# Patient Record
Sex: Female | Born: 1935
Health system: Southern US, Community
[De-identification: ages and names within clinical notes are randomized; demographics above are authoritative.]

## PROBLEM LIST (undated history)

## (undated) ENCOUNTER — Emergency Department (HOSPITAL_BASED_OUTPATIENT_CLINIC_OR_DEPARTMENT_OTHER): Admission: EM | Payer: Self-pay | Source: Home / Self Care

## (undated) DIAGNOSIS — E559 Vitamin D deficiency, unspecified: Secondary | ICD-10-CM

## (undated) DIAGNOSIS — E119 Type 2 diabetes mellitus without complications: Secondary | ICD-10-CM

## (undated) DIAGNOSIS — D497 Neoplasm of unspecified behavior of endocrine glands and other parts of nervous system: Secondary | ICD-10-CM

## (undated) DIAGNOSIS — E0500 Thyrotoxicosis with diffuse goiter without thyrotoxic crisis or storm: Secondary | ICD-10-CM

## (undated) DIAGNOSIS — M1990 Unspecified osteoarthritis, unspecified site: Secondary | ICD-10-CM

## (undated) DIAGNOSIS — G4700 Insomnia, unspecified: Secondary | ICD-10-CM

## (undated) DIAGNOSIS — I10 Essential (primary) hypertension: Secondary | ICD-10-CM

## (undated) DIAGNOSIS — I639 Cerebral infarction, unspecified: Secondary | ICD-10-CM

## (undated) HISTORY — PX: NASAL SEPTUM SURGERY: SHX37

## (undated) HISTORY — PX: APPENDECTOMY: SHX54

## (undated) HISTORY — DX: Vitamin D deficiency, unspecified: E55.9

## (undated) HISTORY — DX: Type 2 diabetes mellitus without complications: E11.9

## (undated) HISTORY — DX: Insomnia, unspecified: G47.00

## (undated) HISTORY — DX: Cerebral infarction, unspecified: I63.9

## (undated) HISTORY — DX: Thyrotoxicosis with diffuse goiter without thyrotoxic crisis or storm: E05.00

---

## 1997-08-27 ENCOUNTER — Other Ambulatory Visit: Admission: RE | Admit: 1997-08-27 | Discharge: 1997-08-27 | Payer: Self-pay | Admitting: Obstetrics and Gynecology

## 1998-02-27 DIAGNOSIS — E0500 Thyrotoxicosis with diffuse goiter without thyrotoxic crisis or storm: Secondary | ICD-10-CM

## 1998-02-27 HISTORY — DX: Thyrotoxicosis with diffuse goiter without thyrotoxic crisis or storm: E05.00

## 2012-02-06 ENCOUNTER — Emergency Department (HOSPITAL_BASED_OUTPATIENT_CLINIC_OR_DEPARTMENT_OTHER)
Admission: EM | Admit: 2012-02-06 | Discharge: 2012-02-06 | Disposition: A | Discharge status: Home / Self Care | Payer: Medicare Other | Attending: Emergency Medicine | Admitting: Emergency Medicine

## 2012-02-06 ENCOUNTER — Encounter (HOSPITAL_BASED_OUTPATIENT_CLINIC_OR_DEPARTMENT_OTHER): Payer: Self-pay | Admitting: *Deleted

## 2012-02-06 ENCOUNTER — Emergency Department (HOSPITAL_BASED_OUTPATIENT_CLINIC_OR_DEPARTMENT_OTHER): Payer: Medicare Other

## 2012-02-06 DIAGNOSIS — R6889 Other general symptoms and signs: Secondary | ICD-10-CM | POA: Insufficient documentation

## 2012-02-06 DIAGNOSIS — M129 Arthropathy, unspecified: Secondary | ICD-10-CM | POA: Insufficient documentation

## 2012-02-06 DIAGNOSIS — R059 Cough, unspecified: Secondary | ICD-10-CM | POA: Insufficient documentation

## 2012-02-06 DIAGNOSIS — J029 Acute pharyngitis, unspecified: Secondary | ICD-10-CM | POA: Insufficient documentation

## 2012-02-06 DIAGNOSIS — J069 Acute upper respiratory infection, unspecified: Secondary | ICD-10-CM

## 2012-02-06 DIAGNOSIS — R498 Other voice and resonance disorders: Secondary | ICD-10-CM | POA: Insufficient documentation

## 2012-02-06 DIAGNOSIS — Z862 Personal history of diseases of the blood and blood-forming organs and certain disorders involving the immune mechanism: Secondary | ICD-10-CM | POA: Insufficient documentation

## 2012-02-06 DIAGNOSIS — Z8639 Personal history of other endocrine, nutritional and metabolic disease: Secondary | ICD-10-CM | POA: Insufficient documentation

## 2012-02-06 DIAGNOSIS — R05 Cough: Secondary | ICD-10-CM | POA: Insufficient documentation

## 2012-02-06 DIAGNOSIS — I10 Essential (primary) hypertension: Secondary | ICD-10-CM | POA: Insufficient documentation

## 2012-02-06 DIAGNOSIS — R0982 Postnasal drip: Secondary | ICD-10-CM | POA: Insufficient documentation

## 2012-02-06 HISTORY — DX: Unspecified osteoarthritis, unspecified site: M19.90

## 2012-02-06 HISTORY — DX: Essential (primary) hypertension: I10

## 2012-02-06 MED ORDER — AZITHROMYCIN 250 MG PO TABS: 250.0000 mg | ORAL_TABLET | Freq: Every day | ORAL | Status: DC

## 2012-02-06 MED ORDER — OXYMETAZOLINE HCL 0.05 % NA SOLN
1.0000 | Freq: Once | NASAL | Status: AC
  Administered 2012-02-06: 1 via NASAL
  Filled 2012-02-06: qty 15

## 2012-02-06 MED ORDER — GUAIFENESIN ER 600 MG PO TB12: 1200.0000 mg | ORAL_TABLET | Freq: Two times a day (BID) | ORAL | Status: DC

## 2012-02-06 MED ORDER — HYDROCODONE-HOMATROPINE 5-1.5 MG/5ML PO SYRP: 5.0000 mL | ORAL_SOLUTION | Freq: Four times a day (QID) | ORAL | Status: DC | PRN

## 2012-02-06 MED ORDER — IPRATROPIUM BROMIDE 0.03 % NA SOLN: 2.0000 | Freq: Two times a day (BID) | NASAL | Status: DC

## 2012-02-06 NOTE — ED Provider Notes (Signed)
Medical screening examination/treatment/procedure(s) were performed by non-physician practitioner and as supervising physician I was immediately available for consultation/collaboration.    Charles B. Sheldon, MD 02/06/12 2326 

## 2012-02-06 NOTE — ED Provider Notes (Signed)
History     CSN: [id number]  Arrival date & time 02/06/12  1802   First MD Initiated Contact with Patient 02/06/12 1859      Chief Complaint  Patient presents with  . Nasal Congestion    (Consider location/radiation/quality/duration/timing/severity/associated sxs/prior treatment) The history is provided by the patient and medical records.    Amanda Curtis is a 76 y.o. female  with a hx of HTN presents to the Emergency Department complaining of gradual, persistent, progressively worsening cough onset 1 week ago.  Associated symptoms include cough, nasal congestion, sore throat, sneezing, runny nose, sinus pressure.  Nothing makes it better and nothing  makes it worse.  Pt denies fever, chills, headache, chest pain, shortness of breath, abdominal pain, nausea, vomiting, diarrhea, weakness, dizziness, syncope, dysuria, hematuria, urgency, frequency.  Pt has been taking mucinex without relief.         Past Medical History  Diagnosis Date  . Hypertension   . Thyroid disease   . Arthritis     Past Surgical History  Procedure Date  . Appendectomy     No family history on file.  History  Substance Use Topics  . Smoking status: Never Smoker   . Smokeless tobacco: Not on file  . Alcohol Use: No    OB History    Grav Para Term Preterm Abortions TAB SAB Ect Mult Living                  Review of Systems  Constitutional: Negative for fever, diaphoresis, appetite change, fatigue and unexpected weight change.  HENT: Positive for congestion, sore throat, rhinorrhea, sneezing, voice change, postnasal drip and sinus pressure. Negative for ear pain, mouth sores, neck pain and neck stiffness.   Eyes: Negative for visual disturbance.  Respiratory: Positive for cough. Negative for chest tightness, shortness of breath and wheezing.   Cardiovascular: Negative for chest pain.  Gastrointestinal: Negative for nausea, vomiting, abdominal pain, diarrhea and constipation.  Genitourinary:  Negative for dysuria, urgency, frequency and hematuria.  Musculoskeletal: Negative for back pain.  Skin: Negative for rash.  Neurological: Negative for syncope, weakness, light-headedness, numbness and headaches.  Hematological: Does not bruise/bleed easily.  Psychiatric/Behavioral: Negative for sleep disturbance. The patient is not nervous/anxious.   All other systems reviewed and are negative.    Allergies  Erythromycin  Home Medications   Current Outpatient Rx  Name  Route  Sig  Dispense  Refill  . AZITHROMYCIN 250 MG PO TABS   Oral   Take 1 tablet (250 mg total) by mouth daily. Take first 2 tablets together, then 1 every day until finished.   6 tablet   0   . GUAIFENESIN ER 600 MG PO TB12   Oral   Take 2 tablets (1,200 mg total) by mouth 2 (two) times daily.   20 tablet   0   . HYDROCODONE-HOMATROPINE 5-1.5 MG/5ML PO SYRP   Oral   Take 5 mLs by mouth every 6 (six) hours as needed for cough.   120 mL   0   . IPRATROPIUM BROMIDE 0.03 % NA SOLN   Nasal   Place 2 sprays into the nose 2 (two) times daily. PRN congestion   30 mL   0     BP 160/80  Pulse 84  Temp 98.6 F (37 C) (Oral)  Resp 16  SpO2 100%  Physical Exam  Nursing note and vitals reviewed. Constitutional: She is oriented to person, place, and time. She appears well-developed and well-nourished. No  distress.  HENT:  Head: Normocephalic and atraumatic.  Left Ear: Tympanic membrane, external ear and ear canal normal.  Nose: Mucosal edema and rhinorrhea present. No sinus tenderness. No epistaxis.  Mouth/Throat: Uvula is midline, oropharynx is clear and moist and mucous membranes are normal. No oropharyngeal exudate, posterior oropharyngeal edema, posterior oropharyngeal erythema or tonsillar abscesses.       Cerumen impaction in the right ear  Eyes: Conjunctivae normal are normal. No scleral icterus.  Neck: Normal range of motion. Neck supple.  Cardiovascular: Normal rate, regular rhythm, normal  heart sounds and intact distal pulses.  Exam reveals no gallop and no friction rub.   No murmur heard. Pulmonary/Chest: Effort normal and breath sounds normal. No respiratory distress. She has no wheezes. She has no rales. She exhibits no tenderness.  Abdominal: Soft. Bowel sounds are normal. She exhibits no distension and no mass. There is no tenderness. There is no rebound and no guarding.  Musculoskeletal: Normal range of motion. She exhibits no edema and no tenderness.  Lymphadenopathy:    She has no cervical adenopathy.  Neurological: She is alert and oriented to person, place, and time. She exhibits normal muscle tone. Coordination normal.       Speech is clear and goal oriented Moves extremities without ataxia  Skin: Skin is warm and dry. No rash noted. She is not diaphoretic. No erythema.  Psychiatric: She has a normal mood and affect.    ED Course  Procedures (including critical care time)  Labs Reviewed - No data to display Dg Chest 2 View  02/06/2012  *RADIOLOGY REPORT*  Clinical Data: Short of breath.  Cough and congestion.  CHEST - 2 VIEW  Comparison: None.  Findings: Cardiopericardial silhouette appears within normal limits.  There is no airspace disease.  No effusion.  Mild thoracic spondylosis is present.  Mediastinal contours are within normal limits.  IMPRESSION: No acute cardiopulmonary disease.   Original Report Authenticated By: Andreas Newport, M.D.      1. URI (upper respiratory infection)       MDM  [facility] presents with URI symptoms.  Pt CXR negative for acute infiltrate. Patients symptoms are consistent with URI, likely viral etiology. Discussed that antibiotics are not indicated for viral infections. Pt will be discharged with symptomatic treatment.  Verbalizes understanding and is agreeable with plan. Pt is hemodynamically stable & in NAD prior to dc.  1. Medications: atrovent, mucinex, hycodan usual home medications 2. Treatment: rest, drink plenty of  fluids, take medications as prescribed 3. Follow Up: Please followup with your primary doctor for discussion of your diagnoses and further evaluation after today's visit; if you do not have a primary care doctor use the resource guide provided to find one;        Dierdre Forth, PA-C 02/06/12 2227

## 2012-02-06 NOTE — ED Notes (Signed)
Nasal congestion, cough, laryngitis x 1 week. Has been taking Muccinex without relief.

## 2013-02-07 ENCOUNTER — Telehealth: Payer: Self-pay | Admitting: Family

## 2013-02-07 ENCOUNTER — Encounter: Payer: Self-pay | Admitting: Family

## 2013-02-07 ENCOUNTER — Ambulatory Visit (INDEPENDENT_AMBULATORY_CARE_PROVIDER_SITE_OTHER): Payer: Medicare Other | Admitting: Family

## 2013-02-07 VITALS — BP 180/90 | HR 56 | Temp 97.5°F | Resp 16 | Ht 65.5 in | Wt 159.1 lb

## 2013-02-07 DIAGNOSIS — E039 Hypothyroidism, unspecified: Secondary | ICD-10-CM

## 2013-02-07 DIAGNOSIS — I10 Essential (primary) hypertension: Secondary | ICD-10-CM

## 2013-02-07 DIAGNOSIS — Z8639 Personal history of other endocrine, nutritional and metabolic disease: Secondary | ICD-10-CM

## 2013-02-07 DIAGNOSIS — W57XXXA Bitten or stung by nonvenomous insect and other nonvenomous arthropods, initial encounter: Secondary | ICD-10-CM

## 2013-02-07 DIAGNOSIS — T148 Other injury of unspecified body region: Secondary | ICD-10-CM

## 2013-02-07 DIAGNOSIS — G4700 Insomnia, unspecified: Secondary | ICD-10-CM

## 2013-02-07 DIAGNOSIS — E119 Type 2 diabetes mellitus without complications: Secondary | ICD-10-CM

## 2013-02-07 DIAGNOSIS — Z862 Personal history of diseases of the blood and blood-forming organs and certain disorders involving the immune mechanism: Secondary | ICD-10-CM

## 2013-02-07 LAB — LIPID PANEL
HDL: 46 mg/dL (ref >39)
LDL Cholesterol: 156 mg/dL — ABNORMAL HIGH (ref 0–99)
Total CHOL/HDL Ratio: 5.3 Ratio
Triglycerides: 205 mg/dL — ABNORMAL HIGH (ref <150)
VLDL: 41 mg/dL — ABNORMAL HIGH (ref 0–40)

## 2013-02-07 LAB — BASIC METABOLIC PANEL WITH GFR
CO2: 26 mEq/L (ref 19–32)
Chloride: 105 mEq/L (ref 96–112)
Glucose, Bld: 99 mg/dL (ref 70–99)
Potassium: 5.1 mEq/L (ref 3.5–5.3)
Sodium: 139 mEq/L (ref 135–145)

## 2013-02-07 LAB — HEPATIC FUNCTION PANEL
ALT: 12 U/L (ref 0–35)
Alkaline Phosphatase: 83 U/L (ref 39–117)
Bilirubin, Direct: 0.1 mg/dL (ref 0.0–0.3)
Indirect Bilirubin: 0.3 mg/dL (ref 0.0–0.9)

## 2013-02-07 MED ORDER — AMLODIPINE BESYLATE 5 MG PO TABS: 5.0000 mg | ORAL_TABLET | Freq: Every day | ORAL | Status: DC

## 2013-02-07 MED ORDER — AMITRIPTYLINE HCL 25 MG PO TABS: 25.0000 mg | ORAL_TABLET | Freq: Every day | ORAL | Status: DC

## 2013-02-07 NOTE — Patient Instructions (Signed)
Please complete lab work prior to leaving. Follow up in 1 month for Medicare wellness visit. Welcome to [facility]!

## 2013-02-07 NOTE — Assessment & Plan Note (Signed)
Uncontrolled.  Add amlodipine, continue ARB. Obtain bmet.

## 2013-02-07 NOTE — Assessment & Plan Note (Signed)
Due for eye exam.  Will refer.  Continue tradjenta. Check A1C, urine microalbumin.

## 2013-02-07 NOTE — Telephone Encounter (Signed)
Prior auth form received onn amitriptyline hcl 25mg  Take 1 tab at bedtime Qty 30 PA form sent to nurse

## 2013-02-07 NOTE — Assessment & Plan Note (Signed)
Trial of elavil for insomnia and depression

## 2013-02-07 NOTE — Progress Notes (Signed)
Subjective:    Patient ID: Amanda Curtis, female    DOB: 1935/03/14, 77 y.o.   MRN: [id number]  HPI  Amanda Curtis is a 77 yr old female who presents today to establish care. She lived in [place]) for the last 15 years and is now returning to the area.  Insomnia  HTN- maintained on losartan 100 mg.  Reports BP is high "all the time."  She does not check her blood pressure at home.  Occasionally checks at drug stores.  Reports that when she checks there SBP runs 140-150's.  Reports that she has   DM2- reports that she was diagnosed 2 years ago.  Insomnia/depression- has tried [place] which did help her to sleep. Then this was discontinued due to her age and concerns re: increased fall risk.Tried zoloft which she did not tolerate.    Hypothyroid- Hx of grave's disease. S/p RIA- around 2000. Requests "severe hair loss." has started to grow back some.   Tick bite- reports tick bite between her breast.  Larey Seat off he chest, thinks it was a tick.   Review of Systems  Constitutional:       Has gained 10 pounds in last 6 months  HENT: Positive for rhinorrhea. Negative for postnasal drip.   Respiratory: Negative for cough and shortness of breath.   Cardiovascular: Negative for leg swelling.  Gastrointestinal: Negative for constipation.  Genitourinary: Negative for dysuria.  Musculoskeletal:       Notes bilateral leg soreness attributes to recent physical activity.    Skin:       Insect bite on chest  Neurological:       Reports mild headaches since starting trajenta  Psychiatric/Behavioral:       Reports stress, recent move, feels somewhat depressed. Feels lonely.     Past Medical History  Diagnosis Date  . Hypertension   . Arthritis   . Graves disease 2000    s/p RIA  . Diabetes mellitus without complication   . Insomnia     History   Social History  . Marital Status: Widowed    Spouse Name: N/A    Number of Children: N/A  . Years of Education: N/A    Occupational History  . Not on file.   Social History Main Topics  . Smoking status: Never Smoker   . Smokeless tobacco: Never Used  . Alcohol Use: Yes     Comment: 1 glass wine every 6 months  . Drug Use: No  . Sexual Activity: Not on file   Other Topics Concern  . Not on file   Social History Narrative   Widow   Retired worked for [employer] and did a lot of travelling for [employer].   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   She has 1 son- lives in [place]   3 grandsons- college age (one is in [place])             Past Surgical History  Procedure Laterality Date  . Appendectomy    . Nasal septum surgery      childhood    Family History  Problem Relation Age of Onset  . Dementia Mother   . Cancer Mother     breast  . Diabetes Father   . Diabetes Paternal Grandmother   . Hypertension Paternal Grandmother     Allergies  Allergen Reactions  . Erythromycin Rash  No current outpatient prescriptions on file prior to visit.   No current facility-administered medications on file prior to visit.    BP 180/90  Pulse 56  Temp(Src) 97.5 F (36.4 C) (Oral)  Resp 16  Ht 5' 5.5" (1.664 m)  Wt 159 lb 1.9 oz (72.176 kg)  BMI 26.07 kg/m2  SpO2 99%       Objective:   Physical Exam  Constitutional: She is oriented to person, place, and time. She appears well-developed and well-nourished. No distress.  HENT:  Head: Normocephalic and atraumatic.  Eyes: No scleral icterus.  Neck: No thyromegaly present.  Cardiovascular: Normal rate and regular rhythm.   No murmur heard. Pulmonary/Chest: Effort normal and breath sounds normal. No respiratory distress. She has no wheezes. She has no rales. She exhibits no tenderness.  Musculoskeletal: She exhibits no edema.  Lymphadenopathy:    She has no cervical adenopathy.  Neurological: She is alert and oriented to person, place, and  time.  Skin: Skin is warm and dry.  Thin hair noted on head Small round <1cm wide circular erythema lesion noted between breasts.  Small "bite" mark in center.  Psychiatric: She has a normal mood and affect. Her behavior is normal. Judgment and thought content normal.          Assessment & Plan:

## 2013-02-07 NOTE — Progress Notes (Signed)
Pre visit review using our clinic review tool, if applicable. No additional management support is needed unless otherwise documented below in the visit note. 

## 2013-02-07 NOTE — Assessment & Plan Note (Signed)
No sign of infection.  Pt advised to contact us if she develops joint pain, rash, fever.

## 2013-02-07 NOTE — Assessment & Plan Note (Signed)
Continue synthroid, obtain tsh.  

## 2013-02-08 LAB — MICROALBUMIN / CREATININE URINE RATIO: Creatinine, Urine: 121.1 mg/dL

## 2013-02-08 NOTE — Telephone Encounter (Signed)
Sugar is at goal.   

## 2013-02-10 MED ORDER — SYNTHROID 100 MCG PO TABS: 100.0000 ug | ORAL_TABLET | Freq: Every day | ORAL | Status: DC

## 2013-02-10 MED ORDER — LEVOTHYROXINE SODIUM 100 MCG PO TABS: 100.0000 ug | ORAL_TABLET | Freq: Every day | ORAL | Status: DC

## 2013-02-10 MED ORDER — ATORVASTATIN CALCIUM 20 MG PO TABS: 20.0000 mg | ORAL_TABLET | Freq: Every day | ORAL | Status: DC

## 2013-02-10 NOTE — Telephone Encounter (Signed)
Could you please send her copy of the Dash Diet. Thanks.

## 2013-02-10 NOTE — Telephone Encounter (Signed)
Notified pt and she states that she has not tolerated statins in the past as they have made her very weak and tired. Pt does not want to start it at this time and would like recommendations for a diet.  Also states that she has not tolerated the generic synthroid as well as the name brand due to increase hair loss while on the generic.  Called pharmacy and asked them to change rx to brand name. Lab order entered.  PA request has been faxed to [phone].  Pt aware.  Please advise.

## 2013-02-10 NOTE — Telephone Encounter (Signed)
Lab work shows that synthroid dose a bit too high. I am decreasing from 112 to 100 mcg once daily.  Kidney function is normal.  Cholesterol is high. Recommend that she start statin.  rx sent for atorvastatin. Follow up in 6 weeks for TSH (dx hypothyroid), FLP/LFT (dx hyperlipidemia)

## 2013-02-10 NOTE — Telephone Encounter (Signed)
Notified pt of P.A. Process. Form completed and forwarded to Provider for signature.

## 2013-02-12 MED ORDER — AMITRIPTYLINE HCL 25 MG PO TABS: 25.0000 mg | ORAL_TABLET | Freq: Every day | ORAL | Status: DC

## 2013-02-12 NOTE — Telephone Encounter (Signed)
Insurance denied PA, forward to nurse

## 2013-02-12 NOTE — Telephone Encounter (Signed)
Notified pt and she voices understanding. Rx sent to [facility] on [address]. DASH diet mailed to pt.

## 2013-02-12 NOTE — Telephone Encounter (Signed)
Received notice from [facility] that amitriptyline is denied since pt has not tried trazodone. Please adivse.

## 2013-02-12 NOTE — Telephone Encounter (Signed)
I would recommend that she pay cash for elavil at [facility] as it is on the $4 plan. OK to send rx to [facility] of her choice.

## 2013-03-12 ENCOUNTER — Ambulatory Visit: Payer: Medicare Other | Admitting: Family

## 2013-03-19 ENCOUNTER — Ambulatory Visit: Payer: Medicare Other | Admitting: Family

## 2013-03-25 ENCOUNTER — Ambulatory Visit (INDEPENDENT_AMBULATORY_CARE_PROVIDER_SITE_OTHER): Payer: Medicare Other | Admitting: Family

## 2013-03-25 ENCOUNTER — Encounter: Payer: Self-pay | Admitting: Family

## 2013-03-25 VITALS — BP 180/104 | HR 58 | Temp 98.4°F | Resp 16 | Ht 65.5 in | Wt 160.0 lb

## 2013-03-25 DIAGNOSIS — I10 Essential (primary) hypertension: Secondary | ICD-10-CM

## 2013-03-25 DIAGNOSIS — M8580 Other specified disorders of bone density and structure, unspecified site: Secondary | ICD-10-CM

## 2013-03-25 DIAGNOSIS — Z1239 Encounter for other screening for malignant neoplasm of breast: Secondary | ICD-10-CM

## 2013-03-25 DIAGNOSIS — Z1231 Encounter for screening mammogram for malignant neoplasm of breast: Secondary | ICD-10-CM

## 2013-03-25 DIAGNOSIS — M949 Disorder of cartilage, unspecified: Secondary | ICD-10-CM

## 2013-03-25 DIAGNOSIS — Z0000 Encounter for general adult medical examination without abnormal findings: Secondary | ICD-10-CM | POA: Insufficient documentation

## 2013-03-25 DIAGNOSIS — Z1211 Encounter for screening for malignant neoplasm of colon: Secondary | ICD-10-CM

## 2013-03-25 DIAGNOSIS — M899 Disorder of bone, unspecified: Secondary | ICD-10-CM

## 2013-03-25 DIAGNOSIS — R9431 Abnormal electrocardiogram [ECG] [EKG]: Secondary | ICD-10-CM

## 2013-03-25 DIAGNOSIS — E039 Hypothyroidism, unspecified: Secondary | ICD-10-CM

## 2013-03-25 DIAGNOSIS — G4700 Insomnia, unspecified: Secondary | ICD-10-CM

## 2013-03-25 DIAGNOSIS — E119 Type 2 diabetes mellitus without complications: Secondary | ICD-10-CM

## 2013-03-25 NOTE — Assessment & Plan Note (Signed)
Stable on tradjenta.

## 2013-03-25 NOTE — Assessment & Plan Note (Signed)
Discussed healthy diet, exercise. Refer for mammogram, colo, mammo- declines pap smear.

## 2013-03-25 NOTE — Assessment & Plan Note (Signed)
Intolerant to elavil- had tongue swelling.

## 2013-03-25 NOTE — Assessment & Plan Note (Signed)
Uncontrolled, add amlodipine, reinforced importance of med compliance.

## 2013-03-25 NOTE — Assessment & Plan Note (Signed)
Check follow up TSH

## 2013-03-25 NOTE — Assessment & Plan Note (Signed)
EKG notes ? Old infarct. Notes neck discomfort/pressure with exertion. Will refer for stress test.

## 2013-03-25 NOTE — Progress Notes (Signed)
Subjective:    Patient ID: Amanda Curtis, female    DOB: 09-02-1935, 78 y.o.   MRN: [id number]  HPI  Amanda Curtis is a 78 yr old female who presents today for medicare wellness visit.   Subjective:   Patient here for Medicare annual wellness visit and management of other chronic and acute problems.  HTN-  Last visit, amlodipine was added to her regimen.  She did not start this medication. Reports that she continues to have some dizziness on the losartan.   DM2-  A1C was at goal at 6.6.  She is maintained on tradjenta  Hypothyroid- She reports that she is taking the recommended dose but now feels "cold all the time" notes some hair loss.  Lab Results  Component Value Date   TSH 0.249* 02/07/2013  Hyperlipidemia-   LDL 156 last visit. Declined statin due to statin intolerance. Reports that she is trying hard on her diet but admits that she needs to get back to exercising.   Immunizations: declines flu and tetanus vaccines Diet:working on low fat diet Exercise: not exercising Colonoscopy: due- agreeable to proceed Dexa: due  Pap Smear: years ago- done by GYN.  She declines pap smear. Mammogram: due  Insomnia- did not tolerated elavil due to side effects.    Risk factors: HTN  Roster of Physicians Providing Medical Care to Patient:  Activities of Daily Living  In your present state of health, do you have any difficulty performing the following activities? Preparing food and eating?: No  Bathing yourself: No  Getting dressed: No  Using the toilet:No  Moving around from place to place: No  In the past year have you fallen or had a near fall?:No    Home Safety: No smoke detector in home- advised pt to purchase and install ASAP.  Does wears seat belts. No firearms. No excess sun exposure.  Diet and Exercise  Current exercise habits: none Dietary issues discussed: healthy diet   Depression Screen  (Note: if answer to either of the following is "Yes", then a more complete  depression screening is indicated)  Q1: Over the past two weeks, have you felt down, depressed or hopeless?no- reports she gets down sometimes. Took zoloft in the past, did not help her.  Reports sadness sometimes.  Gardening and exercise help her.    Q2: Over the past two weeks, have you felt little interest or pleasure in doing things? no   The following portions of the patient's history were reviewed and updated as appropriate: allergies, current medications, past family history, past medical history, past social history, past surgical history and problem list.   Objective:   Vision: see nursing Hearing: able to hear forced whisper at 6 feet.  Body mass index:  Body mass index is 26.22 kg/(m^2). Cognitive Impairment Assessment: cognition, memory and judgment appear normal.   Assessment:   Medicare wellness utd on preventive parameters   Plan:   During the course of the visit the patient was educated and counseled about appropriate screening and preventive services including:       Fall prevention   Screening mammography  Bone densitometry screening   Vaccines / LABS Zostavax / Pnemonccoal Vaccine  today  PSA  Patient Instructions (the written plan) was given to the patient.      Review of Systems See HPI  Past Medical History  Diagnosis Date  . Hypertension   . Arthritis   . Graves disease 2000    s/p RIA  .  Diabetes mellitus without complication   . Insomnia     History   Social History  . Marital Status: Widowed    Spouse Name: Amanda Curtis    Number of Children: Amanda Curtis  . Years of Education: Amanda Curtis   Occupational History  . Not on file.   Social History Main Topics  . Smoking status: Never Smoker   . Smokeless tobacco: Never Used  . Alcohol Use: Yes     Comment: 1 glass wine every 6 months  . Drug Use: No  . Sexual Activity: Not on file   Other Topics Concern  . Not on file   Social History Narrative   Widow   Retired worked for [employer] and did a lot of  travelling for [employer].   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   She has 1 son- lives in [place]   3 grandsons- college age (one is in [place])             Past Surgical History  Procedure Laterality Date  . Appendectomy    . Nasal septum surgery      childhood    Family History  Problem Relation Age of Onset  . Dementia Mother   . Cancer Mother     breast  . Diabetes Father   . Diabetes Paternal Grandmother   . Hypertension Paternal Grandmother     Allergies  Allergen Reactions  . Atorvastatin     Weakness, fatigue  . Elavil [Amitriptyline] Other (See Comments)    Tongue swelling  . Erythromycin Rash    Current Outpatient Prescriptions on File Prior to Visit  Medication Sig Dispense Refill  . losartan (COZAAR) 100 MG tablet Take 100 mg by mouth daily.      [name] SYNTHROID 100 MCG tablet Take 1 tablet (100 mcg total) by mouth daily before breakfast.  30 tablet  1  . TRADJENTA 5 MG TABS tablet Take 5 mg by mouth daily.      [name] amLODipine (NORVASC) 5 MG tablet Take 1 tablet (5 mg total) by mouth daily.  30 tablet  1   No current facility-administered medications on file prior to visit.    BP 180/104  Pulse 58  Temp(Src) 98.4 F (36.9 C) (Oral)  Resp 16  Ht 5' 5.5" (1.664 m)  Wt 160 lb 0.6 oz (72.594 kg)  BMI 26.22 kg/m2  SpO2 99%       Objective:   Physical Exam  Constitutional: She is oriented to person, place, and time. She appears well-developed and well-nourished. No distress.  HENT:  Head: Normocephalic and atraumatic.  Right Ear: Tympanic membrane and ear canal normal.  Left Ear: Tympanic membrane and ear canal normal.  Mouth/Throat: No oropharyngeal exudate, posterior oropharyngeal edema or posterior oropharyngeal erythema.  Cardiovascular: Normal rate and regular rhythm.   No murmur heard. Pulmonary/Chest: Effort normal and breath sounds normal. No  respiratory distress. She has no wheezes. She has no rales. She exhibits no tenderness.  Abdominal: Soft. Bowel sounds are normal.  Neurological: She is alert and oriented to person, place, and time.  Skin: Skin is warm and dry.  Psychiatric: She has a normal mood and affect. Her behavior is normal. Judgment and thought content normal.          Assessment & Plan:

## 2013-03-25 NOTE — Patient Instructions (Signed)
Please complete lab work. Lab opens back up at 1:30PM. Schedule mammogram on the first floor and bone density at the front desk. Start amlodipine in addition to your losartan. You will be contacted about your referral for colonoscopy and for stress test.   Follow up in 1 month.

## 2013-03-26 ENCOUNTER — Encounter: Payer: Self-pay | Admitting: Family

## 2013-03-26 LAB — TSH: TSH: 1.646 u[IU]/mL (ref 0.350–4.500)

## 2013-03-27 ENCOUNTER — Telehealth: Payer: Self-pay | Admitting: Family

## 2013-03-27 ENCOUNTER — Telehealth: Payer: Self-pay

## 2013-03-27 NOTE — Telephone Encounter (Signed)
Relevant patient education mailed to patient.  

## 2013-03-27 NOTE — Telephone Encounter (Signed)
Having some left sided dull/burning chest pain at night, pt believes that amLODipine. Should she continue?

## 2013-03-28 ENCOUNTER — Emergency Department (HOSPITAL_BASED_OUTPATIENT_CLINIC_OR_DEPARTMENT_OTHER)
Admission: EM | Admit: 2013-03-28 | Discharge: 2013-03-28 | Disposition: A | Discharge status: Home / Self Care | Payer: Medicare Other | Attending: Emergency Medicine | Admitting: Emergency Medicine

## 2013-03-28 ENCOUNTER — Encounter (HOSPITAL_BASED_OUTPATIENT_CLINIC_OR_DEPARTMENT_OTHER): Payer: Self-pay | Admitting: Emergency Medicine

## 2013-03-28 DIAGNOSIS — R42 Dizziness and giddiness: Secondary | ICD-10-CM | POA: Insufficient documentation

## 2013-03-28 DIAGNOSIS — Z79899 Other long term (current) drug therapy: Secondary | ICD-10-CM | POA: Insufficient documentation

## 2013-03-28 DIAGNOSIS — Z8739 Personal history of other diseases of the musculoskeletal system and connective tissue: Secondary | ICD-10-CM | POA: Insufficient documentation

## 2013-03-28 DIAGNOSIS — R5383 Other fatigue: Secondary | ICD-10-CM

## 2013-03-28 DIAGNOSIS — R079 Chest pain, unspecified: Secondary | ICD-10-CM | POA: Insufficient documentation

## 2013-03-28 DIAGNOSIS — R5381 Other malaise: Secondary | ICD-10-CM | POA: Insufficient documentation

## 2013-03-28 DIAGNOSIS — I10 Essential (primary) hypertension: Secondary | ICD-10-CM | POA: Insufficient documentation

## 2013-03-28 DIAGNOSIS — E119 Type 2 diabetes mellitus without complications: Secondary | ICD-10-CM | POA: Insufficient documentation

## 2013-03-28 LAB — CBC WITH DIFFERENTIAL/PLATELET
BASOS ABS: 0.1 10*3/uL (ref 0.0–0.1)
BASOS PCT: 1 % (ref 0–1)
EOS ABS: 0.1 10*3/uL (ref 0.0–0.7)
EOS PCT: 1 % (ref 0–5)
HCT: 45.7 % (ref 36.0–46.0)
Hemoglobin: 14.6 g/dL (ref 12.0–15.0)
LYMPHS ABS: 2.7 10*3/uL (ref 0.7–4.0)
Lymphocytes Relative: 24 % (ref 12–46)
MCH: 30.4 pg (ref 26.0–34.0)
MCHC: 31.9 g/dL (ref 30.0–36.0)
MCV: 95 fL (ref 78.0–100.0)
Monocytes Absolute: 0.8 10*3/uL (ref 0.1–1.0)
Monocytes Relative: 8 % (ref 3–12)
NEUTROS PCT: 67 % (ref 43–77)
Neutro Abs: 7.6 10*3/uL (ref 1.7–7.7)
PLATELETS: 418 10*3/uL — AB (ref 150–400)
RBC: 4.81 MIL/uL (ref 3.87–5.11)
RDW: 14.1 % (ref 11.5–15.5)
WBC: 11.3 10*3/uL — ABNORMAL HIGH (ref 4.0–10.5)

## 2013-03-28 LAB — BASIC METABOLIC PANEL
BUN: 27 mg/dL — ABNORMAL HIGH (ref 6–23)
CALCIUM: 9.4 mg/dL (ref 8.4–10.5)
CO2: 22 meq/L (ref 19–32)
CREATININE: 1.1 mg/dL (ref 0.50–1.10)
Chloride: 98 mEq/L (ref 96–112)
GFR calc Af Amer: 55 mL/min — ABNORMAL LOW (ref >90)
GFR, EST NON AFRICAN AMERICAN: 48 mL/min — AB (ref >90)
GLUCOSE: 96 mg/dL (ref 70–99)
Potassium: 4.6 mEq/L (ref 3.7–5.3)
SODIUM: 136 meq/L — AB (ref 137–147)

## 2013-03-28 LAB — TROPONIN I

## 2013-03-28 NOTE — Telephone Encounter (Signed)
I would advise her to proceed to the ER now. I do not believe that her chest pain is related to amlodipine.

## 2013-03-28 NOTE — Telephone Encounter (Signed)
Spoke with pt. She states she feels dizzy, weak and has noted some pain on the left side of her chest. Took amlodipine in the morning and noted some left side chest pain in the evening. Has not had chest pain every evening but noticed it started after starting medication this month. Pt did not start medication in December when it was prescribed.  States she read the packet insert and it mentioned that amlodipine has been associated with heart problems. Pt denied any associated n/v, shortness of breath or radiating pain.  Please advise?

## 2013-03-28 NOTE — ED Notes (Signed)
Pt. Reports starting new Med   Norvasc and now having chest pain symptoms.

## 2013-03-28 NOTE — ED Provider Notes (Signed)
CSN: [id number]     Arrival date & time 03/28/13  1559 History   First MD Initiated Contact with Patient 03/28/13 1608     Chief Complaint  Patient presents with  . Chest Pain    HPI  Amanda Curtis presents with complaint of dizziness and chest pain. Sutures are followed by a physician in [place] for many years for high blood pressure. She was taking verapamil. She felt well. Her blood pressures were controlled. Since moving to [facility] she is seeing a physician. She started on losartan about a year ago. She's felt well. She had a few readings of her blood pressures. She was started about one week ago on amlodipine. She does not follow her blood pressures at home. She's had some episodes this week for she is felt "just weak, and a little dizzy". She has not been syncopal or presyncopal. She has not been diaphoretic. She has not been nauseated. He does describe a discomfort in her left chest. She states this is been there intermittently for years. He is up-to-date with exertion. Occurs primarily at night. She states she has noticed a little bit more in the last week. She states that it starts in the late afternoon is present all all day until she goes to bed. She had the pain all day yesterday who said this morning with her all day today. She called her physician and was referred here for evaluation. She states it is not sharp or stabbing pain. It is not a pressure. She states occasionally it is a burning. She has not had rash or vesicles or any signs of zoster. She's not since into the skin. No difficulties with breathing. She does not regularly exercise but does not describe any exertional pain pain with ADLs  Past Medical History  Diagnosis Date  . Hypertension   . Arthritis   . Graves disease 2000    s/p RIA  . Diabetes mellitus without complication   . Insomnia    Past Surgical History  Procedure Laterality Date  . Appendectomy    . Nasal septum surgery      childhood   Family History   Problem Relation Age of Onset  . Dementia Mother   . Cancer Mother     breast  . Diabetes Father   . Diabetes Paternal Grandmother   . Hypertension Paternal Grandmother    History  Substance Use Topics  . Smoking status: Never Smoker   . Smokeless tobacco: Never Used  . Alcohol Use: Yes     Comment: 1 glass wine every 6 months   OB History   Grav Para Term Preterm Abortions TAB SAB Ect Mult Living                 Review of Systems  Constitutional: Negative for fever, chills, diaphoresis, appetite change and fatigue.  HENT: Negative for mouth sores, sore throat and trouble swallowing.   Eyes: Negative for visual disturbance.  Respiratory: Negative for cough, chest tightness, shortness of breath and wheezing.        No dyspnea  Cardiovascular: Positive for chest pain.       No palpitations. No pleuritic discomfort. No peripheral edema.  Gastrointestinal: Negative for nausea, vomiting, abdominal pain, diarrhea and abdominal distention.  Endocrine: Negative for polydipsia, polyphagia and polyuria.  Genitourinary: Negative for dysuria, frequency and hematuria.  Musculoskeletal: Negative for gait problem.  Skin: Negative for color change, pallor and rash.  Neurological: Positive for dizziness and weakness. Negative for syncope,  light-headedness and headaches.  Hematological: Does not bruise/bleed easily.  Psychiatric/Behavioral: Negative for behavioral problems and confusion.    Allergies  Atorvastatin; Elavil; and Erythromycin  Home Medications   Current Outpatient Rx  Name  Route  Sig  Dispense  Refill  . amLODipine (NORVASC) 5 MG tablet   Oral   Take 1 tablet (5 mg total) by mouth daily.   30 tablet   1   . losartan (COZAAR) 100 MG tablet   Oral   Take 100 mg by mouth daily.         [name] SYNTHROID 100 MCG tablet   Oral   Take 1 tablet (100 mcg total) by mouth daily before breakfast.   30 tablet   1     Dispense as written.   . TRADJENTA 5 MG TABS tablet    Oral   Take 5 mg by mouth daily.          BP 178/89  Pulse 61  Temp(Src) 98.3 F (36.8 C) (Oral)  Resp 17  Ht 5' 5.5" (1.664 m)  Wt 160 lb (72.576 kg)  BMI 26.21 kg/m2  SpO2 99% Physical Exam  Constitutional: She is oriented to person, place, and time. She appears well-developed and well-nourished. No distress.  HENT:  Head: Normocephalic.  Eyes: Conjunctivae are normal. Pupils are equal, round, and reactive to light. No scleral icterus.  Neck: Normal range of motion. Neck supple. No thyromegaly present.  Cardiovascular: Normal rate and regular rhythm.  Exam reveals no gallop and no friction rub.   No murmur heard. Regular rate and rhythm. As I speak with her I can see occasional unifocal PVCs on the monitor. She is asymptomatic to these.  Pulmonary/Chest: Effort normal and breath sounds normal. No respiratory distress. She has no wheezes. She has no rales.  Her lungs are clear and equal. I see no vesicles or signs of zoster skin sensitivity.  Abdominal: Soft. Bowel sounds are normal. She exhibits no distension. There is no tenderness. There is no rebound.  Musculoskeletal: Normal range of motion.  Neurological: She is alert and oriented to person, place, and time.  Skin: Skin is warm and dry. No rash noted.  Psychiatric: She has a normal mood and affect. Her behavior is normal.    ED Course  Procedures (including critical care time) Labs Review Labs Reviewed  BASIC METABOLIC PANEL - Abnormal; Notable for the following:    Sodium 136 (*)    BUN 27 (*)    GFR calc non Af Amer 48 (*)    GFR calc Af Amer 55 (*)    All other components within normal limits  CBC WITH DIFFERENTIAL - Abnormal; Notable for the following:    WBC 11.3 (*)    Platelets 418 (*)    All other components within normal limits  TROPONIN I   Imaging Review No results found.  EKG Interpretation    Date/Time:  Friday March 28 2013 16:08:47 EST Ventricular Rate:  67 PR Interval:  164 QRS  Duration: 78 QT Interval:  424 QTC Calculation: 448 R Axis:   -20 Text Interpretation:  Normal sinus rhythm No Injury or infarct. No ectopy Confirmed by Jeneen Rinks  MD, Sumner ([id number]) on 03/28/2013 4:46:50 PM            MDM   1. Chest pain     She has a normal EKG. Her pain has been present for 48 hours. I think if her troponin is normal he can safely rule  out acute myocardial injury. Have asked her to take her amlodipine at night. She's been taking amlodipine and will start in the morning. Astra call her physician on Monday to update her on her symptoms. I agree with outpatient stress testing.  17:15:  Troponin is normal. This is after 48 hours of the vague nonspecific discomfort. I think we can safely rule out acute coronary syndrome. Her symptoms did not sound like exertional angina. Patient is appropriate for outpatient treatment. Have asked her to keep her appointments with her physician, and with her stress test. Recheck in emergency room with any worsening symptoms    Tanna Furry, MD 03/28/13 1718

## 2013-03-28 NOTE — Telephone Encounter (Signed)
Notified pt and she voices understanding. Pt also stated when she was contacted about her stress test appt that the caller told her they did not do mammograms at that office. Pt states she was told that we do mammograms in our office. I explained to pt that when she was contacted about the stress test that it was the cardiology office and they do not do mammograms there. We do them in our office at the [facility]. I provided pt with imaging phone number to contact and schedule her mammogram.

## 2013-03-28 NOTE — ED Notes (Signed)
Pt. Reports calling her Dr. Today and felt she needed to be seen due to L chest pain and L arm pain.   Pt. Was told to go to the ER.  Pt. Reports she is to have a stress test on the 11th of Feb.  Her Dr. Michela Pitcher she needed to be seen in the ED due to need for stress test on the 11th and needs to make sure she is not having a cardiac event.

## 2013-03-28 NOTE — Discharge Instructions (Signed)
Take regular aspirin, or 4 baby aspirin every day. Continue your medications. Take losartan in the morning, Vasotec in evening, this may help with your dizziness. Recheck with your physician this week. Keep your appointment for your scheduled outpatient stress test.  Aspirin and Your Heart Aspirin affects the way your blood clots and helps "thin" the blood. Aspirin has many uses in heart disease. It may be used as a primary prevention to help reduce the risk of heart related events. It also can be used as a secondary measure to prevent more heart attacks or to prevent additional damage from blood clots.  ASPIRIN MAY HELP IF YOU:  Have had a heart attack or chest pain.  Have undergone open heart surgery such as CABG (Coronary Artery Bypass Surgery).  Have had coronary angioplasty with or without stents.  Have experienced a stroke or TIA (transient ischemic attack).  Have peripheral vascular disease (PAD).  Have chronic heart rhythm problems such as atrial fibrillation.  Are at risk for heart disease. BEFORE STARTING ASPIRIN Before you start taking aspirin, your caregiver will need to review your medical history. Many things will need to be taken into consideration, such as:  Smoking status.  Blood pressure.  Diabetes.  Gender.  Weight.  Cholesterol level. ASPIRIN DOSES  Aspirin should only be taken on the advice of your caregiver. Talk to your caregiver about how much aspirin you should take. Aspirin comes in different doses such as:  81 mg.  162 mg.  325 mg.  The aspirin dose you take may be affected by many factors, some of which include:  Your current medications, especially if your are taking blood-thinners or anti-platelet medicine.  Liver function.  Heart disease risk.  Age.  Aspirin comes in two forms:  Non-enteric-coated. This type of aspirin does not have a coating and is absorbed faster. Non-enteric coated aspirin is recommended for patients  experiencing chest pain symptoms. This type of aspirin also comes in a chewable form.  Enteric-coated. This means the aspirin has a special coating that releases the medicine very slowly. Enteric-coated aspirin causes less stomach upset. This type of aspirin should not be chewed or crushed. ASPIRIN SIDE EFFECTS Daily use of aspirin can increase your risk of serious side effects. Some of these include:  Increased bleeding. This can range from a cut that does not stop bleeding to more serious problems such as stomach bleeding or bleeding into the brain (Intracerebral bleeding).  Increased bruising.  Stomach upset.  An allergic reaction such as red, itchy skin.  Increased risk of bleeding when combined with non-steroidal anti-inflammatory medicine (NSAIDS).  Alcohol should be drank in moderation when taking aspirin. Alcohol can increase the risk of stomach bleeding when taken with aspirin.  Aspirin should not be given to children less than 70 years of age due to the association of Reye syndrome. Reye syndrome is a serious illness that can affect the brain and liver. Studies have linked Reye syndrome with aspirin use in children.  People that have nasal polyps have an increased risk of developing an aspirin allergy. SEEK MEDICAL CARE IF:   You develop an allergic reaction such as:  Hives.  Itchy skin.  Swelling of the lips, tongue or face.  You develop stomach pain.  You have unusual bleeding or bruising.  You have ringing in your ears. SEEK IMMEDIATE MEDICAL CARE IF:   You have severe chest pain, especially if the pain is crushing or pressure-like and spreads to the arms, back, neck, or  jaw. THIS IS AN EMERGENCY. Do not wait to see if the pain will go away. Get medical help at once. Call your local emergency services (911 in the [place] DO NOT drive yourself to the hospital.  You have stroke-like symptoms such as:  Loss of vision.  Difficulty talking.  Numbness or weakness  on one side of your body.  Numbness or weakness in your arm or leg.  Not thinking clearly or feeling confused.  Your bowel movements are bloody, dark red or black in color.  You vomit or cough up blood.  You have blood in your urine.  You have shortness of breath, coughing or wheezing. MAKE SURE YOU:   Understand these instructions.  Will monitor your condition.  Seek immediate medical care if necessary. Document Released: 01/27/2008 Document Revised: 06/10/2012 Document Reviewed: 01/27/2008 [facility] Patient Information 2014 [place].  Chest Pain (Nonspecific) It is often hard to give a specific diagnosis for the cause of chest pain. There is always a chance that your pain could be related to something serious, such as a heart attack or a blood clot in the lungs. You need to follow up with your caregiver for further evaluation. CAUSES   Heartburn.  Pneumonia or bronchitis.  Anxiety or stress.  Inflammation around your heart (pericarditis) or lung (pleuritis or pleurisy).  A blood clot in the lung.  A collapsed lung (pneumothorax). It can develop suddenly on its own (spontaneous pneumothorax) or from injury (trauma) to the chest.  Shingles infection (herpes zoster virus). The chest wall is composed of bones, muscles, and cartilage. Any of these can be the source of the pain.  The bones can be bruised by injury.  The muscles or cartilage can be strained by coughing or overwork.  The cartilage can be affected by inflammation and become sore (costochondritis). DIAGNOSIS  Lab tests or other studies, such as X-rays, electrocardiography, stress testing, or cardiac imaging, may be needed to find the cause of your pain.  TREATMENT   Treatment depends on what may be causing your chest pain. Treatment may include:  Acid blockers for heartburn.  Anti-inflammatory medicine.  Pain medicine for inflammatory conditions.  Antibiotics if an infection is present.  You  may be advised to change lifestyle habits. This includes stopping smoking and avoiding alcohol, caffeine, and chocolate.  You may be advised to keep your head raised (elevated) when sleeping. This reduces the chance of acid going backward from your stomach into your esophagus.  Most of the time, nonspecific chest pain will improve within 2 to 3 days with rest and mild pain medicine. HOME CARE INSTRUCTIONS   If antibiotics were prescribed, take your antibiotics as directed. Finish them even if you start to feel better.  For the next few days, avoid physical activities that bring on chest pain. Continue physical activities as directed.  Do not smoke.  Avoid drinking alcohol.  Only take over-the-counter or prescription medicine for pain, discomfort, or fever as directed by your caregiver.  Follow your caregiver's suggestions for further testing if your chest pain does not go away.  Keep any follow-up appointments you made. If you do not go to an appointment, you could develop lasting (chronic) problems with pain. If there is any problem keeping an appointment, you must call to reschedule. SEEK MEDICAL CARE IF:   You think you are having problems from the medicine you are taking. Read your medicine instructions carefully.  Your chest pain does not go away, even after treatment.  You  develop a rash with blisters on your chest. SEEK IMMEDIATE MEDICAL CARE IF:   You have increased chest pain or pain that spreads to your arm, neck, jaw, back, or abdomen.  You develop shortness of breath, an increasing cough, or you are coughing up blood.  You have severe back or abdominal pain, feel nauseous, or vomit.  You develop severe weakness, fainting, or chills.  You have a fever. THIS IS AN EMERGENCY. Do not wait to see if the pain will go away. Get medical help at once. Call your local emergency services (911 in [place] Do not drive yourself to the hospital. MAKE SURE YOU:   Understand these  instructions.  Will watch your condition.  Will get help right away if you are not doing well or get worse. Document Released: 11/23/2004 Document Revised: 05/08/2011 Document Reviewed: 09/19/2007 [facility] Patient Information 2014 [place].

## 2013-04-02 ENCOUNTER — Telehealth: Payer: Self-pay | Admitting: Family

## 2013-04-02 NOTE — Telephone Encounter (Signed)
Relevant patient education mailed to patient.  

## 2013-04-04 ENCOUNTER — Encounter: Payer: Self-pay | Admitting: Gastroenterology

## 2013-04-08 ENCOUNTER — Telehealth: Payer: Self-pay | Admitting: *Deleted

## 2013-04-08 DIAGNOSIS — R9431 Abnormal electrocardiogram [ECG] [EKG]: Secondary | ICD-10-CM

## 2013-04-08 NOTE — Telephone Encounter (Signed)
Received message from pt requesting return call re: stress test that has been scheduled. Pt states she does not want to do it.  Please call.

## 2013-04-09 ENCOUNTER — Encounter: Payer: Self-pay | Admitting: *Deleted

## 2013-04-09 ENCOUNTER — Encounter: Payer: Self-pay | Admitting: Cardiovascular Disease

## 2013-04-09 ENCOUNTER — Ambulatory Visit (HOSPITAL_COMMUNITY): Payer: Medicare Other | Attending: Cardiology | Admitting: Radiology

## 2013-04-09 ENCOUNTER — Encounter (HOSPITAL_COMMUNITY): Payer: Self-pay | Admitting: Pharmacy Technician

## 2013-04-09 ENCOUNTER — Ambulatory Visit (INDEPENDENT_AMBULATORY_CARE_PROVIDER_SITE_OTHER): Payer: Medicare Other | Admitting: Cardiovascular Disease

## 2013-04-09 VITALS — BP 153/91 | HR 55 | Ht 65.0 in | Wt 153.0 lb

## 2013-04-09 DIAGNOSIS — R42 Dizziness and giddiness: Secondary | ICD-10-CM | POA: Insufficient documentation

## 2013-04-09 DIAGNOSIS — R079 Chest pain, unspecified: Secondary | ICD-10-CM

## 2013-04-09 DIAGNOSIS — E119 Type 2 diabetes mellitus without complications: Secondary | ICD-10-CM | POA: Insufficient documentation

## 2013-04-09 DIAGNOSIS — I200 Unstable angina: Secondary | ICD-10-CM

## 2013-04-09 DIAGNOSIS — E785 Hyperlipidemia, unspecified: Secondary | ICD-10-CM | POA: Insufficient documentation

## 2013-04-09 DIAGNOSIS — R0789 Other chest pain: Secondary | ICD-10-CM | POA: Insufficient documentation

## 2013-04-09 DIAGNOSIS — R9439 Abnormal result of other cardiovascular function study: Secondary | ICD-10-CM | POA: Insufficient documentation

## 2013-04-09 DIAGNOSIS — R9431 Abnormal electrocardiogram [ECG] [EKG]: Secondary | ICD-10-CM | POA: Insufficient documentation

## 2013-04-09 DIAGNOSIS — I10 Essential (primary) hypertension: Secondary | ICD-10-CM | POA: Insufficient documentation

## 2013-04-09 LAB — CBC WITH DIFFERENTIAL/PLATELET
BASOS PCT: 0.3 % (ref 0.0–3.0)
Basophils Absolute: 0 10*3/uL (ref 0.0–0.1)
EOS PCT: 0.7 % (ref 0.0–5.0)
Eosinophils Absolute: 0.1 10*3/uL (ref 0.0–0.7)
HCT: 46.3 % — ABNORMAL HIGH (ref 36.0–46.0)
Hemoglobin: 14.9 g/dL (ref 12.0–15.0)
LYMPHS PCT: 20 % (ref 12.0–46.0)
Lymphs Abs: 2.4 10*3/uL (ref 0.7–4.0)
MCHC: 32 g/dL (ref 30.0–36.0)
MCV: 96.5 fl (ref 78.0–100.0)
MONO ABS: 1 10*3/uL (ref 0.1–1.0)
MONOS PCT: 8.1 % (ref 3.0–12.0)
NEUTROS PCT: 70.9 % (ref 43.0–77.0)
Neutro Abs: 8.4 10*3/uL — ABNORMAL HIGH (ref 1.4–7.7)
PLATELETS: 405 10*3/uL — AB (ref 150.0–400.0)
RBC: 4.8 Mil/uL (ref 3.87–5.11)
RDW: 14.6 % (ref 11.5–14.6)
WBC: 11.8 10*3/uL — AB (ref 4.5–10.5)

## 2013-04-09 LAB — BASIC METABOLIC PANEL
BUN: 25 mg/dL — ABNORMAL HIGH (ref 6–23)
CHLORIDE: 104 meq/L (ref 96–112)
CO2: 27 meq/L (ref 19–32)
Calcium: 9.5 mg/dL (ref 8.4–10.5)
Creatinine, Ser: 1.1 mg/dL (ref 0.4–1.2)
GFR: 51.86 mL/min — ABNORMAL LOW (ref >60.00)
Glucose, Bld: 83 mg/dL (ref 70–99)
Potassium: 4.5 mEq/L (ref 3.5–5.1)
SODIUM: 139 meq/L (ref 135–145)

## 2013-04-09 LAB — PROTIME-INR
INR: 1 ratio (ref 0.8–1.0)
Prothrombin Time: 10.8 s (ref 10.2–12.4)

## 2013-04-09 MED ORDER — TECHNETIUM TC 99M SESTAMIBI GENERIC - CARDIOLITE
30.0000 | Freq: Once | INTRAVENOUS | Status: AC | PRN
  Administered 2013-04-09: 30 via INTRAVENOUS

## 2013-04-09 MED ORDER — ATORVASTATIN CALCIUM 80 MG PO TABS: 80.0000 mg | ORAL_TABLET | Freq: Every day | ORAL | Status: DC

## 2013-04-09 MED ORDER — TECHNETIUM TC 99M SESTAMIBI GENERIC - CARDIOLITE
10.0000 | Freq: Once | INTRAVENOUS | Status: AC | PRN
  Administered 2013-04-09: 10 via INTRAVENOUS

## 2013-04-09 MED ORDER — NITROGLYCERIN 0.4 MG SL SUBL: 0.4000 mg | SUBLINGUAL_TABLET | SUBLINGUAL | Status: DC | PRN

## 2013-04-09 MED ORDER — REGADENOSON 0.4 MG/5ML IV SOLN
0.4000 mg | Freq: Once | INTRAVENOUS | Status: AC
  Administered 2013-04-09: 0.4 mg via INTRAVENOUS

## 2013-04-09 MED ORDER — ASPIRIN EC 81 MG PO TBEC: 81.0000 mg | DELAYED_RELEASE_TABLET | Freq: Every day | ORAL | Status: DC

## 2013-04-09 NOTE — Progress Notes (Signed)
Amanda Curtis Date of Birth  October 01, 1935       [facility]    [employer] [address], [place], [place]  [address]   [place], [place]  [address] [address]   Fax  [phone]    Fax [phone]  Problem List: 1. Hypertension 2. Chest pain-abnormal stress Myoview study 3. Hypothyroidism 4. Hyperlipidemia  History of Present Illness:  Feb. 11, 2015: Cc:  Work in visit for + Amanda Curtis  [name] is a 78 year old female who recently moved from [place] ( saw Dr. Lacie Curtis, [place] ).   She has a history of hypertension. She's been having some episodes of chest discomfort.     She saw Amanda Pear., NP   for general medical care. At that time she complained of some chest pain and fatigue and dizziness.    She was seen by a cardiologist in [place] and has had a normal stress test.   She was seen in the ER last month for chest / neck pressure and fatigue and dizzines. She was referred for a Myoview study. Today she walked on a standard Bruce protocol treadmill test. She was unable to achieve target heart rate but did have significant ST segment depression. She was given a Amanda Curtis.  Her myoview images reveal mid-basil lateral ischemia.    She was worked into my DOD schedule for further evaluation.  She used to exercise regularly - until 2 years ago when she moved to [place] form [place].  She has developed fatigue with exertion.  She attributes her symptoms to her medications ( BP meds, generic synthroid)  Non smoker Rare ETOH Brother had CABG- late 40's   Current Outpatient Prescriptions on File Prior to Visit  Medication Sig Dispense Refill  . amLODipine (NORVASC) 5 MG tablet Take 1 tablet (5 mg total) by mouth daily.  30 tablet  1  . losartan (COZAAR) 100 MG tablet Take 100 mg by mouth daily.      [name] SYNTHROID 100 MCG tablet Take 1 tablet (100 mcg total) by  mouth daily before breakfast.  30 tablet  1  . TRADJENTA 5 MG TABS tablet Take 5 mg by mouth daily.       No current facility-administered medications on file prior to visit.    Allergies  Allergen Reactions  . Atorvastatin     Weakness, fatigue  . Elavil [Amitriptyline] Other (See Comments)    Tongue swelling  . Erythromycin Rash    Past Medical History  Diagnosis Date  . Hypertension   . Arthritis   . Graves disease 2000    s/p RIA  . Diabetes mellitus without complication   . Insomnia     Past Surgical History  Procedure Laterality Date  . Appendectomy    . Nasal septum surgery      childhood    History  Smoking status  . Never Smoker   Smokeless tobacco  . Never Used    History  Alcohol Use  . Yes    Comment: 1 glass wine every 6 months    Family History  Problem Relation Age of Onset  . Dementia Mother   . Cancer Mother     breast  . Diabetes Father   . Diabetes Paternal Grandmother   . Hypertension Paternal Grandmother     Reviw of Systems:  Reviewed in the HPI.  All  other systems are negative.  Physical Exam: Blood pressure 153/91, pulse 55, height 5\' 5"  (1.651 m), weight 153 lb (69.4 kg). Wt Readings from Last 3 Encounters:  04/09/13 153 lb (69.4 kg)  04/09/13 153 lb (69.4 kg)  03/28/13 160 lb (72.576 kg)     General: Well developed, well nourished, in no acute distress.  Head: Normocephalic, atraumatic, sclera non-icteric, mucus membranes are moist,   Neck: Supple. Carotids are 2 + without bruits. No JVD   Lungs: Clear   Heart: RR, normal S1, S2  Abdomen: Soft, non-tender, non-distended with normal bowel sounds.  Msk:  Strength and tone are normal   Extremities: No clubbing or cyanosis. No edema.  Distal pedal pulses are 2+ and equal    Neuro: CN II - XII intact.  Alert and oriented X 3.   Psych:  Normal   ECG: NSR, no ST / T wave changes.   During the treadmill, she developed 1-2 mm of ST depression in the lateral  leads.   Assessment / Plan:   She was referred to our office for further evaluation and neck and chest pressure. He's symptoms typically occur with exertion. Today she had ST segment changes on her ECG and she had a lateral defect on the Myoview images.  She has had treadmill tests in the past which were reportedly normal but clearly she has some abnormalities today.  It is possible that this is a false positive. We discussed this possibility but given the ST segment depression in the lateral defect and her symptoms I think that we should proceed with cardiac catheterization.  We discussed cardiac catheterization including risks, benefits, and options. She understands and agrees to proceed. We'll schedule Friday.   she seems quite anxious about everything.    We will start atorvastatin 80, ASA 81, NTG prn.

## 2013-04-09 NOTE — Progress Notes (Signed)
[facility] [place], [place] [address] [phone]    Cardiology Nuclear Med Study  Amanda Curtis is a 78 y.o. female     MRN : [id number]     DOB: Jul 18, 1935  Procedure Date: 04/09/2013  Nuclear Med Background Indication for Stress Test:  Evaluation for Ischemia, and Patient seen in hospital on 03-28-2013 for Chest Pain, Abnormal EKG,  Enzymes Negative History:  No known CAD Cardiac Risk Factors: Family History - CAD, Hypertension, Lipids and NIDDM  Symptoms:  Chest Pain (last date of chest discomfort was a few weeks ago) and Dizziness   Nuclear Pre-Procedure Caffeine/Decaff Intake:  None > 12 hrs NPO After: 9:00pm   Lungs:  clear O2 Sat: 98% on room air. IV 0.9% NS with Angio Cath:  22g  IV Site: R Antecubital x 1, tolerated well IV Started by:  Irven Baltimore, RN  Chest Size (in):  38 Cup Size: D  Height: 5\' 5"  (1.651 m)  Weight:  153 lb (69.4 kg)  BMI:  Body mass index is 25.46 kg/(m^2). Tech Comments:  Held Tradjenta this am    Nuclear Med Study 1 or 2 day study: 1 day  Stress Test Type:  Lexiscan  Reading MD: N/A  Order Authorizing Provider:  Penni Homans, MD, and Debbrah Alar, NP  Resting Radionuclide: Technetium 16m Sestamibi  Resting Radionuclide Dose: 11.0 mCi   Stress Radionuclide:  Technetium 105m Sestamibi  Stress Radionuclide Dose: 33.0 mCi           Stress Protocol Rest HR: 55 Stress HR: 90  Rest BP: 153/91 Stress BP: 143/72  Exercise Time (min): n/a METS: n/a           Dose of Adenosine (mg):  n/a Dose of Lexiscan: 0.4 mg  Dose of Atropine (mg): n/a Dose of Dobutamine: n/a mcg/kg/min (at max HR)  Stress Test Technologist: Glade Lloyd, BS-ES  Nuclear Technologist:  Charlton Amor, CNMT     Rest Procedure:  Myocardial perfusion imaging was performed at rest 45 minutes following the intravenous administration of Technetium 41m Sestamibi. Rest ECG: Normal sinus rhythm. Very mild ST  flattening  Stress Procedure:  The patient received IV Lexiscan 0.4 mg over 15-seconds.  Technetium 50m Sestamibi injected at 30-seconds.  Quantitative spect images were obtained after a 45 minute delay.  The patient attempted Bruce Protocol.  She became very fatigued and heart rate would not go up. Changed to [place].  She continued feeling fatigued with the [place].  Stress ECG: There is 1 mm of ST depression that is flat has a heart rate increases  QPS Raw Data Images:  Normal; no motion artifact; normal heart/lung ratio. Stress Images:  There is a medium-sized area of moderate decreased uptake affecting the base/mid inferolateral segments and the base/mid/anterolateral segments and the apical lateral segment. This area is partially reversible Rest Images:  Small/medium area of mild decreased uptake at the base/mid inferolateral segments and the base anterolateral segment. Subtraction (SDS):  There is ischemia present. Transient Ischemic Dilatation (Normal <1.22):  0.73 Lung/Heart Ratio (Normal <0.45):  0.29  Quantitative Gated Spect Images QGS EDV:  46 ml QGS ESV:  12 ml  Impression Exercise Capacity:  The patient started out walking on the treadmill. He developed marked fatigue. There was no definite chest pain. However as the heart rate increased into the range of 90 there was ST flattening with ST depression of 1 mm. Because of the marked fatigue and inadequate heart  rate response, the study was changed to [employer].  BP Response:  Normal blood pressure response. Clinical Symptoms:  The patient had a sensation of marked fatigue both when walking and when at rest with infusion ECG Impression:  When walking on the treadmill the patient had 1 mm of flat ST depression consistent with ischemia. Comparison with Prior Nuclear Study: No images to compare  Overall Impression:  The study is abnormal. This is a moderate risk scan. The patient had marked fatigue that is probably ischemia for  him. There were EKG abnormalities. The nuclear images reveal a moderate area of ischemia in the inferolateral wall and anterolateral walls.  LV Ejection Fraction: 73%.  LV Wall Motion:  Normal Wall Motion.  Dola Argyle, MD

## 2013-04-09 NOTE — Telephone Encounter (Signed)
Nuclear stress test is a better test, however if she is unwilling to complete nuclear then I will order traditional stress test.

## 2013-04-09 NOTE — Telephone Encounter (Signed)
Left message on home # to return my call. 

## 2013-04-09 NOTE — Telephone Encounter (Signed)
Spoke with pt. She states she already completed stress test this morning and is scheduled to have a catheterization on Friday.  Pt is very anxious and shocked.  Tried to reassure pt and advised her I would make Provider aware.  Cancelled traditional stress test order.

## 2013-04-09 NOTE — Patient Instructions (Signed)
Your physician has requested that you have a cardiac catheterization. Cardiac catheterization is used to diagnose and/or treat various heart conditions. Doctors may recommend this procedure for a number of different reasons. The most common reason is to evaluate chest pain. Chest pain can be a symptom of coronary artery disease (CAD), and cardiac catheterization can show whether plaque is narrowing or blocking your heart's arteries. This procedure is also used to evaluate the valves, as well as measure the blood flow and oxygen levels in different parts of your heart. For further information please visit [email or web address]. Please follow instruction sheet, as given.   Your physician recommends that you HAVE LAB WORK TODAY  START LIPITOR 80 MG ONCE DAILY  ASPIRIN 81 MG ONCE DAILY  NTG AS NEEDED FOR CHEST PAIN

## 2013-04-09 NOTE — Telephone Encounter (Signed)
Pt left message stating she doesn't want the nuclear stress test but would be willing to proceed with traditional stress test. States she does not want to be injected with any medication.  Please advise.

## 2013-04-09 NOTE — Assessment & Plan Note (Addendum)
He was referred to our office for further evaluation and neck and chest pressure. He's symptoms typically occur with exertion. Today she had ST segment changes on her ECG and she had a lateral defect on the Myoview images.  We discussed cardiac catheterization including risks, benefits, and options. She understands and agrees to proceed. We'll schedule Friday.   she seems quite anxious about everything.    We will start atorvastatin 80, ASA 81, NTG prn.

## 2013-04-10 ENCOUNTER — Telehealth: Payer: Self-pay | Admitting: Cardiovascular Disease

## 2013-04-10 NOTE — Telephone Encounter (Signed)
Pt is aware of lab results Pt spoke with me at length about her concern regarding heart catheterization tomorrow & possible methods of treatment should there need to be intervention. Stated "I don't want a stent" and asked me what other options there could be if she had a blockage. After discussion with pt inquiry about PTCA & stent being minimally invasive in comparison to CABG, pt calmer & more reassured. She is still not sure about have the heart catheterization tomorrow. I have told her many times that this is her decision & maybe talking with her family would be helpful Reassurance given Horton Chin RN

## 2013-04-10 NOTE — Telephone Encounter (Signed)
Patient states that she had a walking nuclear stress test yesterday and when she got home she had severe cramps in both legs,ankles, and feet. She used a moist heating pad and "tiger ointment" ( menthol ointment) and  right leg/foot symptoms were relieved but today still has the same symptoms in the left leg/ankle/foot. She does not do any regular exercise at home and is not used to being on a treadmill. States she is scheduled for a heart cath tomprrow and is worried about going thru with this because of the cramping/pain. Advised her to take Tylenol but she states she will not take this because it is an NSAID. I told her that Tylenol is NOT an NSAID and it was safe for her to take but she does not keep any in the house. Advised patient will send message to Dr.Nahser and call her back with his recommendations.

## 2013-04-10 NOTE — Telephone Encounter (Signed)
New problem   Pt has question concerning her test she had yesterday and left leg pain, she feel that it came from her having the test on yesterday.

## 2013-04-10 NOTE — Telephone Encounter (Signed)
Noted. Thanks.

## 2013-04-10 NOTE — Telephone Encounter (Signed)
Message copied by Verna Czech on Thu Apr 10, 2013 10:27 AM ------      Message from: Thayer Headings      Created: Thu Apr 10, 2013  5:42 AM       Labs are ok for upcoming cath       ------

## 2013-04-11 ENCOUNTER — Encounter (HOSPITAL_COMMUNITY): Admission: RE | Payer: Self-pay | Source: Ambulatory Visit

## 2013-04-11 ENCOUNTER — Ambulatory Visit (HOSPITAL_COMMUNITY)
Admission: RE | Admit: 2013-04-11 | Payer: Medicare Other | Source: Ambulatory Visit | Admitting: Interventional Cardiology

## 2013-04-11 SURGERY — LEFT HEART CATHETERIZATION WITH CORONARY ANGIOGRAM
Anesthesia: LOCAL

## 2013-04-14 ENCOUNTER — Institutional Professional Consult (permissible substitution): Payer: Medicare Other | Admitting: Cardiovascular Disease

## 2013-04-21 ENCOUNTER — Other Ambulatory Visit: Payer: Self-pay | Admitting: Family

## 2013-04-21 ENCOUNTER — Telehealth: Payer: Self-pay | Admitting: Family

## 2013-04-21 DIAGNOSIS — E039 Hypothyroidism, unspecified: Secondary | ICD-10-CM

## 2013-04-21 DIAGNOSIS — R9439 Abnormal result of other cardiovascular function study: Secondary | ICD-10-CM

## 2013-04-21 NOTE — Telephone Encounter (Signed)
Message copied by Debbrah Alar on Mon Apr 21, 2013  5:44 PM ------      Message from: Thayer Headings      Created: Mon Apr 21, 2013  5:28 PM       I have not heard why she cancelled.        I do not see any notes from my staff and the cath lab did not have any information.      She may benefit from a call from your office..            Thanks.            Phil Nahser            ----- Message -----         From: Debbrah Alar, NP         Sent: 04/21/2013   1:21 PM           To: Thayer Headings, MD            Dr. Acie Fredrickson,            Thank you for seeing Ms. Minehart.  Will she still be going for cath?  I see that her procedure was cancelled in [facility].            Thanks,            Caileigh Canche       ------

## 2013-04-21 NOTE — Telephone Encounter (Signed)
Could you please call pt and let her know that I noticed she cancelled her stress test.  I would recommend that she call cardiology to reschedule as soon as possible. They need to make sure that she does not have a heart blockage.

## 2013-04-22 ENCOUNTER — Other Ambulatory Visit: Payer: Self-pay | Admitting: Family

## 2013-04-22 ENCOUNTER — Ambulatory Visit: Payer: Medicare Other | Admitting: Family

## 2013-04-22 NOTE — Telephone Encounter (Signed)
Spoke with pt. She states she was told that her stress test photos were abnormal but the Provider could not tell her what was abnormal about them?.  States it did not appear that she has had previous heart attack. Pt wants to consider second opinion or wants to know if there is another test that can determine if she has a blockage other than a catheterization.  Pt states she felt very sick after the nuclear stress test then the cardiologist had her to start lipitor.  She states she took it for 2 days and then developed severe leg cramping to the point that she could not walk and that is why she cancelled the catheterization. Pt wants to know why she had this reaction and if it came from the Lipitor or the nuclear stress test?  Pt has rescheduled her appt from today to 04/30/13 with [place].

## 2013-04-22 NOTE — Telephone Encounter (Signed)
Mrs. Oquin clearly has lots of anxiety. She seems to hear only what she wants to hear and then panics over other stuff.  She states that one of the reasons that she canceled the test was that the doctors could not tell her exactly what the stress test showed.  This stress test, like all of the stress test, indicates areas of the heart that may be at risk for coronary ischemia. It  does not pinpoint the exact artery that is involved.    The Myoview study did not cause any of her symptoms. It is possible that atorvastatin caused her symptoms but it is unlikely just after several days. I agree with holding the atorvastatin to sort this out further.  She may have been anxious today that I met her at the stress test or perhaps she did not feel comfortable with me. I would be okay if you referred her to a different cardiologist. Perhaps she'll have better luck with another doctor.

## 2013-04-22 NOTE — Telephone Encounter (Signed)
Patient states that she is out of synthroid. Please send refill.

## 2013-04-22 NOTE — Telephone Encounter (Signed)
Please contact this patient and see if she transferred her care? It shows pt switched to [facility] this month? If she did tell pt that they will need to refill RX's

## 2013-04-22 NOTE — Telephone Encounter (Signed)
Spoke with pt and verified that levothyroxine should go to [facility] but she has questions about a substitute. She states [facility] has an otc supplement called nature's thyroid. States she has read information about the product and it is supposed to be as effective with less side effects. Pt wants to know if she can take that in place of levothyroxine.  Please advise.

## 2013-04-22 NOTE — Telephone Encounter (Signed)
Please advise pt to continue off of lipitor until she is seen back in our office on 3/4.  I am not certain if it was the contrast or the lipitor which was the culprit.  I will forward this note to Dr. Acie Fredrickson, cardiologist for review and further recommendations on their end.

## 2013-04-23 ENCOUNTER — Other Ambulatory Visit: Payer: Self-pay | Admitting: Family

## 2013-04-23 MED ORDER — LEVOTHYROXINE SODIUM 100 MCG PO TABS: 100.0000 ug | ORAL_TABLET | Freq: Every day | ORAL | Status: DC

## 2013-04-23 NOTE — Telephone Encounter (Signed)
Left message for patient to return my call.

## 2013-04-23 NOTE — Telephone Encounter (Signed)
I called pt and spoke with her for 20 minutes. Advised her that her symptoms in conjunction with abnormal stress test are concerning for underlying cardiac blockage and that she is at risk for MI/death if left untreated.  Advised her that cardiac cath is appropriate in this circumstance.  She desires second opinion from cardiologist.  Will refer to [facility] cardiology.  In regards to thyroid medication question (see refill request), she is requesting Armour thyroid rather than synthroid.  Advised her that I will refer her to Endo for further evaluation and defer med change to them.She has seen Dr. Dwyane Dee in the past and would like to return to see him.

## 2013-04-25 NOTE — Telephone Encounter (Signed)
Patient has appointment on 04/30/13 and her PCP is listed as Debbrah Alar.

## 2013-04-29 ENCOUNTER — Ambulatory Visit: Payer: Medicare Other | Admitting: Endocrinology

## 2013-04-30 ENCOUNTER — Telehealth: Payer: Self-pay | Admitting: Family

## 2013-04-30 ENCOUNTER — Ambulatory Visit (INDEPENDENT_AMBULATORY_CARE_PROVIDER_SITE_OTHER): Payer: Medicare Other | Admitting: Family

## 2013-04-30 ENCOUNTER — Encounter: Payer: Self-pay | Admitting: Family

## 2013-04-30 VITALS — BP 160/98 | HR 55 | Temp 97.9°F | Resp 16 | Ht 65.5 in | Wt 159.1 lb

## 2013-04-30 DIAGNOSIS — F411 Generalized anxiety disorder: Secondary | ICD-10-CM

## 2013-04-30 DIAGNOSIS — I10 Essential (primary) hypertension: Secondary | ICD-10-CM

## 2013-04-30 DIAGNOSIS — F419 Anxiety disorder, unspecified: Secondary | ICD-10-CM | POA: Insufficient documentation

## 2013-04-30 DIAGNOSIS — I200 Unstable angina: Secondary | ICD-10-CM

## 2013-04-30 DIAGNOSIS — E039 Hypothyroidism, unspecified: Secondary | ICD-10-CM

## 2013-04-30 MED ORDER — TERAZOSIN HCL 1 MG PO CAPS: 1.0000 mg | ORAL_CAPSULE | Freq: Every day | ORAL | Status: DC

## 2013-04-30 MED ORDER — ESCITALOPRAM OXALATE 5 MG PO TABS: 5.0000 mg | ORAL_TABLET | Freq: Every day | ORAL | Status: DC

## 2013-04-30 NOTE — Assessment & Plan Note (Signed)
Defer management to endo, has upcoming apt.

## 2013-04-30 NOTE — Assessment & Plan Note (Signed)
Likely cause for insomnia.  Will give trial of low dose lexapro to see if this helps.

## 2013-04-30 NOTE — Assessment & Plan Note (Signed)
First, I would like to remove amlodipine as she feels it is making her dizzy.  Will try hytrin for BP.  She thinks hctz made her dizzy and HR will not support addition of a beta blocker or clonidine at this time. Follow up in 2 weeks for BP check.

## 2013-04-30 NOTE — Progress Notes (Signed)
Subjective:    Patient ID: Amanda Curtis, female    DOB: August 02, 1935, 78 y.o.   MRN: [id number]  HPI  Amanda Curtis is a 78 yr old female who presents today for follow up of her hypertension. Current BP meds include:  Losartan 100, amlodipine 5 mg.  She reports that in the past she has been on diovan. Though she notes that at that time her weight was lower and she was more active. Feels dizzy since she started amlodipine.    BP Readings from Last 3 Encounters:  04/30/13 160/98  04/09/13 153/91  04/09/13 153/91   Abnormal Stress test- the pt declined recommended cardiac cath per cardiology. She opted for a second opinion and was referred to [facility].  Has apt scheduled for 3/11.  Denies current chest pain.  Anxiety- reports anxiety at night which makes it difficult for her to sleep.  Some anxiety during the day.    She is also scheduled to see Dr. Loanne Drilling to discuss her hypothyroidism.  She would like to transition to armour thyroid.   Review of Systems See HPI  Past Medical History  Diagnosis Date  . Hypertension   . Arthritis   . Graves disease 2000    s/p RIA  . Diabetes mellitus without complication   . Insomnia     History   Social History  . Marital Status: Widowed    Spouse Name: N/A    Number of Children: N/A  . Years of Education: N/A   Occupational History  . Not on file.   Social History Main Topics  . Smoking status: Never Smoker   . Smokeless tobacco: Never Used  . Alcohol Use: Yes     Comment: 1 glass wine every 6 months  . Drug Use: No  . Sexual Activity: Not on file   Other Topics Concern  . Not on file   Social History Narrative   Widow   Retired worked for [employer] and did a lot of travelling for [employer].   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   She has 1 son- lives in [place]   3 grandsons- college age (one is in [place])             Past  Surgical History  Procedure Laterality Date  . Appendectomy    . Nasal septum surgery      childhood    Family History  Problem Relation Age of Onset  . Dementia Mother   . Cancer Mother     breast  . Diabetes Father   . Diabetes Paternal Grandmother   . Hypertension Paternal Grandmother     Allergies  Allergen Reactions  . Elavil [Amitriptyline] Other (See Comments)    Tongue swelling  . Erythromycin Rash    Current Outpatient Prescriptions on File Prior to Visit  Medication Sig Dispense Refill  . aspirin EC 81 MG tablet Take 81 mg by mouth daily.      [name] atorvastatin (LIPITOR) 80 MG tablet Take 80 mg by mouth daily.      . fluticasone (FLONASE) 50 MCG/ACT nasal spray Place 1 spray into both nostrils daily as needed for allergies or rhinitis.      [name] linagliptin (TRADJENTA) 5 MG TABS tablet Take 5 mg by mouth daily.      [name] losartan (COZAAR) 100 MG tablet Take 100 mg by mouth daily.      [name]  nitroGLYCERIN (NITROSTAT) 0.4 MG SL tablet Place 0.4 mg under the tongue every 5 (five) minutes as needed for chest pain.      [name] SYNTHROID 100 MCG tablet take 1 tablet by mouth once daily  30 tablet  1   No current facility-administered medications on file prior to visit.    BP 160/98  Pulse 55  Temp(Src) 97.9 F (36.6 C) (Oral)  Resp 16  Ht 5' 5.5" (1.664 m)  Wt 159 lb 1.3 oz (72.158 kg)  BMI 26.06 kg/m2  SpO2 98%       Objective:   Physical Exam  Constitutional: She is oriented to person, place, and time. She appears well-developed and well-nourished. No distress.  HENT:  Head: Normocephalic and atraumatic.  Cardiovascular: Normal rate and regular rhythm.   No murmur heard. Pulmonary/Chest: Effort normal and breath sounds normal. No respiratory distress. She has no wheezes. She has no rales. She exhibits no tenderness.  Musculoskeletal: She exhibits no edema.  Neurological: She is alert and oriented to person, place, and time.  Psychiatric: Her behavior is normal.  Judgment and thought content normal.  Mildly anxious affect          Assessment & Plan:

## 2013-04-30 NOTE — Assessment & Plan Note (Signed)
Abnormal stress test. She declined cath, has upcoming appointment for second opinion with cardiologist.

## 2013-04-30 NOTE — Patient Instructions (Signed)
Please stop amlodipine. Start Hytrin for blood pressure. Wait 2 weeks, then start lexapro 5mg  once daily for sleep and anxiety. Follow up in 2 weeks for blood pressure check.

## 2013-04-30 NOTE — Telephone Encounter (Signed)
Relevant patient education mailed to patient.  

## 2013-04-30 NOTE — Progress Notes (Signed)
Pre visit review using our clinic review tool, if applicable. No additional management support is needed unless otherwise documented below in the visit note. 

## 2013-05-13 ENCOUNTER — Ambulatory Visit: Payer: Medicare Other | Admitting: Endocrinology

## 2013-05-14 ENCOUNTER — Encounter: Payer: Self-pay | Admitting: Family

## 2013-05-14 ENCOUNTER — Encounter: Payer: Medicare Other | Admitting: Gastroenterology

## 2013-05-14 ENCOUNTER — Ambulatory Visit (INDEPENDENT_AMBULATORY_CARE_PROVIDER_SITE_OTHER): Payer: Medicare Other | Admitting: Family

## 2013-05-14 VITALS — BP 150/94 | HR 55 | Temp 97.7°F | Resp 16 | Ht 65.5 in

## 2013-05-14 DIAGNOSIS — F411 Generalized anxiety disorder: Secondary | ICD-10-CM

## 2013-05-14 DIAGNOSIS — I10 Essential (primary) hypertension: Secondary | ICD-10-CM

## 2013-05-14 DIAGNOSIS — I200 Unstable angina: Secondary | ICD-10-CM

## 2013-05-14 DIAGNOSIS — F419 Anxiety disorder, unspecified: Secondary | ICD-10-CM

## 2013-05-14 MED ORDER — HYDROCHLOROTHIAZIDE 25 MG PO TABS: 25.0000 mg | ORAL_TABLET | Freq: Every day | ORAL | Status: DC

## 2013-05-14 NOTE — Assessment & Plan Note (Signed)
Uncontrolled. Will continue losartan and add hctz.

## 2013-05-14 NOTE — Assessment & Plan Note (Signed)
Completed cardiac CT- defer management to cardiology.

## 2013-05-14 NOTE — Patient Instructions (Addendum)
Please start hctz. Follow up in 3 weeks.

## 2013-05-14 NOTE — Progress Notes (Signed)
Pre visit review using our clinic review tool, if applicable. No additional management support is needed unless otherwise documented below in the visit note. 

## 2013-05-14 NOTE — Progress Notes (Signed)
Subjective:    Patient ID: Amanda Curtis, female    DOB: 13-Jan-1936, 78 y.o.   MRN: [id number]  HPI  Amanda Curtis is a 78 yr old female who presents today for follow up.  1) Abnormal stress test- since our last visit she was evaluated by [facility] and per consultation note, they agreed with catheterization.  She declined but was agreeable to proceed with a cardiac CT.  She completed cardiac CT yesterday but has not yet heard back about her results.  2) HTN-  Last visit BP was noted to be elevated.  She noted dizziness on amlodipine. We discontinued amlodipine and hytrin was added to her regimen. She reports that she had dizziness with hytrin.  Reports that that she had extreme weakness.  Denies LOC.  Felt "Like I was going to faint."  Denies current dizziness.  She has used hctz in the past as part of combo pill.   BP Readings from Last 3 Encounters:  05/14/13 150/94  04/30/13 160/98  04/09/13 153/91   3) Anxiety- has not yet started lexapro.     Review of Systems See HPI  Past Medical History  Diagnosis Date  . Hypertension   . Arthritis   . Graves disease 2000    s/p RIA  . Diabetes mellitus without complication   . Insomnia     History   Social History  . Marital Status: Widowed    Spouse Name: N/A    Number of Children: N/A  . Years of Education: N/A   Occupational History  . Not on file.   Social History Main Topics  . Smoking status: Never Smoker   . Smokeless tobacco: Never Used  . Alcohol Use: Yes     Comment: 1 glass wine every 6 months  . Drug Use: No  . Sexual Activity: Not on file   Other Topics Concern  . Not on file   Social History Narrative   Widow   Retired worked for [employer] and did a lot of travelling for [employer].   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   She has 1 son- lives in [place]   3 grandsons- college age (one is in [place])            Past Surgical History  Procedure Laterality Date  . Appendectomy    . Nasal septum surgery      childhood    Family History  Problem Relation Age of Onset  . Dementia Mother   . Cancer Mother     breast  . Diabetes Father   . Diabetes Paternal Grandmother   . Hypertension Paternal Grandmother     Allergies  Allergen Reactions  . Atorvastatin Other (See Comments)    Severe muscle cramping  . Elavil [Amitriptyline] Other (See Comments)    Tongue swelling  . Hytrin [Terazosin] Other (See Comments)    syncope  . Erythromycin Rash    Current Outpatient Prescriptions on File Prior to Visit  Medication Sig Dispense Refill  . aspirin EC 81 MG tablet Take 81 mg by mouth daily.      . fluticasone (FLONASE) 50 MCG/ACT nasal spray Place 1 spray into both nostrils daily as needed for allergies or rhinitis.      [name] linagliptin (TRADJENTA) 5 MG TABS tablet Take 5 mg by mouth daily.      [name] losartan (COZAAR) 100 MG tablet  Take 100 mg by mouth daily.      . nitroGLYCERIN (NITROSTAT) 0.4 MG SL tablet Place 0.4 mg under the tongue every 5 (five) minutes as needed for chest pain.      [name] SYNTHROID 100 MCG tablet take 1 tablet by mouth once daily  30 tablet  1  . escitalopram (LEXAPRO) 5 MG tablet Take 1 tablet (5 mg total) by mouth daily.  30 tablet  1   No current facility-administered medications on file prior to visit.    BP 150/94  Pulse 55  Temp(Src) 97.7 F (36.5 C) (Oral)  Resp 16  Ht 5' 5.5" (1.664 m)  SpO2 99%       Objective:   Physical Exam  Constitutional: She appears well-developed and well-nourished. No distress.  Cardiovascular: Normal rate and regular rhythm.   No murmur heard. Pulmonary/Chest: Effort normal and breath sounds normal. No respiratory distress. She has no wheezes. She has no rales. She exhibits no tenderness.  Musculoskeletal: She exhibits no edema.  Psychiatric: Her speech is normal and behavior is normal. Thought content normal. Her  mood appears anxious. She does not exhibit a depressed mood.          Assessment & Plan:

## 2013-05-14 NOTE — Assessment & Plan Note (Signed)
Advised pt to start hctz x 1 week before starting lexapro. Then start lexapro in 1 week if tolerating hctz.

## 2013-05-23 ENCOUNTER — Encounter: Payer: Self-pay | Admitting: Family

## 2013-05-23 DIAGNOSIS — I25810 Atherosclerosis of coronary artery bypass graft(s) without angina pectoris: Secondary | ICD-10-CM | POA: Insufficient documentation

## 2013-05-23 DIAGNOSIS — I2510 Atherosclerotic heart disease of native coronary artery without angina pectoris: Secondary | ICD-10-CM | POA: Insufficient documentation

## 2013-06-03 ENCOUNTER — Encounter: Payer: Self-pay | Admitting: Family Medicine

## 2013-06-03 ENCOUNTER — Telehealth: Payer: Self-pay | Admitting: *Deleted

## 2013-06-03 NOTE — Telephone Encounter (Signed)
Patient no show for previsit 06/03/13. Called to reschedule. Patient states she is undergoing cardiac workup right now and she needs to get through these tests before her colonoscopy. Desires to cancel procedure and will call to reschedule previsit and colonoscopy when timing better for her.

## 2013-06-04 ENCOUNTER — Encounter: Payer: Self-pay | Admitting: Family

## 2013-06-04 ENCOUNTER — Telehealth: Payer: Self-pay | Admitting: *Deleted

## 2013-06-04 ENCOUNTER — Telehealth: Payer: Self-pay | Admitting: Cardiovascular Disease

## 2013-06-04 ENCOUNTER — Ambulatory Visit (INDEPENDENT_AMBULATORY_CARE_PROVIDER_SITE_OTHER): Payer: Medicare Other | Admitting: Family

## 2013-06-04 ENCOUNTER — Telehealth: Payer: Self-pay | Admitting: Family

## 2013-06-04 ENCOUNTER — Encounter: Payer: Self-pay | Admitting: Family Medicine

## 2013-06-04 VITALS — BP 138/98 | HR 50 | Temp 98.0°F | Resp 18 | Ht 65.5 in | Wt 163.0 lb

## 2013-06-04 DIAGNOSIS — I10 Essential (primary) hypertension: Secondary | ICD-10-CM

## 2013-06-04 DIAGNOSIS — R9439 Abnormal result of other cardiovascular function study: Secondary | ICD-10-CM

## 2013-06-04 DIAGNOSIS — F419 Anxiety disorder, unspecified: Secondary | ICD-10-CM

## 2013-06-04 DIAGNOSIS — F411 Generalized anxiety disorder: Secondary | ICD-10-CM

## 2013-06-04 DIAGNOSIS — I2510 Atherosclerotic heart disease of native coronary artery without angina pectoris: Secondary | ICD-10-CM

## 2013-06-04 LAB — BASIC METABOLIC PANEL
BUN: 23 mg/dL (ref 6–23)
CHLORIDE: 101 meq/L (ref 96–112)
CO2: 26 mEq/L (ref 19–32)
CREATININE: 0.96 mg/dL (ref 0.50–1.10)
Calcium: 9.7 mg/dL (ref 8.4–10.5)
Glucose, Bld: 95 mg/dL (ref 70–99)
POTASSIUM: 5.4 meq/L — AB (ref 3.5–5.3)
Sodium: 136 mEq/L (ref 135–145)

## 2013-06-04 MED ORDER — HYDRALAZINE HCL 25 MG PO TABS: 25.0000 mg | ORAL_TABLET | Freq: Three times a day (TID) | ORAL | Status: DC

## 2013-06-04 NOTE — Telephone Encounter (Signed)
Chart reviewed. Do you want her to have an app for an updated H&P?

## 2013-06-04 NOTE — Progress Notes (Signed)
Subjective:    Patient ID: Amanda Curtis, female    DOB: Jul 04, 1935, 78 y.o.   MRN: [id number]  HPI  Amanda Curtis is a 78 yr old female who presents today for follow up.   1) Abnormal Stress Test- Last visit the patient requested a second opinion from cardiology.  She saw Dr. Wyline Copas who performed a cardiac CT which confirmed severe 3 vessel CAD.  She is declining cardiac cath which he recommended for her. She is taking losartan and hctz.  Dr.  Wyline Copas also recommended that she start bystolic and imdur which she did not start.  2) HTN- not taking bystolic. Is tolerating hctz and losartan.   BP Readings from Last 3 Encounters:  06/04/13 138/98  05/14/13 150/94  04/30/13 160/98   3) Anxiety- did not start lexapro.    Review of Systems See HPI  Past Medical History  Diagnosis Date  . Hypertension   . Arthritis   . Graves disease 2000    s/p RIA  . Diabetes mellitus without complication   . Insomnia     History   Social History  . Marital Status: Widowed    Spouse Name: N/A    Number of Children: N/A  . Years of Education: N/A   Occupational History  . Not on file.   Social History Main Topics  . Smoking status: Never Smoker   . Smokeless tobacco: Never Used  . Alcohol Use: Yes     Comment: 1 glass wine every 6 months  . Drug Use: No  . Sexual Activity: Not on file   Other Topics Concern  . Not on file   Social History Narrative   Widow   Retired worked for [employer] and did a lot of travelling for [employer].   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   She has 1 son- lives in [place]   3 grandsons- college age (one is in [place])             Past Surgical History  Procedure Laterality Date  . Appendectomy    . Nasal septum surgery      childhood    Family History  Problem Relation Age of Onset  . Dementia Mother   . Cancer Mother     breast  . Diabetes Father   .  Diabetes Paternal Grandmother   . Hypertension Paternal Grandmother     Allergies  Allergen Reactions  . Atorvastatin Other (See Comments)    Severe muscle cramping  . Elavil [Amitriptyline] Other (See Comments)    Tongue swelling  . Hytrin [Terazosin] Other (See Comments)    syncope  . Erythromycin Rash    Current Outpatient Prescriptions on File Prior to Visit  Medication Sig Dispense Refill  . aspirin EC 81 MG tablet Take 81 mg by mouth daily.      [name] escitalopram (LEXAPRO) 5 MG tablet Take 1 tablet (5 mg total) by mouth daily.  30 tablet  1  . hydrochlorothiazide (HYDRODIURIL) 25 MG tablet Take 1 tablet (25 mg total) by mouth daily.  30 tablet  0  . linagliptin (TRADJENTA) 5 MG TABS tablet Take 5 mg by mouth daily.      [name] losartan (COZAAR) 100 MG tablet Take 100 mg by mouth daily.      . nitroGLYCERIN (NITROSTAT) 0.4 MG SL tablet Place 0.4 mg under the tongue every 5 (five) minutes  as needed for chest pain.      [name] SYNTHROID 100 MCG tablet take 1 tablet by mouth once daily  30 tablet  1   No current facility-administered medications on file prior to visit.    BP 138/98  Pulse 50  Temp(Src) 98 F (36.7 C) (Oral)  Resp 18  Ht 5' 5.5" (1.664 m)  Wt 163 lb (73.936 kg)  BMI 26.70 kg/m2  SpO2 98%       Objective:   Physical Exam  Constitutional: She is oriented to person, place, and time. She appears well-developed and well-nourished. No distress.  HENT:  Head: Normocephalic and atraumatic.  Cardiovascular: Normal rate and regular rhythm.   No murmur heard. Pulmonary/Chest: Effort normal and breath sounds normal. No respiratory distress. She has no wheezes. She has no rales. She exhibits no tenderness.  Lymphadenopathy:    She has no cervical adenopathy.  Neurological: She is alert and oriented to person, place, and time.  Psychiatric: She has a normal mood and affect. Her behavior is normal. Judgment and thought content normal.          Assessment & Plan:

## 2013-06-04 NOTE — Progress Notes (Signed)
Pre visit review using our clinic review tool, if applicable. No additional management support is needed unless otherwise documented below in the visit note. 

## 2013-06-04 NOTE — Telephone Encounter (Signed)
New message     Talk to Dr Elmarie Shiley nurse today regarding scheduling patient a heart cath

## 2013-06-04 NOTE — Patient Instructions (Addendum)
Please complete lab work prior to leaving. Start Isosorbide. You will be contacted by cardiology re: your cardiac catheterization. Follow up in 2 weeks.

## 2013-06-04 NOTE — Telephone Encounter (Addendum)
Dr. Acie Fredrickson-  I met again with Amanda Curtis today. She saw Dr. Wyline Copas, cardiologist in [place] who performed a cardiac CT. Cardiac CT revealed severe 3 vessel disease. She has decided that she would like to proceed with cardiac cath at [facility] in the next 1-2 weeks.  Could your office please arrange?

## 2013-06-04 NOTE — Telephone Encounter (Signed)
Per verbal from PCP, cancelled hydralazine Rx at [facility].

## 2013-06-04 NOTE — Assessment & Plan Note (Signed)
Declines lexapro at this time.

## 2013-06-04 NOTE — Assessment & Plan Note (Signed)
Long discussion today with pt re: risk of untreated CAD. After further discussion, she would like to proceed with cardiac cath with [facility].  I will contact cardiology office to notify them that pt would like to reschedule her cath.

## 2013-06-04 NOTE — Assessment & Plan Note (Signed)
I rechecked BP manually and got reading 168/110.  She has rx for imdur 30 mg.  I have asked her to start imdur. She does not wish to take beta blocker due to her resting bradycardia.  Continue losartan and hctz.

## 2013-06-05 NOTE — Telephone Encounter (Signed)
Will need an office visit

## 2013-06-05 NOTE — Telephone Encounter (Signed)
Will have my nurse will be calling her today to arrange an office visit.  I think she needs to come by and discuss the cath before we schedule it again.

## 2013-06-05 NOTE — Telephone Encounter (Signed)
Pt was offered app 4/13 and 4/15 to be seen by Dr Acie Fredrickson / pt declined. App set for 4/21.

## 2013-06-06 ENCOUNTER — Telehealth: Payer: Self-pay | Admitting: Family

## 2013-06-06 DIAGNOSIS — E875 Hyperkalemia: Secondary | ICD-10-CM

## 2013-06-06 LAB — BASIC METABOLIC PANEL
BUN: 20 mg/dL (ref 6–23)
CHLORIDE: 103 meq/L (ref 96–112)
CO2: 25 mEq/L (ref 19–32)
Calcium: 9.3 mg/dL (ref 8.4–10.5)
Creat: 1.11 mg/dL — ABNORMAL HIGH (ref 0.50–1.10)
GLUCOSE: 128 mg/dL — AB (ref 70–99)
POTASSIUM: 4.5 meq/L (ref 3.5–5.3)
Sodium: 136 mEq/L (ref 135–145)

## 2013-06-06 NOTE — Telephone Encounter (Signed)
Please call pt and ask her to repeat bmet today.  Potassium was noted to be elevated.  I want to confirm this before changing her medications.  Sometimes losartan and medications in that class can cause potassium to rise.   If it is still elevated, then we will need to stop losartan and place her on a different medication.

## 2013-06-06 NOTE — Telephone Encounter (Signed)
Notified pt. She states she had 3 bananas last week and 1 on Monday. She will return to the lab today. Wanted [place] to be aware that she has follow up with Dr Jasper Riling to discuss heart cath.

## 2013-06-17 ENCOUNTER — Ambulatory Visit (INDEPENDENT_AMBULATORY_CARE_PROVIDER_SITE_OTHER): Payer: Medicare Other | Admitting: Cardiovascular Disease

## 2013-06-17 ENCOUNTER — Encounter: Payer: Medicare Other | Admitting: Gastroenterology

## 2013-06-17 ENCOUNTER — Encounter: Payer: Self-pay | Admitting: Nurse Practitioner

## 2013-06-17 ENCOUNTER — Encounter: Payer: Self-pay | Admitting: Cardiovascular Disease

## 2013-06-17 VITALS — BP 152/100 | HR 52 | Ht 66.0 in | Wt 162.4 lb

## 2013-06-17 DIAGNOSIS — I2510 Atherosclerotic heart disease of native coronary artery without angina pectoris: Secondary | ICD-10-CM

## 2013-06-17 MED ORDER — PRAVASTATIN SODIUM 40 MG PO TABS: 40.0000 mg | ORAL_TABLET | Freq: Every evening | ORAL | Status: DC

## 2013-06-17 NOTE — Progress Notes (Signed)
Amanda Curtis Date of Birth  1935/03/13       [facility]    [employer] [address], [place], [place]  [address]   [place],   [address] [place]   Fax  [phone]    Fax [phone]  Problem List: 1. Hypertension 2. Chest pain-abnormal stress Myoview study 3. Hypothyroidism 4. Hyperlipidemia  History of Present Illness:  Feb. 11, 2015: Cc:  Work in visit for + [name]  Amanda Curtis is a 78 year old female who recently moved from [place] ( saw Dr. Lacie Draft, [place] ).   She has a history of hypertension. She's been having some episodes of chest discomfort.     She saw Nance Pear., NP   for general medical care. At that time she complained of some chest pain and fatigue and dizziness.    She was seen by a cardiologist in [place] and has had a normal stress test.   She was seen in the ER last month for chest / neck pressure and fatigue and dizzines. She was referred for a Myoview study. Today she walked on a standard Bruce protocol treadmill test. She was unable to achieve target heart rate but did have significant ST segment depression. She was given a Tax inspector.  Her myoview images reveal mid-basil lateral ischemia.    She was worked into my DOD schedule for further evaluation.  She used to exercise regularly - until 2 years ago when she moved to [place] form [place].  She has developed fatigue with exertion.  She attributes her symptoms to her medications ( BP meds, generic synthroid)  Non smoker Rare ETOH Brother had CABG- late 40's  June 17, 2013:  [name] presents for follow up visit. With we initially set her up for cardiac catheterization but she did not show for cath. We also started her on atorvastatin during that visit.  She stopped it after   The patient requested a second opinion from cardiology. She saw Dr. Wyline Copas [facility])   who performed a cardiac CT which confirmed severe 3 vessel CAD  She still has some CP with ambulation.     Current Outpatient Prescriptions on File Prior to Visit  Medication Sig Dispense Refill  . aspirin EC 81 MG tablet Take 81 mg by mouth daily.      . hydrochlorothiazide (HYDRODIURIL) 25 MG tablet Take 1 tablet (25 mg total) by mouth daily.  30 tablet  0  . isosorbide mononitrate (IMDUR) 30 MG 24 hr tablet Take 30 mg by mouth daily.      [name] linagliptin (TRADJENTA) 5 MG TABS tablet Take 5 mg by mouth daily.      [name] losartan (COZAAR) 100 MG tablet Take 100 mg by mouth daily.      . nitroGLYCERIN (NITROSTAT) 0.4 MG SL tablet Place 0.4 mg under the tongue every 5 (five) minutes as needed for chest pain.      [name] SYNTHROID 100 MCG tablet take 1 tablet by mouth once daily  30 tablet  1   No current facility-administered medications on file prior to visit.    Allergies  Allergen Reactions  . Atorvastatin Other (See Comments)    Severe muscle cramping  . Elavil [Amitriptyline] Other (See Comments)    Tongue swelling  . Hytrin [Terazosin] Other (See Comments)    syncope  . Erythromycin Rash  Past Medical History  Diagnosis Date  . Hypertension   . Arthritis   . Graves disease 2000    s/p RIA  . Diabetes mellitus without complication   . Insomnia     Past Surgical History  Procedure Laterality Date  . Appendectomy    . Nasal septum surgery      childhood    History  Smoking status  . Never Smoker   Smokeless tobacco  . Never Used    History  Alcohol Use  . Yes    Comment: 1 glass wine every 6 months    Family History  Problem Relation Age of Onset  . Dementia Mother   . Cancer Mother     breast  . Diabetes Father   . Diabetes Paternal Grandmother   . Hypertension Paternal Grandmother     Reviw of Systems:  Reviewed in the HPI.  All other systems are negative.  Physical Exam: Blood pressure 152/100, pulse 52, height 5\' 6"  (1.676 m), weight 162 lb 6.4 oz  (73.664 kg). Wt Readings from Last 3 Encounters:  06/17/13 162 lb 6.4 oz (73.664 kg)  06/04/13 163 lb (73.936 kg)  04/30/13 159 lb 1.3 oz (72.158 kg)     General: Well developed, well nourished, in no acute distress.  Head: Normocephalic, atraumatic, sclera non-icteric, mucus membranes are moist,   Neck: Supple. Carotids are 2 + without bruits. No JVD   Lungs: Clear   Heart: RR, normal S1, S2  Abdomen: Soft, non-tender, non-distended with normal bowel sounds.  Msk:  Strength and tone are normal   Extremities: No clubbing or cyanosis. No edema.  Distal pedal pulses are 2+ and equal    Neuro: CN II - XII intact.  Alert and oriented X 3.   Psych:  Normal   ECG: NSR, no ST / T wave changes.   During the treadmill, she developed 1-2 mm of ST depression in the lateral leads.   Assessment / Plan:   She was referred to our office for further evaluation and neck and chest pressure. He's symptoms typically occur with exertion. Today she had ST segment changes on her ECG and she had a lateral defect on the Myoview images.  She has had treadmill tests in the past which were reportedly normal but clearly she has some abnormalities today.  It is possible that this is a false positive. We discussed this possibility but given the ST segment depression in the lateral defect and her symptoms I think that we should proceed with cardiac catheterization.  We discussed cardiac catheterization including risks, benefits, and options. She understands and agrees to proceed. We'll schedule Friday.   she seems quite anxious about everything.    We will start atorvastatin 80, ASA 81, NTG prn.

## 2013-06-17 NOTE — Patient Instructions (Addendum)
Your physician has requested that you have a cardiac catheterization. Cardiac catheterization is used to diagnose and/or treat various heart conditions. Doctors may recommend this procedure for a number of different reasons. The most common reason is to evaluate chest pain. Chest pain can be a symptom of coronary artery disease (CAD), and cardiac catheterization can show whether plaque is narrowing or blocking your heart's arteries. This procedure is also used to evaluate the valves, as well as measure the blood flow and oxygen levels in different parts of your heart. For further information please visit [email or web address]. Please follow instruction sheet, as given.  Your physician recommends that you return for lab work on 4/30 for pre-cath labs.  You do not have to fast for this appointment.  Your physician has recommended you make the following change in your medication:  START Pravachol 40 mg daily  Your physician wants you to follow-up in: 6 months with Dr. Acie Fredrickson.  You will receive a reminder letter in the mail two months in advance. If you don't receive a letter, please call our office to schedule the follow-up appointment.

## 2013-06-17 NOTE — Assessment & Plan Note (Signed)
Ms Braxton presents today for followup. She has talked with Jeri LagerConley Canal. She has decided that she will have a cardiac catheterization.  Saw her several months ago, she is going to have cardiology in and had a second opinion. She apparently had a CT angiogram which revealed three-vessel coronary artery disease.     She now agrees to have a cardiac cath position. We discussed the risks, benefits, and options of cardiac ablation. She understands and agrees to proceed. We'll get precath labs next week. She needs to have a cardiac catheterization on Monday, May 4 we will start her on pravastatin 40 mg a day. We tried her on atorvastatin but she developed an episode of leg cramping and does not want to take atorvastatin again

## 2013-06-18 ENCOUNTER — Ambulatory Visit: Payer: Medicare Other | Admitting: Family

## 2013-06-20 ENCOUNTER — Encounter (HOSPITAL_COMMUNITY): Payer: Self-pay | Admitting: Pharmacy Technician

## 2013-06-23 ENCOUNTER — Encounter (HOSPITAL_COMMUNITY): Payer: Self-pay | Admitting: Pharmacy Technician

## 2013-06-26 ENCOUNTER — Other Ambulatory Visit (INDEPENDENT_AMBULATORY_CARE_PROVIDER_SITE_OTHER): Payer: Medicare Other

## 2013-06-26 DIAGNOSIS — I2510 Atherosclerotic heart disease of native coronary artery without angina pectoris: Secondary | ICD-10-CM

## 2013-06-26 LAB — BASIC METABOLIC PANEL
BUN: 20 mg/dL (ref 6–23)
CALCIUM: 9.6 mg/dL (ref 8.4–10.5)
CHLORIDE: 100 meq/L (ref 96–112)
CO2: 27 meq/L (ref 19–32)
CREATININE: 1.1 mg/dL (ref 0.4–1.2)
GFR: 52.95 mL/min — ABNORMAL LOW (ref >60.00)
Glucose, Bld: 123 mg/dL — ABNORMAL HIGH (ref 70–99)
Potassium: 3.7 mEq/L (ref 3.5–5.1)
Sodium: 135 mEq/L (ref 135–145)

## 2013-06-26 LAB — PROTIME-INR
INR: 1 ratio (ref 0.8–1.0)
Prothrombin Time: 10.8 s (ref 9.6–13.1)

## 2013-06-26 LAB — CBC WITH DIFFERENTIAL/PLATELET
BASOS PCT: 0.7 % (ref 0.0–3.0)
Basophils Absolute: 0.1 10*3/uL (ref 0.0–0.1)
Eosinophils Absolute: 0.1 10*3/uL (ref 0.0–0.7)
Eosinophils Relative: 1.3 % (ref 0.0–5.0)
HEMATOCRIT: 44.1 % (ref 36.0–46.0)
Hemoglobin: 14.3 g/dL (ref 12.0–15.0)
LYMPHS ABS: 1.9 10*3/uL (ref 0.7–4.0)
Lymphocytes Relative: 21.7 % (ref 12.0–46.0)
MCHC: 32.5 g/dL (ref 30.0–36.0)
MCV: 95.2 fl (ref 78.0–100.0)
MONO ABS: 0.6 10*3/uL (ref 0.1–1.0)
Monocytes Relative: 6.6 % (ref 3.0–12.0)
NEUTROS PCT: 69.7 % (ref 43.0–77.0)
Neutro Abs: 6.1 10*3/uL (ref 1.4–7.7)
Platelets: 430 10*3/uL — ABNORMAL HIGH (ref 150.0–400.0)
RBC: 4.63 Mil/uL (ref 3.87–5.11)
RDW: 15.4 % — ABNORMAL HIGH (ref 11.5–14.6)
WBC: 8.7 10*3/uL (ref 4.5–10.5)

## 2013-06-30 ENCOUNTER — Encounter (HOSPITAL_COMMUNITY)
Admission: RE | Disposition: A | Discharge status: Skilled Nursing Facility | Payer: Medicare Other | Source: Ambulatory Visit | Attending: Surgery

## 2013-06-30 ENCOUNTER — Encounter (HOSPITAL_COMMUNITY): Payer: Self-pay | Admitting: *Deleted

## 2013-06-30 ENCOUNTER — Other Ambulatory Visit: Payer: Self-pay | Admitting: *Deleted

## 2013-06-30 ENCOUNTER — Other Ambulatory Visit: Payer: Self-pay | Admitting: Cardiovascular Disease

## 2013-06-30 ENCOUNTER — Inpatient Hospital Stay (HOSPITAL_COMMUNITY)
Admission: RE | Admit: 2013-06-30 | Discharge: 2013-07-09 | DRG: 234 | Disposition: A | Discharge status: Skilled Nursing Facility | Payer: Medicare Other | Source: Ambulatory Visit | Attending: Surgery | Admitting: Surgery

## 2013-06-30 DIAGNOSIS — Z888 Allergy status to other drugs, medicaments and biological substances status: Secondary | ICD-10-CM

## 2013-06-30 DIAGNOSIS — Z833 Family history of diabetes mellitus: Secondary | ICD-10-CM

## 2013-06-30 DIAGNOSIS — R110 Nausea: Secondary | ICD-10-CM | POA: Diagnosis not present

## 2013-06-30 DIAGNOSIS — D62 Acute posthemorrhagic anemia: Secondary | ICD-10-CM | POA: Diagnosis not present

## 2013-06-30 DIAGNOSIS — Z8249 Family history of ischemic heart disease and other diseases of the circulatory system: Secondary | ICD-10-CM

## 2013-06-30 DIAGNOSIS — I2510 Atherosclerotic heart disease of native coronary artery without angina pectoris: Principal | ICD-10-CM

## 2013-06-30 DIAGNOSIS — M129 Arthropathy, unspecified: Secondary | ICD-10-CM | POA: Diagnosis present

## 2013-06-30 DIAGNOSIS — Z881 Allergy status to other antibiotic agents status: Secondary | ICD-10-CM

## 2013-06-30 DIAGNOSIS — I10 Essential (primary) hypertension: Secondary | ICD-10-CM | POA: Diagnosis present

## 2013-06-30 DIAGNOSIS — I498 Other specified cardiac arrhythmias: Secondary | ICD-10-CM | POA: Diagnosis present

## 2013-06-30 DIAGNOSIS — I2584 Coronary atherosclerosis due to calcified coronary lesion: Secondary | ICD-10-CM | POA: Diagnosis present

## 2013-06-30 DIAGNOSIS — E119 Type 2 diabetes mellitus without complications: Secondary | ICD-10-CM | POA: Diagnosis present

## 2013-06-30 DIAGNOSIS — Z7982 Long term (current) use of aspirin: Secondary | ICD-10-CM

## 2013-06-30 DIAGNOSIS — E8779 Other fluid overload: Secondary | ICD-10-CM | POA: Diagnosis not present

## 2013-06-30 DIAGNOSIS — E890 Postprocedural hypothyroidism: Secondary | ICD-10-CM | POA: Diagnosis present

## 2013-06-30 DIAGNOSIS — G4700 Insomnia, unspecified: Secondary | ICD-10-CM | POA: Diagnosis present

## 2013-06-30 DIAGNOSIS — Z01811 Encounter for preprocedural respiratory examination: Secondary | ICD-10-CM

## 2013-06-30 DIAGNOSIS — E785 Hyperlipidemia, unspecified: Secondary | ICD-10-CM | POA: Diagnosis present

## 2013-06-30 DIAGNOSIS — E7800 Pure hypercholesterolemia, unspecified: Secondary | ICD-10-CM | POA: Diagnosis present

## 2013-06-30 DIAGNOSIS — Z951 Presence of aortocoronary bypass graft: Secondary | ICD-10-CM

## 2013-06-30 DIAGNOSIS — E039 Hypothyroidism, unspecified: Secondary | ICD-10-CM | POA: Diagnosis present

## 2013-06-30 DIAGNOSIS — T40605A Adverse effect of unspecified narcotics, initial encounter: Secondary | ICD-10-CM | POA: Diagnosis not present

## 2013-06-30 DIAGNOSIS — I200 Unstable angina: Secondary | ICD-10-CM | POA: Diagnosis present

## 2013-06-30 HISTORY — PX: LEFT HEART CATHETERIZATION WITH CORONARY ANGIOGRAM: SHX5451

## 2013-06-30 LAB — GLUCOSE, CAPILLARY
GLUCOSE-CAPILLARY: 94 mg/dL (ref 70–99)
Glucose-Capillary: 112 mg/dL — ABNORMAL HIGH (ref 70–99)
Glucose-Capillary: 79 mg/dL (ref 70–99)
Glucose-Capillary: 153 mg/dL — ABNORMAL HIGH (ref 70–99)

## 2013-06-30 LAB — MRSA PCR SCREENING: MRSA BY PCR: NEGATIVE

## 2013-06-30 LAB — HEMOGLOBIN A1C
Hgb A1c MFr Bld: 6.4 % — ABNORMAL HIGH (ref <5.7)
Mean Plasma Glucose: 137 mg/dL — ABNORMAL HIGH (ref <117)

## 2013-06-30 SURGERY — LEFT HEART CATHETERIZATION WITH CORONARY ANGIOGRAM
Anesthesia: LOCAL

## 2013-06-30 MED ORDER — HYDROCHLOROTHIAZIDE 25 MG PO TABS
25.0000 mg | ORAL_TABLET | Freq: Every day | ORAL | Status: DC
  Administered 2013-07-01 – 2013-07-03 (×3): 25 mg via ORAL
  Filled 2013-06-30 (×4): qty 1

## 2013-06-30 MED ORDER — HEPARIN SODIUM (PORCINE) 1000 UNIT/ML IJ SOLN
INTRAMUSCULAR | Status: AC
  Filled 2013-06-30: qty 1

## 2013-06-30 MED ORDER — MIDAZOLAM HCL 2 MG/2ML IJ SOLN
INTRAMUSCULAR | Status: AC
  Filled 2013-06-30: qty 2

## 2013-06-30 MED ORDER — ASPIRIN 81 MG PO CHEW
81.0000 mg | CHEWABLE_TABLET | ORAL | Status: DC
Start: 2013-07-01 — End: 2013-06-30

## 2013-06-30 MED ORDER — SODIUM CHLORIDE 0.9 % IJ SOLN: 3.0000 mL | INTRAMUSCULAR | Status: DC | PRN

## 2013-06-30 MED ORDER — SODIUM CHLORIDE 0.9 % IJ SOLN: 3.0000 mL | Freq: Two times a day (BID) | INTRAMUSCULAR | Status: DC

## 2013-06-30 MED ORDER — VERAPAMIL HCL 2.5 MG/ML IV SOLN
INTRAVENOUS | Status: AC
  Filled 2013-06-30: qty 2

## 2013-06-30 MED ORDER — INSULIN ASPART 100 UNIT/ML ~~LOC~~ SOLN
0.0000 [IU] | Freq: Three times a day (TID) | SUBCUTANEOUS | Status: DC
  Administered 2013-07-02: 2 [IU] via SUBCUTANEOUS

## 2013-06-30 MED ORDER — FLUTICASONE PROPIONATE 50 MCG/ACT NA SUSP: 1.0000 | Freq: Every day | NASAL | Status: DC

## 2013-06-30 MED ORDER — SODIUM CHLORIDE 0.9 % IJ SOLN
3.0000 mL | Freq: Two times a day (BID) | INTRAMUSCULAR | Status: DC
  Administered 2013-07-01 – 2013-07-03 (×4): 3 mL via INTRAVENOUS

## 2013-06-30 MED ORDER — BACITRACIN-NEOMYCIN-POLYMYXIN 400-5-5000 EX OINT
1.0000 "application " | TOPICAL_OINTMENT | Freq: Two times a day (BID) | CUTANEOUS | Status: DC
  Filled 2013-06-30: qty 1

## 2013-06-30 MED ORDER — LINAGLIPTIN 5 MG PO TABS
5.0000 mg | ORAL_TABLET | Freq: Every day | ORAL | Status: DC
  Administered 2013-07-02 – 2013-07-03 (×2): 5 mg via ORAL
  Filled 2013-06-30 (×4): qty 1

## 2013-06-30 MED ORDER — HEPARIN (PORCINE) IN NACL 2-0.9 UNIT/ML-% IJ SOLN
INTRAMUSCULAR | Status: AC
  Filled 2013-06-30: qty 1000

## 2013-06-30 MED ORDER — SIMVASTATIN 40 MG PO TABS
40.0000 mg | ORAL_TABLET | Freq: Every day | ORAL | Status: DC
Start: 2013-06-30 — End: 2013-07-09
  Administered 2013-06-30 – 2013-07-08 (×8): 40 mg via ORAL
  Filled 2013-06-30 (×10): qty 1

## 2013-06-30 MED ORDER — LEVOTHYROXINE SODIUM 100 MCG PO TABS
100.0000 ug | ORAL_TABLET | Freq: Every day | ORAL | Status: DC
  Administered 2013-07-01 – 2013-07-09 (×8): 100 ug via ORAL
  Filled 2013-06-30 (×14): qty 1

## 2013-06-30 MED ORDER — SODIUM CHLORIDE 0.9 % IV SOLN: 250.0000 mL | INTRAVENOUS | Status: DC | PRN

## 2013-06-30 MED ORDER — NITROGLYCERIN 0.2 MG/ML ON CALL CATH LAB
INTRAVENOUS | Status: AC
  Filled 2013-06-30: qty 1

## 2013-06-30 MED ORDER — LOSARTAN POTASSIUM 50 MG PO TABS
100.0000 mg | ORAL_TABLET | Freq: Every day | ORAL | Status: DC
  Administered 2013-07-01 – 2013-07-03 (×3): 100 mg via ORAL
  Filled 2013-06-30 (×4): qty 2

## 2013-06-30 MED ORDER — LIDOCAINE HCL (PF) 1 % IJ SOLN
INTRAMUSCULAR | Status: AC
  Filled 2013-06-30: qty 30

## 2013-06-30 MED ORDER — SODIUM CHLORIDE 0.9 % IV SOLN
1.0000 mL/kg/h | INTRAVENOUS | Status: AC
  Administered 2013-06-30: 1 mL/kg/h via INTRAVENOUS

## 2013-06-30 MED ORDER — NITROGLYCERIN 0.4 MG SL SUBL
0.4000 mg | SUBLINGUAL_TABLET | SUBLINGUAL | Status: DC | PRN
Start: 2013-06-30 — End: 2013-07-04

## 2013-06-30 MED ORDER — FENTANYL CITRATE 0.05 MG/ML IJ SOLN
INTRAMUSCULAR | Status: AC
  Filled 2013-06-30: qty 2

## 2013-06-30 MED ORDER — SODIUM CHLORIDE 0.9 % IV SOLN
INTRAVENOUS | Status: DC
  Administered 2013-06-30: 11:00:00 via INTRAVENOUS

## 2013-06-30 MED ORDER — ASPIRIN EC 81 MG PO TBEC
81.0000 mg | DELAYED_RELEASE_TABLET | Freq: Every day | ORAL | Status: DC
  Administered 2013-07-01 – 2013-07-03 (×3): 81 mg via ORAL
  Filled 2013-06-30 (×4): qty 1

## 2013-06-30 MED ORDER — SODIUM CHLORIDE 0.9 % IV SOLN: INTRAVENOUS | Status: DC

## 2013-06-30 MED ORDER — HEPARIN (PORCINE) IN NACL 100-0.45 UNIT/ML-% IJ SOLN
800.0000 [IU]/h | INTRAMUSCULAR | Status: DC
  Administered 2013-06-30: 850 [IU]/h via INTRAVENOUS
  Administered 2013-07-01: 1000 [IU]/h via INTRAVENOUS
  Administered 2013-07-02 – 2013-07-03 (×2): 800 [IU]/h via INTRAVENOUS
  Filled 2013-06-30 (×4): qty 250

## 2013-06-30 MED ORDER — BACITRACIN-NEOMYCIN-POLYMYXIN OINTMENT TUBE
TOPICAL_OINTMENT | Freq: Two times a day (BID) | CUTANEOUS | Status: DC
  Administered 2013-06-30 – 2013-07-02 (×3): via TOPICAL
  Administered 2013-07-03: 1 via TOPICAL
  Administered 2013-07-03: 10:00:00 via TOPICAL
  Filled 2013-06-30 (×2): qty 15

## 2013-06-30 NOTE — Interval H&P Note (Signed)
History and Physical Interval Note:  06/30/2013 10:22 AM  Amanda Curtis  has presented today for surgery, with the diagnosis of positive stress  The various methods of treatment have been discussed with the patient and family. After consideration of risks, benefits and other options for treatment, the patient has consented to  Procedure(s): LEFT HEART CATHETERIZATION WITH CORONARY ANGIOGRAM (N/A) as a surgical intervention .  The patient's history has been reviewed, patient examined, no change in status, stable for surgery.  I have reviewed the patient's chart and labs.  Questions were answered to the patient's satisfaction.    Cath Lab Visit (complete for each Cath Lab visit)  Clinical Evaluation Leading to the Procedure:   ACS: no  Non-ACS:    Anginal Classification: CCS III  Anti-ischemic medical therapy: Minimal Therapy (1 class of medications)  Non-Invasive Test Results: Intermediate-risk stress test findings: cardiac mortality 1-3%/year  Prior CABG: No previous CABG  Sherren Mocha

## 2013-06-30 NOTE — Progress Notes (Signed)
ANTICOAGULATION CONSULT NOTE - Initial Consult  Pharmacy Consult for heparin Indication: chest pain/ACS  Allergies  Allergen Reactions  . Atorvastatin Other (See Comments)    Severe muscle cramping  . Elavil [Amitriptyline] Other (See Comments)    Tongue swelling  . Hytrin [Terazosin] Other (See Comments)    syncope  . Erythromycin Rash    Patient Measurements: Height: 5\' 6"  (167.6 cm) Weight: 156 lb 1.4 oz (70.8 kg) IBW/kg (Calculated) : 59.3 Heparin Dosing Weight: 70kg  Vital Signs: Temp: 98.9 F (37.2 C) (05/04 1438) Temp src: Oral (05/04 1438) BP: 161/74 mmHg (05/04 1530) Pulse Rate: 46 (05/04 1530)  Labs: No results found for this basename: HGB, HCT, PLT, APTT, LABPROT, INR, HEPARINUNFRC, CREATININE, CKTOTAL, CKMB, TROPONINI,  in the last 72 hours  Estimated Creatinine Clearance: 40.7 ml/min (by C-G formula based on Cr of 1.1).   Medical History: Past Medical History  Diagnosis Date  . Hypertension   . Arthritis   . Graves disease 2000    s/p RIA  . Diabetes mellitus without complication   . Insomnia     Medications:  See medication history  Assessment: 78 year old female presents to [facility] for elective cath, found to have severe three vessel CAD. Orders received post cath to start heparin infusion. Cath performed radially, no issues noted. Patient is not on any anticoagulants prior to admission.  Goal of Therapy:  Heparin level 0.3-0.7 units/ml Monitor platelets by anticoagulation protocol: Yes   Plan:  Start heparin infusion at 850 units/hr Check anti-Xa level in 8 hours and daily while on heparin Continue to monitor H&H and platelets  Erin Hearing PharmD., BCPS Clinical Pharmacist Pager [phone] 06/30/2013 3:44 PM

## 2013-06-30 NOTE — Care Management Note (Addendum)
    Page 1 of 1   07/02/2013     2:23:05 PM CARE MANAGEMENT NOTE 07/02/2013  Patient:  Amanda Curtis, Amanda Curtis   Account Number:  [id number]  Date Initiated:  06/30/2013  Documentation initiated by:  Elissa Hefty  Subjective/Objective Assessment:   adm w pos card cath, ch pain     Action/Plan:   lives alone, pcp pa Earlie Counts   Anticipated DC Date:  07/11/2013   Anticipated DC Plan:  [place]  CM consult      Choice offered to / List presented to:             Status of service:  In process, will continue to follow Medicare Important Message given?  YES (If response is "NO", the following Medicare IM given date fields will be blank) Date Medicare IM given:  07/01/2013 Date Additional Medicare IM given:    Discharge Disposition:    Per UR Regulation:  Reviewed for med. necessity/level of care/duration of stay  If discussed at [place] of Stay Meetings, dates discussed:    Comments:  07/02/13 Ellan Lambert, RN, BSN [phone] Pt for CABG on 07/04/13.  Will discuss dc plans with pt, and follow for dc needs.

## 2013-06-30 NOTE — H&P (Signed)
[name] Date of Birth              August 17, 1935        [facility] [address], [place], [place]  [address]                                  [place], [place]  [address] [address]   Fax  [phone]                                          Fax [phone]   Problem List: 1. Hypertension 2. Chest pain-abnormal stress Myoview study 3. Hypothyroidism 4. Hyperlipidemia   History of Present Illness:   Feb. 11, 2015: Cc:  Work in visit for + [name]   [name] is a 78 year old female who recently moved from [place] ( saw Dr. Lacie Draft, [place] ).   She has a history of hypertension. She's been having some episodes of chest discomfort.      She saw Nance Pear., NP   for general medical care. At that time she complained of some chest pain and fatigue and dizziness.     She was seen by a cardiologist in [place] and has had a normal stress test.   She was seen in the ER last month for chest / neck pressure and fatigue and dizzines. She was referred for a Myoview study. Today she walked on a standard Bruce protocol treadmill test. She was unable to achieve target heart rate but did have significant ST segment depression. She was given a Tax inspector.  Her myoview images reveal mid-basil lateral ischemia.     She was worked into my DOD schedule for further evaluation.   She used to exercise regularly - until 2 years ago when she moved to [place] form [place].  She has developed fatigue with exertion.  She attributes her symptoms to her medications ( BP meds, generic synthroid)   Non smoker Rare ETOH Brother had CABG- late 40's   June 17, 2013:   Amanda Curtis presents for follow up visit. With we initially set her up for cardiac  catheterization but she did not show for cath. We also started her on atorvastatin during that visit.  She stopped it.  The patient requested a second opinion from cardiology. She saw Dr. Wyline Copas [facility])  who performed a cardiac CT which confirmed severe 3 vessel CAD   She still has some CP with ambulation.      Current Outpatient Prescriptions on File Prior to Visit   Medication  Sig  Dispense  Refill   .  aspirin EC 81 MG tablet  Take 81 mg by mouth daily.         .  hydrochlorothiazide (HYDRODIURIL) 25 MG tablet  Take 1 tablet (25 mg total) by mouth daily.   30 tablet   0   .  isosorbide mononitrate (IMDUR) 30 MG 24 hr tablet  Take 30 mg by mouth daily.         [name]  linagliptin (TRADJENTA) 5 MG TABS tablet  Take 5 mg by mouth daily.         [name]  losartan (COZAAR) 100 MG tablet  Take 100 mg by mouth daily.         .  nitroGLYCERIN (NITROSTAT) 0.4 MG SL tablet  Place 0.4 mg under the tongue every 5 (five) minutes as needed for chest pain.         [name]  SYNTHROID 100 MCG tablet  take 1 tablet by mouth once daily   30 tablet   1       No current facility-administered medications on file prior to visit.       Allergies   Allergen  Reactions   .  Atorvastatin  Other (See Comments)       Severe muscle cramping   .  Elavil [Amitriptyline]  Other (See Comments)       Tongue swelling   .  Hytrin [Terazosin]  Other (See Comments)       syncope   .  Erythromycin  Rash         Past Medical History   Diagnosis  Date   .  Hypertension     .  Arthritis     .  Graves disease  2000       s/p RIA   .  Diabetes mellitus without complication     .  Insomnia           Past Surgical History   Procedure  Laterality  Date   .  Appendectomy       .  Nasal septum surgery           childhood         History   Smoking status   .  Never Smoker    Smokeless tobacco   .  Never Used         History   Alcohol Use   .  Yes       Comment: 1 glass wine every 6 months         Family  History   Problem  Relation  Age of Onset   .  Dementia  Mother     .  Cancer  Mother         breast   .  Diabetes  Father     .  Diabetes  Paternal Grandmother     .  Hypertension  Paternal Grandmother          Reviw of Systems:   Reviewed in the HPI.  All other systems are negative.   Physical Exam: Blood pressure 152/100, pulse 52, height 5\' 6"  (1.676 m), weight 162 lb 6.4 oz (73.664 kg). Wt Readings from Last 3 Encounters:   06/17/13  162 lb 6.4 oz (73.664 kg)   06/04/13  163 lb (73.936 kg)   04/30/13  159 lb 1.3 oz (72.158 kg)          General: Well developed, well nourished, in no acute distress.   Head: Normocephalic, atraumatic, sclera  non-icteric, mucus membranes are moist,    Neck: Supple. Carotids are 2 + without bruits. No JVD    Lungs: Clear    Heart: RR, normal S1, S2   Abdomen: Soft, non-tender, non-distended with normal bowel sounds.   Msk:  Strength and tone are normal    Extremities: No clubbing or cyanosis. No edema.  Distal pedal pulses are 2+ and equal      Neuro: CN II - XII intact.  Alert and oriented X 3.    Psych:  Normal    ECG: NSR, no ST / T wave changes.    During the treadmill, she developed 1-2 mm of ST depression in the lateral leads.   Assessment / Plan:   She was referred to our office for further evaluation and neck and chest pressure. He's symptoms typically occur with exertion. Today she had ST segment changes on her ECG and she had a lateral defect on the Myoview images.  She has had treadmill tests in the past which were reportedly normal but clearly she has some abnormalities today.  It is possible that this is a false positive. We discussed this possibility but given the ST segment depression in the lateral defect and her symptoms I think that we should proceed with cardiac catheterization.   We discussed cardiac catheterization including risks, benefits, and options. She understands and agrees to  proceed. We'll schedule Friday.    she seems quite anxious about everything.         Coronary artery disease    Amanda Curtis presents today for followup. She has talked with Jeri [facility]. She has decided that she will have a cardiac catheterization.  Saw her several months ago, she is going to have cardiology in and had a second opinion. She apparently had a CT angiogram which revealed three-vessel coronary artery disease.      She now agrees to have a cardiac cath.. We discussed the risks, benefits, and options of cardiac cath. She understands and agrees to proceed. We'll get precath labs next week. She needs to have a cardiac catheterization on Monday, May 4 we will start her on pravastatin 40 mg a day. We tried her on atorvastatin but she developed an episode of leg cramping and does not want to take atorvastatin again

## 2013-06-30 NOTE — Consult Note (Signed)
Amanda Curtis       [place] [address]             [phone]      Cardiothoracic Surgery Consultation   Reason for Consult: Severe multivessel coronary artery disease with unstable angina  Referring Physician: Dr. Sherren Curtis Primary Cardiologist: Dr. Liam Rogers  Amanda Curtis is an 78 y.o. female.  HPI:   The patient has a history of diabetes, hypertension, hypercholesterolemia, and a family history of premature coronary artery disease. She reports having chest discomfort, fatigue, dizziness and generalized weakness over the past year that has been worsening. She moved to [place] from [place] about 2 years ago and says she had a previous stress test in [place] that was normal. She recently underwent a Myoview stress test here that was an intermediate risk study showing a moderate area of ischemia in the inferolateral and anterolateral walls. She was scheduled for a cath but did not show up because she was scared and requested a second opinion from Dr. Wyline Copas in [facility]. She had a cardiac CT which showed a high calcium score and evidence of severe 3-vessel CAD. Cath today shows severe 3-vessel CAD as noted below.  Past Medical History  Diagnosis Date  . Hypertension   . Arthritis   . Graves disease 2000    s/p RIA  . Diabetes mellitus without complication   . Insomnia     Past Surgical History  Procedure Laterality Date  . Appendectomy    . Nasal septum surgery      childhood    Family History  Problem Relation Age of Onset  . Dementia Mother   . Cancer Mother     breast  . Diabetes Father   . Diabetes Paternal Grandmother   . Hypertension Paternal Grandmother   * Her brother had CAD in his 47's and had CABG at age 40  Social History:  reports that she has never smoked. She has never used smokeless tobacco. She reports that she drinks alcohol. She reports that she does not use illicit drugs.  Allergies:  Allergies  Allergen  Reactions  . Atorvastatin Other (See Comments)    Severe muscle cramping  . Elavil [Amitriptyline] Other (See Comments)    Tongue swelling  . Hytrin [Terazosin] Other (See Comments)    syncope  . Erythromycin Rash    Medications:  I have reviewed the patient's current medications. Prior to Admission:  Prescriptions prior to admission  Medication Sig Dispense Refill  . aspirin EC 81 MG tablet Take 81 mg by mouth daily.      . Fluticasone Propionate (FLONASE NA) Place 1 spray into the nose daily as needed (for allergies).       . hydrochlorothiazide (HYDRODIURIL) 25 MG tablet Take 1 tablet (25 mg total) by mouth daily.  30 tablet  0  . levothyroxine (SYNTHROID, LEVOTHROID) 100 MCG tablet Take 100 mcg by mouth daily before breakfast.      . linagliptin (TRADJENTA) 5 MG TABS tablet Take 5 mg by mouth daily.      [name] losartan (COZAAR) 100 MG tablet Take 100 mg by mouth daily.      . pravastatin (PRAVACHOL) 40 MG tablet Take 1 tablet (40 mg total) by mouth every evening.  90 tablet  3  . neomycin-bacitracin-polymyxin (NEOSPORIN) ointment Apply 1 application topically every 12 (twelve) hours. apply to back of neck      . nitroGLYCERIN (NITROSTAT) 0.4 MG  SL tablet Place 0.4 mg under the tongue every 5 (five) minutes as needed for chest pain.       Scheduled: . [START ON 07/01/2013] aspirin EC  81 mg Oral Daily  . [START ON 07/01/2013] hydrochlorothiazide  25 mg Oral Daily  . insulin aspart  0-15 Units Subcutaneous TID WC  . [START ON 07/01/2013] levothyroxine  100 mcg Oral QAC breakfast  . [START ON 07/01/2013] linagliptin  5 mg Oral Daily  . [START ON 07/01/2013] losartan  100 mg Oral Daily  . neomycin-bacitracin-polymyxin   Topical BID  . simvastatin  40 mg Oral q1800  . sodium chloride  3 mL Intravenous Q12H   Continuous: . sodium chloride 1 mL/kg/hr (06/30/13 1500)  . heparin     XHB:ZJIRCV chloride, nitroGLYCERIN, sodium chloride Anti-infectives   None      Results for orders placed  during the hospital encounter of 06/30/13 (from the past 48 hour(s))  GLUCOSE, CAPILLARY     Status: Abnormal   Collection Time    06/30/13 10:54 AM      Result Value Ref Range   Glucose-Capillary 112 (*) 70 - 99 mg/dL  GLUCOSE, CAPILLARY     Status: None   Collection Time    06/30/13  1:58 PM      Result Value Ref Range   Glucose-Capillary 94  70 - 99 mg/dL  MRSA PCR SCREENING     Status: None   Collection Time    06/30/13  3:33 PM      Result Value Ref Range   MRSA by PCR NEGATIVE  NEGATIVE   Comment:            The GeneXpert MRSA Assay (FDA     approved for NASAL specimens     only), is one component of a     comprehensive MRSA colonization     surveillance program. It is not     intended to diagnose MRSA     infection nor to guide or     monitor treatment for     MRSA infections.  GLUCOSE, CAPILLARY     Status: None   Collection Time    06/30/13  4:01 PM      Result Value Ref Range   Glucose-Capillary 79  70 - 99 mg/dL    No results found.  Review of Systems  Constitutional: Positive for malaise/fatigue. Negative for fever, chills and weight loss.  HENT: Negative.   Eyes: Negative.   Respiratory: Negative for cough and shortness of breath.   Cardiovascular: Positive for chest pain.       Usually occuring at night when she lays down. Also occurs with exertion.  No orthopnea, PND, or peripheral edema  Gastrointestinal: Negative.   Genitourinary: Negative.   Musculoskeletal: Negative.   Skin: Negative.   Neurological: Positive for dizziness.  Endo/Heme/Allergies: Negative.   Psychiatric/Behavioral: Negative.    Blood pressure 118/64, pulse 62, temperature 97.8 F (36.6 C), temperature source Oral, resp. rate 12, height 5\' 6"  (1.676 m), weight 70.8 kg (156 lb 1.4 oz), SpO2 3.00%. Physical Exam  Constitutional: She is oriented to person, place, and time. She appears well-developed and well-nourished. No distress.  HENT:  Head: Normocephalic and atraumatic.    Mouth/Throat: Oropharynx is clear and moist.  Eyes: EOM are normal. Pupils are equal, round, and reactive to light.  Neck: Normal range of motion. Neck supple. No JVD present. No thyromegaly present.  Cardiovascular: Normal rate, regular rhythm, normal heart sounds and intact  distal pulses.   No murmur heard. Respiratory: Effort normal. No respiratory distress. She has rales.  GI: Soft. Bowel sounds are normal. She exhibits no distension and no mass. There is no tenderness.  Musculoskeletal: She exhibits no edema.  Lymphadenopathy:    She has no cervical adenopathy.  Neurological: She is alert and oriented to person, place, and time. She has normal strength. A cranial nerve deficit is present. No sensory deficit.  Skin: Skin is warm and dry.  Psychiatric: She has a normal mood and affect.     [address]  [place], [place] [address]  [phone]    Cardiology Nuclear Med Study  Amanda Curtis is a 78 y.o. female MRN : [id number] DOB: [birth year]  Procedure Date: 04/09/2013  Nuclear Med Background  Indication for Stress Test: Evaluation for Ischemia, and Patient seen in hospital on 03-28-2013 for Chest Pain, Abnormal EKG, Enzymes Negative  History: No known CAD  Cardiac Risk Factors: Family History - CAD, Hypertension, Lipids and NIDDM  Symptoms: Chest Pain (last date of chest discomfort was a few weeks ago) and Dizziness  Nuclear Pre-Procedure  Caffeine/Decaff Intake: None > 12 hrs  NPO After: 9:00pm   Lungs: clear  O2 Sat: 98% on room air.  IV 0.9% NS with Angio Cath: 22g   IV Site: R Antecubital x 1, tolerated well  IV Started by: Amanda Baltimore, Amanda Curtis   Chest Size (in): 38  Cup Size: D   Height: 5\' 5"  (1.651 m)  Weight: 153 lb (69.4 kg)   BMI: Body mass index is 25.46 kg/(m^2).  Tech Comments: Held Tradjenta this am   Nuclear Med Study  1 or 2 day study: 1 day  Stress Test Type: Lexiscan   Reading Amanda Curtis: N/A  Order Authorizing  Provider: Penni Homans, Amanda Curtis, and Amanda Alar, Amanda Curtis   Resting Radionuclide: Technetium 43m Sestamibi  Resting Radionuclide Dose: 11.0 mCi   Stress Radionuclide: Technetium 48m Sestamibi  Stress Radionuclide Dose: 33.0 mCi   Stress Protocol  Rest HR: 55  Stress HR: 90   Rest BP: 153/91  Stress BP: 143/72   Exercise Time (min): n/a  METS: n/a    Dose of Adenosine (mg): n/a  Dose of Lexiscan: 0.4 mg   Dose of Atropine (mg): n/a  Dose of Dobutamine: n/a mcg/kg/min (at max HR)   Stress Test Technologist: Amanda Curtis, Amanda Curtis  Nuclear Technologist: Amanda Curtis, Amanda Curtis   Rest Procedure: Myocardial perfusion imaging was performed at rest 45 minutes following the intravenous administration of Technetium 26m Sestamibi.  Rest ECG: Normal sinus rhythm. Very mild ST flattening  Stress Procedure: The patient received IV Lexiscan 0.4 mg over 15-seconds. Technetium 47m Sestamibi injected at 30-seconds. Quantitative spect images were obtained after a 45 minute delay. The patient attempted Bruce Protocol. She became very fatigued and heart rate would not go up. Changed to [place]. She continued feeling fatigued with the [place].  Stress ECG: There is 1 mm of ST depression that is flat has a heart rate increases  QPS  Raw Data Images: Normal; no motion artifact; normal heart/lung ratio.  Stress Images: There is a medium-sized area of moderate decreased uptake affecting the base/mid inferolateral segments and the base/mid/anterolateral segments and the apical lateral segment. This area is partially reversible  Rest Images: Small/medium area of mild decreased uptake at the base/mid inferolateral segments and the base anterolateral segment.  Subtraction (SDS): There is ischemia present.  Transient Ischemic Dilatation (Normal <  1.22): 0.73  Lung/Heart Ratio (Normal <0.45): 0.29  Quantitative Gated Spect Images  QGS EDV: 46 ml  QGS ESV: 12 ml  Impression  Exercise Capacity: The patient started out walking on  the treadmill. He developed marked fatigue. There was no definite chest pain. However as the heart rate increased into the range of 90 there was ST flattening with ST depression of 1 mm. Because of the marked fatigue and inadequate heart rate response, the study was changed to [employer].  BP Response: Normal blood pressure response.  Clinical Symptoms: The patient had a sensation of marked fatigue both when walking and when at rest with infusion  ECG Impression: When walking on the treadmill the patient had 1 mm of flat ST depression consistent with ischemia.  Comparison with Prior Nuclear Study: No images to compare  Overall Impression: The study is abnormal. This is a moderate risk scan. The patient had marked fatigue that is probably ischemia for him. There were EKG abnormalities. The nuclear images reveal a moderate area of ischemia in the inferolateral wall and anterolateral walls.  LV Ejection Fraction: 73%. LV Wall Motion: Normal Wall Motion.  Amanda Argyle, Amanda Curtis    Cardiac Catheterization Procedure Note  Name: Amanda Curtis  MRN: [id number]  DOB: [birth year]  Procedure: Left Heart Cath, Selective Coronary Angiography, LV angiography  Indication: Daily chest burning. 3 vessel CAD by CT, abnormal Myoview stress test, diabetes.  Procedural details: Initially the right wrist was prepped, draped, and anesthetized with 1% lidocaine. A 5 French sheath was advanced into the right radial artery without difficulty. There was resistance at the elbow with passage of wires. A radial artery angiogram was performed and demonstrated a radio-ulnar loop. I was able to pass a cougar wire around the loop, but a 4 French endhole catheter would not advance. Attention was then turned to the right groin. The right groin was prepped, draped, and anesthetized with 1% lidocaine. Using modified Seldinger technique, a 4 French sheath was introduced into the right femoral artery. Standard Judkins catheters were used for  coronary angiography and left ventriculography. The JR 4 catheter was used to perform left subclavian angiography. Catheter exchanges were performed over a guidewire. There were no immediate procedural complications. The patient was transferred to the post catheterization recovery area for further monitoring.  Procedural Findings:  Hemodynamics:  AO 116/61 with a mean of 83  LV 114/5  Coronary angiography:  Coronary dominance: right  Left mainstem: The left main is heavily calcified. The vessel has 30-40% distal stenosis before it divides into the LAD and left circumflex.  Left anterior descending (LAD): The LAD has severe diffuse calcification and severe diffuse stenosis to the proximal and midportion. There are sequential 80% proximal LAD stenoses then 95-99% mid LAD stenosis just after the first diagonal. The diagonal branch has diffuse 90% stenosis. The LAD is then diffusely diseased down to the apex with nonobstructive stenosis distally, but 60-70% stenosis at the origin of the third diagonal branch.  Left circumflex (LCx): The left circumflex as heavy calcification. The proximal vessel is patent. The first obtuse marginal divides into twin vessels essentially supplying OM 1 and OM 2 territories. Prior to the bifurcation there is 90% stenosis present.  Right coronary artery (RCA): The RCA is a large, dominant vessel. The vessel has severe diffuse calcification and severe 95% mid vessel stenosis. The distal vessel before the bifurcation of the PDA and PLA branches has 60-70% stenosis. The PDA and PLA branches are patent.  Left subclavian  angiography: The subclavian has heavy calcification at its origin. There is no significant stenosis. The LIMA appears suitable for an arterial conduit.  Left ventriculography: Deferred  Final Conclusions:  1. Severe native three-vessel coronary artery disease with severe calcific stenosis of the LAD, left circumflex, and right coronary arteries  2. Patent left  subclavian artery with patent left internal mammary artery  3. Known normal LV function by nuclear stress test with LVEF 73%  Recommendations: Hospital admission in this patient with severe multivessel coronary artery disease and symptoms of daily chest burning consistent with unstable angina. We'll start her on IV heparin.  Amanda Curtis  06/30/2013, 1:58 PM     Assessment/Plan:  She has severe multi-vessel coronary artery disease with unstable anginal symptoms. With the diffuse nature of her disease, diabetes, and relatively small coronary vessels I think CABG is the best treatment to resolve her symptoms, improve her quality of life and improve her long term prognosis. I discussed the operative procedure with the patient and her grandson including alternatives, benefits and risks; including but not limited to bleeding, blood transfusion, infection, stroke, myocardial infarction, graft failure, heart block requiring a permanent pacemaker, organ dysfunction, and death.  Amanda Curtis understands and agrees to proceed.  We will schedule surgery for Friday am.   Amanda Curtis 06/30/2013, 9:17 PM

## 2013-06-30 NOTE — CV Procedure (Signed)
   Cardiac Catheterization Procedure Note  Name: Amanda Curtis MRN: [id number] DOB: [birth year]  Procedure: Left Heart Cath, Selective Coronary Angiography, LV angiography  Indication: Daily chest burning. 3 vessel CAD by CT, abnormal Myoview stress test, diabetes.  Procedural details: Initially the right wrist was prepped, draped, and anesthetized with 1% lidocaine. A 5 French sheath was advanced into the right radial artery without difficulty. There was resistance at the elbow with passage of wires. A radial artery angiogram was performed and demonstrated a radio-ulnar loop. I was able to pass a cougar wire around the loop, but a 4 French endhole catheter would not advance. Attention was then turned to the right groin. The right groin was prepped, draped, and anesthetized with 1% lidocaine. Using modified Seldinger technique, a 4 French sheath was introduced into the right femoral artery. Standard Judkins catheters were used for coronary angiography and left ventriculography. The JR 4 catheter was used to perform left subclavian angiography. Catheter exchanges were performed over a guidewire. There were no immediate procedural complications. The patient was transferred to the post catheterization recovery area for further monitoring.  Procedural Findings: Hemodynamics:  AO 116/61 with a mean of 83 LV 114/5   Coronary angiography: Coronary dominance: right  Left mainstem: The left main is heavily calcified. The vessel has 30-40% distal stenosis before it divides into the LAD and left circumflex.   Left anterior descending (LAD): The LAD has severe diffuse calcification and severe diffuse stenosis to the proximal and midportion. There are sequential 80% proximal LAD stenoses then 95-99% mid LAD stenosis just after the first diagonal. The diagonal branch has diffuse 90% stenosis. The LAD is then diffusely diseased down to the apex with nonobstructive stenosis distally, but 60-70% stenosis at the  origin of the third diagonal branch.  Left circumflex (LCx): The left circumflex as heavy calcification. The proximal vessel is patent. The first obtuse marginal divides into twin vessels essentially supplying OM 1 and OM 2 territories. Prior to the bifurcation there is 90% stenosis present.  Right coronary artery (RCA): The RCA is a large, dominant vessel. The vessel has severe diffuse calcification and severe 95% mid vessel stenosis. The distal vessel before the bifurcation of the PDA and PLA branches has 60-70% stenosis. The PDA and PLA branches are patent.  Left subclavian angiography: The subclavian has heavy calcification at its origin. There is no significant stenosis. The LIMA appears suitable for an arterial conduit.  Left ventriculography: Deferred  Final Conclusions:   1. Severe native three-vessel coronary artery disease with severe calcific stenosis of the LAD, left circumflex, and right coronary arteries 2. Patent left subclavian artery with patent left internal mammary artery 3. Known normal LV function by nuclear stress test with LVEF 73%  Recommendations: Hospital admission in this patient with severe multivessel coronary artery disease and symptoms of daily chest burning consistent with unstable angina. We'll start her on IV heparin.  Sherren Mocha 06/30/2013, 1:58 PM

## 2013-06-30 NOTE — Research (Signed)
AVERT Informed Consent   Subject Name: Amanda Curtis  Subject met inclusion and exclusion criteria.  The informed consent form, study requirements and expectations were reviewed with the subject and questions and concerns were addressed prior to the signing of the consent form.  The subject verbalized understanding of the trail requirements.  The subject agreed to participate in the AVERT trial and signed the informed consent.  The informed consent was obtained prior to performance of any protocol-specific procedures for the subject.  A copy of the signed informed consent was given to the subject and a copy was placed in the subject's medical record.  [name] Lyn Debbi Strandberg 06/30/2013, 10:20 AM

## 2013-06-30 NOTE — Interval H&P Note (Signed)
History and Physical Interval Note:  06/30/2013 12:41 PM  [place]  has presented today for surgery, with the diagnosis of positive stress  The various methods of treatment have been discussed with the patient and family. After consideration of risks, benefits and other options for treatment, the patient has consented to  Procedure(s): LEFT HEART CATHETERIZATION WITH CORONARY ANGIOGRAM (N/A) as a surgical intervention .  The patient's history has been reviewed, patient examined, no change in status, stable for surgery.  I have reviewed the patient's chart and labs.  Questions were answered to the patient's satisfaction.     Sherren Mocha

## 2013-07-01 DIAGNOSIS — I200 Unstable angina: Secondary | ICD-10-CM

## 2013-07-01 DIAGNOSIS — I2510 Atherosclerotic heart disease of native coronary artery without angina pectoris: Secondary | ICD-10-CM

## 2013-07-01 LAB — GLUCOSE, CAPILLARY
GLUCOSE-CAPILLARY: 116 mg/dL — AB (ref 70–99)
Glucose-Capillary: 93 mg/dL (ref 70–99)
Glucose-Capillary: 129 mg/dL — ABNORMAL HIGH (ref 70–99)
Glucose-Capillary: 120 mg/dL — ABNORMAL HIGH (ref 70–99)

## 2013-07-01 LAB — CBC
HEMATOCRIT: 39.2 % (ref 36.0–46.0)
HEMOGLOBIN: 12.7 g/dL (ref 12.0–15.0)
MCH: 30.8 pg (ref 26.0–34.0)
MCHC: 32.4 g/dL (ref 30.0–36.0)
MCV: 95.1 fL (ref 78.0–100.0)
Platelets: 361 10*3/uL (ref 150–400)
RBC: 4.12 MIL/uL (ref 3.87–5.11)
RDW: 14.6 % (ref 11.5–15.5)
WBC: 8.3 10*3/uL (ref 4.0–10.5)

## 2013-07-01 LAB — HEPARIN LEVEL (UNFRACTIONATED)
HEPARIN UNFRACTIONATED: 0.26 [IU]/mL — AB (ref 0.30–0.70)
HEPARIN UNFRACTIONATED: 0.45 [IU]/mL (ref 0.30–0.70)

## 2013-07-01 MED ORDER — ZOLPIDEM TARTRATE 5 MG PO TABS
5.0000 mg | ORAL_TABLET | Freq: Once | ORAL | Status: AC
  Administered 2013-07-01: 5 mg via ORAL
  Filled 2013-07-01: qty 1

## 2013-07-01 NOTE — Progress Notes (Signed)
Pt received into room 2w01, pt resting in bed with no complaints, vitals taken, and tele placed on pt, pt oriented to room and call ball, will continue to monitor Rickard Rhymes, RN

## 2013-07-01 NOTE — Progress Notes (Signed)
1 Day Post-Op Procedure(s) (LRB): LEFT HEART CATHETERIZATION WITH CORONARY ANGIOGRAM (N/A) Subjective: No chest pain or dyspnea  Objective: Vital signs in last 24 hours: Temp:  [97.8 F (36.6 C)-98.2 F (36.8 C)] 98.1 F (36.7 C) (05/05 1148) Pulse Rate:  [52-68] 64 (05/05 1400) Cardiac Rhythm:  [-] Sinus bradycardia (05/05 1500) Resp:  [12-22] 20 (05/05 1148) BP: (97-166)/(44-115) 112/69 mmHg (05/05 1516) SpO2:  [3 %-100 %] 96 % (05/05 1516)  Hemodynamic parameters for last 24 hours:    Intake/Output from previous day: 05/04 0701 - 05/05 0700 In: 945.7 [P.O.:120; I.V.:825.7] Out: 900 [Urine:900] Intake/Output this shift: Total I/O In: 720 [P.O.:580; I.V.:140] Out: -   General appearance: alert and cooperative Heart: regular rate and rhythm, S1, S2 normal, no murmur, click, rub or gallop Lungs: clear to auscultation bilaterally  Lab Results:  Recent Labs  07/01/13 0326  WBC 8.3  HGB 12.7  HCT 39.2  PLT 361   BMET: No results found for this basename: NA, K, CL, CO2, GLUCOSE, BUN, CREATININE, CALCIUM,  in the last 72 hours  PT/INR: No results found for this basename: LABPROT, INR,  in the last 72 hours ABG No results found for this basename: phart, pco2, po2, hco3, tco2, acidbasedef, o2sat   CBG (last 3)   Recent Labs  06/30/13 2114 07/01/13 0800 07/01/13 1151  GLUCAP 153* 116* 93    Assessment/Plan: S/P Procedure(s) (LRB): LEFT HEART CATHETERIZATION WITH CORONARY ANGIOGRAM (N/A) Severe multi-vessel coronary artery disease. Plan CABG Friday.   LOS: 1 day    Gaye Pollack 07/01/2013

## 2013-07-01 NOTE — Progress Notes (Signed)
CARDIAC REHAB PHASE I   PRE:  Rate/Rhythm: 57 SB  BP:  Supine:   Sitting: 130/84  Standing:    SaO2: 100 RA  MODE:  Ambulation: 350 ft   POST:  Rate/Rhythm: 58 SB  BP:  Supine:   Sitting: 124/44  Standing:    SaO2: 94 RA 1135-1230 Assisted X 1 to ambulate. Gait steady. Pt able to walk 350 feet without c/o of cp or SOB. VS stable Pt to recliner after walk with call light in reach. Gave pt going for Heart surgery booklet and surgery pt care guide. Answered pt's questions related to surgery. We will continue to follow pt.  Rodney Langton RN 07/01/2013 12:31 PM

## 2013-07-01 NOTE — Progress Notes (Signed)
SUBJECTIVE:  No pain.  No SOB   PHYSICAL EXAM Filed Vitals:   06/30/13 2323 07/01/13 0411 07/01/13 0755 07/01/13 0800  BP: 105/56 97/44 130/115 125/104  Pulse: 58 52 57 57  Temp: 97.9 F (36.6 C) 98 F (36.7 C) 98.2 F (36.8 C)   TempSrc: Oral Oral Oral   Resp: 12 15 18 18   Height:      Weight:      SpO2: 97% 99% 100% 97%   General:  No distress Lungs:  Clear Heart:  RRR Abdomen:  Positive bowel sounds, no rebound no guarding Extremities:  Right wrist OK.  No edema   LABS:  Results for orders placed during the hospital encounter of 06/30/13 (from the past 24 hour(s))  GLUCOSE, CAPILLARY     Status: None   Collection Time    06/30/13  1:58 PM      Result Value Ref Range   Glucose-Capillary 94  70 - 99 mg/dL  MRSA PCR SCREENING     Status: None   Collection Time    06/30/13  3:33 PM      Result Value Ref Range   MRSA by PCR NEGATIVE  NEGATIVE  GLUCOSE, CAPILLARY     Status: None   Collection Time    06/30/13  4:01 PM      Result Value Ref Range   Glucose-Capillary 79  70 - 99 mg/dL  HEMOGLOBIN A1C     Status: Abnormal   Collection Time    06/30/13  4:30 PM      Result Value Ref Range   Hemoglobin A1C 6.4 (*) <5.7 %   Mean Plasma Glucose 137 (*) <117 mg/dL  GLUCOSE, CAPILLARY     Status: Abnormal   Collection Time    06/30/13  9:14 PM      Result Value Ref Range   Glucose-Capillary 153 (*) 70 - 99 mg/dL  CBC     Status: None   Collection Time    07/01/13  3:26 AM      Result Value Ref Range   WBC 8.3  4.0 - 10.5 K/uL   RBC 4.12  3.87 - 5.11 MIL/uL   Hemoglobin 12.7  12.0 - 15.0 g/dL   HCT 39.2  36.0 - 46.0 %   MCV 95.1  78.0 - 100.0 fL   MCH 30.8  26.0 - 34.0 pg   MCHC 32.4  30.0 - 36.0 g/dL   RDW 14.6  11.5 - 15.5 %   Platelets 361  150 - 400 K/uL  HEPARIN LEVEL (UNFRACTIONATED)     Status: Abnormal   Collection Time    07/01/13  3:26 AM      Result Value Ref Range   Heparin Unfractionated 0.26 (*) 0.30 - 0.70 IU/mL  GLUCOSE, CAPILLARY      Status: Abnormal   Collection Time    07/01/13  8:00 AM      Result Value Ref Range   Glucose-Capillary 116 (*) 70 - 99 mg/dL    Intake/Output Summary (Last 24 hours) at 07/01/13 1134 Last data filed at 07/01/13 1100  Gross per 24 hour  Intake 1385.69 ml  Output    900 ml  Net 485.69 ml     ASSESSMENT AND PLAN:  CAD:  CABG scheduled for Friday.     Continue current therapy.  Transfer.    HTN:  Continue current therapy.   HYPERLIPIDEMIA:  She was on Pravastatin.  Now on Zocor.  Continue this.  DM:  Continue current therapy.   Amanda Curtis [facility] 07/01/2013 11:34 AM

## 2013-07-01 NOTE — Progress Notes (Signed)
ANTICOAGULATION CONSULT NOTE - Follow Up Consult  Pharmacy Consult for Heparin  Indication: Severe multi-vessel disease awaiting CABG  Allergies  Allergen Reactions  . Atorvastatin Other (See Comments)    Severe muscle cramping  . Elavil [Amitriptyline] Other (See Comments)    Tongue swelling  . Hytrin [Terazosin] Other (See Comments)    syncope  . Erythromycin Rash   Patient Measurements: Height: 5\' 6"  (167.6 cm) Weight: 156 lb 1.4 oz (70.8 kg) IBW/kg (Calculated) : 59.3  Labs:  Recent Labs  07/01/13 0326 07/01/13 1140  HGB 12.7  --   HCT 39.2  --   PLT 361  --   HEPARINUNFRC 0.26* 0.45   Medications:  Heparin 850 unitshr  Assessment: 78 y/o F on heparin for multi-vessel disease awaiting CABG. HL is 0.45. CBC normal, no issues per RN. CABG planned for Friday. Continue heparin at current rate.  Goal of Therapy:  Heparin level 0.3-0.7 units/ml Monitor platelets by anticoagulation protocol: Yes   Plan:  -Continue heparin at 1000 units/hr -Daily CBC/HL -Monitor for bleeding  Erin Hearing PharmD., BCPS Clinical Pharmacist Pager [phone] 07/01/2013 1:24 PM

## 2013-07-01 NOTE — Progress Notes (Signed)
ANTICOAGULATION CONSULT NOTE - Follow Up Consult  Pharmacy Consult for Heparin  Indication: Severe multi-vessel disease awaiting CABG  Allergies  Allergen Reactions  . Atorvastatin Other (See Comments)    Severe muscle cramping  . Elavil [Amitriptyline] Other (See Comments)    Tongue swelling  . Hytrin [Terazosin] Other (See Comments)    syncope  . Erythromycin Rash   Patient Measurements: Height: 5\' 6"  (167.6 cm) Weight: 156 lb 1.4 oz (70.8 kg) IBW/kg (Calculated) : 59.3  Labs:  Recent Labs  07/01/13 0326  HGB 12.7  HCT 39.2  PLT 361  HEPARINUNFRC 0.26*   Medications:  Heparin 850 unitshr  Assessment: 78 y/o F on heparin for multi-vessel disease awaiting CABG. HL is 0.26. Other labs as above. No issues per RN.   Goal of Therapy:  Heparin level 0.3-0.7 units/ml Monitor platelets by anticoagulation protocol: Yes   Plan:  -Increase heparin to 1000 units/hr -1200 HL -Daily CBC/HL -Monitor for bleeding  Amanda Curtis 07/01/2013,4:10 AM

## 2013-07-02 ENCOUNTER — Inpatient Hospital Stay (HOSPITAL_COMMUNITY): Payer: Medicare Other

## 2013-07-02 DIAGNOSIS — Z01810 Encounter for preprocedural cardiovascular examination: Secondary | ICD-10-CM

## 2013-07-02 LAB — GLUCOSE, CAPILLARY
Glucose-Capillary: 125 mg/dL — ABNORMAL HIGH (ref 70–99)
Glucose-Capillary: 111 mg/dL — ABNORMAL HIGH (ref 70–99)
Glucose-Capillary: 85 mg/dL (ref 70–99)

## 2013-07-02 LAB — CBC
HCT: 39.7 % (ref 36.0–46.0)
HEMOGLOBIN: 12.7 g/dL (ref 12.0–15.0)
MCH: 30.6 pg (ref 26.0–34.0)
MCHC: 32 g/dL (ref 30.0–36.0)
MCV: 95.7 fL (ref 78.0–100.0)
Platelets: 352 10*3/uL (ref 150–400)
RBC: 4.15 MIL/uL (ref 3.87–5.11)
RDW: 14.5 % (ref 11.5–15.5)
WBC: 8.8 10*3/uL (ref 4.0–10.5)

## 2013-07-02 LAB — HEPARIN LEVEL (UNFRACTIONATED)
Heparin Unfractionated: 0.86 IU/mL — ABNORMAL HIGH (ref 0.30–0.70)
Heparin Unfractionated: 0.41 IU/mL (ref 0.30–0.70)

## 2013-07-02 MED ORDER — ZOLPIDEM TARTRATE 5 MG PO TABS
5.0000 mg | ORAL_TABLET | Freq: Every evening | ORAL | Status: DC | PRN
  Administered 2013-07-02: 5 mg via ORAL
  Filled 2013-07-02: qty 1

## 2013-07-02 NOTE — Progress Notes (Signed)
[place] for Heparin  Indication: Severe multi-vessel disease awaiting CABG  Allergies  Allergen Reactions  . Atorvastatin Other (See Comments)    Severe muscle cramping  . Elavil [Amitriptyline] Other (See Comments)    Tongue swelling  . Hytrin [Terazosin] Other (See Comments)    syncope  . Erythromycin Rash   Patient Measurements: Height: 5\' 6"  (167.6 cm) Weight: 156 lb 1.4 oz (70.8 kg) IBW/kg (Calculated) : 59.3  Labs:  Recent Labs  07/01/13 0326 07/01/13 1140 07/02/13 0332  HGB 12.7  --  12.7  HCT 39.2  --  39.7  PLT 361  --  352  HEPARINUNFRC 0.26* 0.45 0.86*   Assessment: 78 yo female with CAD, awaiting CABG, for heparin  Goal of Therapy:  Heparin level 0.3-0.7 units/ml Monitor platelets by anticoagulation protocol: Yes   Plan:  Decrease Heparin 800 units/hr Check heparin level in 6 hours.   Bronson Curb Aarya Robinson 07/02/2013,5:20 AM

## 2013-07-02 NOTE — Progress Notes (Signed)
[place] for Heparin  Indication: Severe multi-vessel disease awaiting CABG  Allergies  Allergen Reactions  . Atorvastatin Other (See Comments)    Severe muscle cramping  . Elavil [Amitriptyline] Other (See Comments)    Tongue swelling  . Hytrin [Terazosin] Other (See Comments)    syncope  . Erythromycin Rash   Patient Measurements: Height: 5\' 6"  (167.6 cm) Weight: 156 lb 1.4 oz (70.8 kg) IBW/kg (Calculated) : 59.3  Labs:  Recent Labs  07/01/13 0326 07/01/13 1140 07/02/13 0332 07/02/13 1120  HGB 12.7  --  12.7  --   HCT 39.2  --  39.7  --   PLT 361  --  352  --   HEPARINUNFRC 0.26* 0.45 0.86* 0.41   Assessment: 78 yo female on heparin for severe multivessel disease. Plan for CABG on Friday. Heparin level therapeutic. No bleeding noted.  Goal of Therapy:  Heparin level 0.3-0.7 units/ml Monitor platelets by anticoagulation protocol: Yes   Plan:  1) Continue Heparin at 800 units/hr 2) Daily heparin level and CBC  Sherlon Handing, PharmD, BCPS Clinical pharmacist, pager [phone] 07/02/2013,1:50 PM

## 2013-07-02 NOTE — Progress Notes (Signed)
VASCULAR LAB PRELIMINARY  PRELIMINARY  PRELIMINARY  PRELIMINARY  Pre-op Cardiac Surgery  Carotid Findings:  Right - 40% to 59% ICA stenosis. Vertebral artery flow is antegrade. Left - 1% to 39% ICA stenosis. Vertebral artery flow is antegrade  Upper Extremity Right Left  Brachial Pressures 126 Triphasic 115 Triphasic  Radial Waveforms Biphasic Triphasic  Ulnar Waveforms Biphasic Triphasic  Palmar Arch (Allen's Test) Normal Normal   Findings:  Palmar arch evaluation - Doppler waveforms remained normal bilaterally with both radial and ulnar compressions.    Lower  Extremity Right Left  Dorsalis Pedis 143 Biphasic 148 Biphasic  Posterior Tibial 146 Biphasic 142 Biphasic  Ankle/Brachial Indices 1.16 1.17    Findings:  ABIs and Doppler waveforms are within normal limits bilaterally at rest   [employer], [place] 07/02/2013, 2:53 PM

## 2013-07-02 NOTE — Progress Notes (Signed)
SUBJECTIVE:    78 yo with recent onset of CP, had a + stress myoview. Cath on May 5 revealed severe 3 V CAD.   No pain.  No SOB   PHYSICAL EXAM Filed Vitals:   07/01/13 1400 07/01/13 1516 07/01/13 2023 07/02/13 0440  BP:  112/69 134/65 113/58  Pulse: 64  84 54  Temp:   97.6 F (36.4 C) 97.7 F (36.5 C)  TempSrc:   Oral Oral  Resp:   20 18  Height:      Weight:      SpO2:  96% 100% 95%   General:  No distress Lungs:  Clear Heart:  RRR Abdomen:  Positive bowel sounds, no rebound no guarding Extremities:  Right wrist OK.  No edema   LABS:  Results for orders placed during the hospital encounter of 06/30/13 (from the past 24 hour(s))  HEPARIN LEVEL (UNFRACTIONATED)     Status: None   Collection Time    07/01/13 11:40 AM      Result Value Ref Range   Heparin Unfractionated 0.45  0.30 - 0.70 IU/mL  GLUCOSE, CAPILLARY     Status: None   Collection Time    07/01/13 11:51 AM      Result Value Ref Range   Glucose-Capillary 93  70 - 99 mg/dL  GLUCOSE, CAPILLARY     Status: Abnormal   Collection Time    07/01/13  4:01 PM      Result Value Ref Range   Glucose-Capillary 129 (*) 70 - 99 mg/dL   Comment 1 Notify RN     Comment 2 Documented in Chart    GLUCOSE, CAPILLARY     Status: Abnormal   Collection Time    07/01/13  9:32 PM      Result Value Ref Range   Glucose-Capillary 120 (*) 70 - 99 mg/dL  HEPARIN LEVEL (UNFRACTIONATED)     Status: Abnormal   Collection Time    07/02/13  3:32 AM      Result Value Ref Range   Heparin Unfractionated 0.86 (*) 0.30 - 0.70 IU/mL  CBC     Status: None   Collection Time    07/02/13  3:32 AM      Result Value Ref Range   WBC 8.8  4.0 - 10.5 K/uL   RBC 4.15  3.87 - 5.11 MIL/uL   Hemoglobin 12.7  12.0 - 15.0 g/dL   HCT 39.7  36.0 - 46.0 %   MCV 95.7  78.0 - 100.0 fL   MCH 30.6  26.0 - 34.0 pg   MCHC 32.0  30.0 - 36.0 g/dL   RDW 14.5  11.5 - 15.5 %   Platelets 352  150 - 400 K/uL  GLUCOSE, CAPILLARY     Status: Abnormal   Collection Time    07/02/13  6:12 AM      Result Value Ref Range   Glucose-Capillary 125 (*) 70 - 99 mg/dL    Intake/Output Summary (Last 24 hours) at 07/02/13 0920 Last data filed at 07/02/13 0700  Gross per 24 hour  Intake   1280 ml  Output      0 ml  Net   1280 ml     ASSESSMENT AND PLAN:  CAD:  CABG scheduled for Friday.     Continue current therapy.     HTN:  Continue current therapy.   HYPERLIPIDEMIA:  She was on Pravastatin.  Now on Zocor.  Continue this.    DM:  Continue current therapy.   Amanda Curtis 07/02/2013 9:20 AM

## 2013-07-02 NOTE — Progress Notes (Signed)
[place] to see pt to walk and do pre op ed. Pt has gone to vascular studies per family. Will follow up. Graylon Good RN BSN 07/02/2013 2:44 PM

## 2013-07-03 ENCOUNTER — Inpatient Hospital Stay (HOSPITAL_COMMUNITY): Payer: Medicare Other

## 2013-07-03 ENCOUNTER — Encounter (HOSPITAL_COMMUNITY): Payer: Self-pay | Admitting: Certified Registered"

## 2013-07-03 LAB — BLOOD GAS, ARTERIAL
Acid-base deficit: 3 mmol/L — ABNORMAL HIGH (ref 0.0–2.0)
BICARBONATE: 20.1 meq/L (ref 20.0–24.0)
Drawn by: 277331
FIO2: 0.21 %
O2 Saturation: 98.7 %
PATIENT TEMPERATURE: 98.6
PCO2 ART: 27.9 mmHg — AB (ref 35.0–45.0)
PH ART: 7.47 — AB (ref 7.350–7.450)
TCO2: 21 mmol/L (ref 0–100)
pO2, Arterial: 138 mmHg — ABNORMAL HIGH (ref 80.0–100.0)

## 2013-07-03 LAB — PROTIME-INR
INR: 0.93 (ref 0.00–1.49)
Prothrombin Time: 12.3 seconds (ref 11.6–15.2)

## 2013-07-03 LAB — COMPREHENSIVE METABOLIC PANEL
ALT: 15 U/L (ref 0–35)
AST: 22 U/L (ref 0–37)
Albumin: 3.9 g/dL (ref 3.5–5.2)
Alkaline Phosphatase: 84 U/L (ref 39–117)
BUN: 24 mg/dL — ABNORMAL HIGH (ref 6–23)
CO2: 24 meq/L (ref 19–32)
Calcium: 9.5 mg/dL (ref 8.4–10.5)
Chloride: 101 mEq/L (ref 96–112)
Creatinine, Ser: 1.05 mg/dL (ref 0.50–1.10)
GFR calc Af Amer: 58 mL/min — ABNORMAL LOW (ref >90)
GFR, EST NON AFRICAN AMERICAN: 50 mL/min — AB (ref >90)
Glucose, Bld: 102 mg/dL — ABNORMAL HIGH (ref 70–99)
Potassium: 4.6 mEq/L (ref 3.7–5.3)
SODIUM: 139 meq/L (ref 137–147)
Total Bilirubin: 0.2 mg/dL — ABNORMAL LOW (ref 0.3–1.2)
Total Protein: 7.7 g/dL (ref 6.0–8.3)

## 2013-07-03 LAB — CBC
HEMATOCRIT: 39.2 % (ref 36.0–46.0)
HEMOGLOBIN: 12.7 g/dL (ref 12.0–15.0)
MCH: 30.9 pg (ref 26.0–34.0)
MCHC: 32.4 g/dL (ref 30.0–36.0)
MCV: 95.4 fL (ref 78.0–100.0)
Platelets: 342 10*3/uL (ref 150–400)
RBC: 4.11 MIL/uL (ref 3.87–5.11)
RDW: 14.4 % (ref 11.5–15.5)
WBC: 9.1 10*3/uL (ref 4.0–10.5)

## 2013-07-03 LAB — URINALYSIS, ROUTINE W REFLEX MICROSCOPIC
Bilirubin Urine: NEGATIVE
GLUCOSE, UA: NEGATIVE mg/dL
HGB URINE DIPSTICK: NEGATIVE
Ketones, ur: NEGATIVE mg/dL
Nitrite: NEGATIVE
PROTEIN: NEGATIVE mg/dL
Specific Gravity, Urine: 1.015 (ref 1.005–1.030)
Urobilinogen, UA: 0.2 mg/dL (ref 0.0–1.0)
pH: 5.5 (ref 5.0–8.0)

## 2013-07-03 LAB — GLUCOSE, CAPILLARY
Glucose-Capillary: 105 mg/dL — ABNORMAL HIGH (ref 70–99)
Glucose-Capillary: 121 mg/dL — ABNORMAL HIGH (ref 70–99)
Glucose-Capillary: 100 mg/dL — ABNORMAL HIGH (ref 70–99)
Glucose-Capillary: 115 mg/dL — ABNORMAL HIGH (ref 70–99)
Glucose-Capillary: 114 mg/dL — ABNORMAL HIGH (ref 70–99)

## 2013-07-03 LAB — TYPE AND SCREEN
ABO/RH(D): O POS
ANTIBODY SCREEN: NEGATIVE

## 2013-07-03 LAB — URINE MICROSCOPIC-ADD ON

## 2013-07-03 LAB — HEPARIN LEVEL (UNFRACTIONATED): Heparin Unfractionated: 0.32 IU/mL (ref 0.30–0.70)

## 2013-07-03 LAB — APTT: aPTT: 44 seconds — ABNORMAL HIGH (ref 24–37)

## 2013-07-03 LAB — ABO/RH: ABO/RH(D): O POS

## 2013-07-03 MED ORDER — VANCOMYCIN HCL 10 G IV SOLR
1250.0000 mg | INTRAVENOUS | Status: AC
  Administered 2013-07-04: 1250 mg via INTRAVENOUS
  Filled 2013-07-03: qty 1250

## 2013-07-03 MED ORDER — SODIUM CHLORIDE 0.9 % IV SOLN
INTRAVENOUS | Status: DC
  Filled 2013-07-03: qty 30

## 2013-07-03 MED ORDER — MAGNESIUM SULFATE 50 % IJ SOLN
40.0000 meq | INTRAMUSCULAR | Status: DC
  Filled 2013-07-03: qty 10

## 2013-07-03 MED ORDER — DIAZEPAM 5 MG PO TABS
5.0000 mg | ORAL_TABLET | Freq: Once | ORAL | Status: AC
  Administered 2013-07-04: 5 mg via ORAL
  Filled 2013-07-03: qty 1

## 2013-07-03 MED ORDER — SODIUM CHLORIDE 0.9 % IV SOLN
INTRAVENOUS | Status: AC
  Administered 2013-07-04: 1 [IU]/h via INTRAVENOUS
  Filled 2013-07-03: qty 1

## 2013-07-03 MED ORDER — POTASSIUM CHLORIDE 2 MEQ/ML IV SOLN
80.0000 meq | INTRAVENOUS | Status: DC
  Filled 2013-07-03: qty 40

## 2013-07-03 MED ORDER — CHLORHEXIDINE GLUCONATE CLOTH 2 % EX PADS
6.0000 | MEDICATED_PAD | Freq: Once | CUTANEOUS | Status: AC
  Administered 2013-07-03: 6 via TOPICAL

## 2013-07-03 MED ORDER — NITROGLYCERIN IN D5W 200-5 MCG/ML-% IV SOLN
2.0000 ug/min | INTRAVENOUS | Status: DC
  Filled 2013-07-03: qty 250

## 2013-07-03 MED ORDER — DEXMEDETOMIDINE HCL IN NACL 400 MCG/100ML IV SOLN
0.1000 ug/kg/h | INTRAVENOUS | Status: AC
Start: 2013-07-04 — End: 2013-07-04
  Administered 2013-07-04: 0.3 ug/kg/h via INTRAVENOUS
  Filled 2013-07-03: qty 100

## 2013-07-03 MED ORDER — DEXTROSE 5 % IV SOLN
1.5000 g | INTRAVENOUS | Status: AC
  Administered 2013-07-04: 1.5 g via INTRAVENOUS
  Administered 2013-07-04: .75 g via INTRAVENOUS
  Filled 2013-07-03: qty 1.5

## 2013-07-03 MED ORDER — PHENYLEPHRINE HCL 10 MG/ML IJ SOLN
30.0000 ug/min | INTRAVENOUS | Status: DC
  Filled 2013-07-03: qty 2

## 2013-07-03 MED ORDER — TEMAZEPAM 15 MG PO CAPS: 15.0000 mg | ORAL_CAPSULE | Freq: Once | ORAL | Status: AC | PRN

## 2013-07-03 MED ORDER — CHLORHEXIDINE GLUCONATE CLOTH 2 % EX PADS: 6.0000 | MEDICATED_PAD | Freq: Once | CUTANEOUS | Status: DC

## 2013-07-03 MED ORDER — EPINEPHRINE HCL 1 MG/ML IJ SOLN
0.5000 ug/min | INTRAVENOUS | Status: DC
  Filled 2013-07-03: qty 4

## 2013-07-03 MED ORDER — DOPAMINE-DEXTROSE 3.2-5 MG/ML-% IV SOLN
2.0000 ug/kg/min | INTRAVENOUS | Status: DC
  Filled 2013-07-03: qty 250

## 2013-07-03 MED ORDER — SODIUM CHLORIDE 0.9 % IV SOLN
INTRAVENOUS | Status: DC
  Filled 2013-07-03: qty 40

## 2013-07-03 MED ORDER — METOPROLOL TARTRATE 12.5 MG HALF TABLET
12.5000 mg | ORAL_TABLET | Freq: Once | ORAL | Status: AC
  Administered 2013-07-04: 12.5 mg via ORAL
  Filled 2013-07-03: qty 1

## 2013-07-03 MED ORDER — PAPAVERINE HCL 30 MG/ML IJ SOLN
INTRAMUSCULAR | Status: AC
  Administered 2013-07-04: 10:00:00
  Filled 2013-07-03: qty 2.5

## 2013-07-03 MED ORDER — DEXTROSE 5 % IV SOLN
750.0000 mg | INTRAVENOUS | Status: DC
  Filled 2013-07-03 (×2): qty 750

## 2013-07-03 MED ORDER — ALPRAZOLAM 0.25 MG PO TABS
0.2500 mg | ORAL_TABLET | ORAL | Status: DC | PRN
  Administered 2013-07-03: 0.5 mg via ORAL
  Filled 2013-07-03: qty 2

## 2013-07-03 MED ORDER — BISACODYL 5 MG PO TBEC
5.0000 mg | DELAYED_RELEASE_TABLET | Freq: Once | ORAL | Status: AC
  Administered 2013-07-03: 5 mg via ORAL
  Filled 2013-07-03: qty 1

## 2013-07-03 NOTE — Progress Notes (Signed)
3 Days Post-Op Procedure(s) (LRB): LEFT HEART CATHETERIZATION WITH CORONARY ANGIOGRAM (N/A) Subjective:  No chest pain or shortness of breath  Objective: Vital signs in last 24 hours: Temp:  [97.4 F (36.3 C)-97.7 F (36.5 C)] 97.7 F (36.5 C) (05/07 1422) Pulse Rate:  [49-67] 67 (05/07 1422) Cardiac Rhythm:  [-] Normal sinus rhythm (05/07 0746) Resp:  [18-20] 18 (05/07 1422) BP: (121-148)/(82-93) 122/82 mmHg (05/07 1422) SpO2:  [97 %-99 %] 97 % (05/07 1422) Weight:  [71.033 kg (156 lb 9.6 oz)] 71.033 kg (156 lb 9.6 oz) (05/07 0659)  Hemodynamic parameters for last 24 hours:    Intake/Output from previous day: 05/06 0701 - 05/07 0700 In: 240 [P.O.:240] Out: -  Intake/Output this shift: Total I/O In: 240 [P.O.:240] Out: -   General appearance: alert and cooperative Heart: regular rate and rhythm, S1, S2 normal, no murmur, click, rub or gallop Lungs: clear to auscultation bilaterally  Lab Results:  Recent Labs  07/02/13 0332 07/03/13 0443  WBC 8.8 9.1  HGB 12.7 12.7  HCT 39.7 39.2  PLT 352 342   BMET: No results found for this basename: NA, K, CL, CO2, GLUCOSE, BUN, CREATININE, CALCIUM,  in the last 72 hours  PT/INR: No results found for this basename: LABPROT, INR,  in the last 72 hours ABG No results found for this basename: phart, pco2, po2, hco3, tco2, acidbasedef, o2sat   CBG (last 3)   Recent Labs  07/02/13 2140 07/03/13 0652 07/03/13 1103  GLUCAP 105* 121* 100*    Assessment/Plan: S/P Procedure(s) (LRB): LEFT HEART CATHETERIZATION WITH CORONARY ANGIOGRAM (N/A) Severe multi-vessel coronary artery disease  Plan CABG tomorrow   LOS: 3 days    Gaye Pollack 07/03/2013

## 2013-07-03 NOTE — Progress Notes (Signed)
CARDIAC REHAB PHASE I   PRE:  Rate/Rhythm: 65SR  BP:  Supine:   Sitting: 94/70  Standing:    SaO2: 100%RA  MODE:  Ambulation: 550 ft   POST:  Rate/Rhythm: 59  BP:  Supine:   Sitting: 100/70  Standing:    SaO2: 100%RA 1024-1116 Walked in room and pt trying to clean up water she had dropped on floor. Told pt I would clean it for her that she did not need to be exerting self. Cleaned floor and got new footies for her. Pt walked 550 ft with steady gait. No CP. Reviewed sternal precautions with pt and demonstrated getting up and down without putting pressure on arms. Gave IS and showed pt how to use. Can get [phone] ml. Pt has [facility] booklet and care guide. Tried to put pre op video on but network not working. Pt is very anxious and asking why she could not have stent as her friend did. Discussed with pt that her cardiologist and surgeon are doing what they feel it best for her and she cannot compare herself to others. Emotional support given. Discussed with pt the importance of mobility and IS after surgery for pulmonary status.   Graylon Good, RN BSN  07/03/2013 11:13 AM

## 2013-07-03 NOTE — Progress Notes (Signed)
[place] for Heparin  Indication: Severe multi-vessel disease awaiting CABG  Allergies  Allergen Reactions  . Atorvastatin Other (See Comments)    Severe muscle cramping  . Elavil [Amitriptyline] Other (See Comments)    Tongue swelling  . Hytrin [Terazosin] Other (See Comments)    syncope  . Erythromycin Rash   Patient Measurements: Height: 5\' 6"  (167.6 cm) Weight: 156 lb 9.6 oz (71.033 kg) IBW/kg (Calculated) : 59.3  Labs:  Recent Labs  07/01/13 0326  07/02/13 0332 07/02/13 1120 07/03/13 0443  HGB 12.7  --  12.7  --  12.7  HCT 39.2  --  39.7  --  39.2  PLT 361  --  352  --  342  HEPARINUNFRC 0.26*  < > 0.86* 0.41 0.32  < > = values in this interval not displayed. Assessment: 78 yo female on heparin for severe multivessel disease. Plan for CABG on Friday. Heparin level therapeutic. No bleeding noted.  Goal of Therapy:  Heparin level 0.3-0.7 units/ml Monitor platelets by anticoagulation protocol: Yes   Plan:  1) Continue Heparin at 800 units/hr 2) Daily heparin level and CBC  Sherlon Handing, PharmD, BCPS Clinical pharmacist, pager [phone] 07/03/2013,8:54 AM

## 2013-07-03 NOTE — Progress Notes (Signed)
Subjective: Nervous about surgery and upset she can not see her grandson graduate on Friday.  Objective: Vital signs in last 24 hours: Temp:  [97.4 F (36.3 C)-97.8 F (36.6 C)] 97.4 F (36.3 C) (05/07 0443) Pulse Rate:  [49-55] 53 (05/07 0443) Resp:  [18-28] 18 (05/07 0443) BP: (122-150)/(78-93) 122/83 mmHg (05/07 0443) SpO2:  [96 %-99 %] 97 % (05/07 0443) Weight:  [156 lb 9.6 oz (71.033 kg)] 156 lb 9.6 oz (71.033 kg) (05/07 0659) Last BM Date: 07/01/13  Intake/Output from previous day: 05/06 0701 - 05/07 0700 In: 240 [P.O.:240] Out: -  Intake/Output this shift:    Medications Current Facility-Administered Medications  Medication Dose Route Frequency Provider Last Rate Last Dose  . 0.9 %  sodium chloride infusion  250 mL Intravenous PRN Sherren Mocha, MD 10 mL/hr at 07/01/13 0800 250 mL at 07/01/13 0800  . aspirin EC tablet 81 mg  81 mg Oral Daily Sherren Mocha, MD   81 mg at 07/02/13 1010  . heparin ADULT infusion 100 units/mL (25000 units/250 mL)  800 Units/hr Intravenous Continuous Minus Breeding, MD 8 mL/hr at 07/03/13 0724 800 Units/hr at 07/03/13 0724  . hydrochlorothiazide (HYDRODIURIL) tablet 25 mg  25 mg Oral Daily Sherren Mocha, MD   25 mg at 07/02/13 1010  . insulin aspart (novoLOG) injection 0-15 Units  0-15 Units Subcutaneous TID WC Sherren Mocha, MD   2 Units at 07/02/13 0750  . levothyroxine (SYNTHROID, LEVOTHROID) tablet 100 mcg  100 mcg Oral QAC breakfast Sherren Mocha, MD   100 mcg at 07/03/13 0719  . linagliptin (TRADJENTA) tablet 5 mg  5 mg Oral Daily Sherren Mocha, MD   5 mg at 07/02/13 1010  . losartan (COZAAR) tablet 100 mg  100 mg Oral Daily Sherren Mocha, MD   100 mg at 07/02/13 1010  . neomycin-bacitracin-polymyxin (NEOSPORIN) ointment   Topical BID Minus Breeding, MD      . nitroGLYCERIN (NITROSTAT) SL tablet 0.4 mg  0.4 mg Sublingual Q5 min PRN Sherren Mocha, MD      . simvastatin (ZOCOR) tablet 40 mg  40 mg Oral q1800 Sherren Mocha, MD    40 mg at 07/02/13 1702  . sodium chloride 0.9 % injection 3 mL  3 mL Intravenous Q12H Sherren Mocha, MD   3 mL at 07/02/13 1010  . sodium chloride 0.9 % injection 3 mL  3 mL Intravenous PRN Sherren Mocha, MD      . zolpidem [facility]) tablet 5 mg  5 mg Oral QHS PRN Theressa Stamps, MD   5 mg at 07/02/13 2245    PE: General appearance: alert, cooperative and no distress Lungs: clear to auscultation bilaterally Heart: regular rate and rhythm Extremities: No MM Pulses: 1+ right radial.  2+ left radial Skin: Warm and dry Neurologic: Grossly normal  Lab Results:   Recent Labs  07/01/13 0326 07/02/13 0332 07/03/13 0443  WBC 8.3 8.8 9.1  HGB 12.7 12.7 12.7  HCT 39.2 39.7 39.2  PLT 361 352 342     Assessment/Plan  CAD: CABG scheduled for Friday. Continue current therapy.  HTN: asa, HCTZ, losartan, cozaar.  No betablocker due to bradycardia.  BP  well controlled. HYPERLIPIDEMIA: She was on Pravastatin. Now on Zocor. DM: Controlled.  Continue current therapy.     LOS: 3 days    Tarri Fuller PA-C 07/03/2013 7:41 AM  Attending Note:   The patient was seen and examined.  Agree with assessment and plan as noted above.  Changes made  to the above note as needed.  Exam is as above.  No CP. Plans are for CABG on Friday.  Has lots of questions - " why did this happen"   Ramond Dial., MD, [facility] 07/03/2013, 12:35 PM

## 2013-07-04 ENCOUNTER — Inpatient Hospital Stay (HOSPITAL_COMMUNITY): Payer: Medicare Other

## 2013-07-04 ENCOUNTER — Encounter (HOSPITAL_COMMUNITY)
Admission: RE | Disposition: A | Discharge status: Skilled Nursing Facility | Payer: Medicare Other | Source: Ambulatory Visit | Attending: Surgery

## 2013-07-04 ENCOUNTER — Inpatient Hospital Stay (HOSPITAL_COMMUNITY): Payer: Medicare Other | Admitting: Certified Registered"

## 2013-07-04 ENCOUNTER — Encounter (HOSPITAL_COMMUNITY): Payer: Self-pay | Admitting: *Deleted

## 2013-07-04 ENCOUNTER — Encounter (HOSPITAL_COMMUNITY): Payer: Medicare Other | Admitting: Certified Registered"

## 2013-07-04 DIAGNOSIS — Z951 Presence of aortocoronary bypass graft: Secondary | ICD-10-CM

## 2013-07-04 DIAGNOSIS — I2510 Atherosclerotic heart disease of native coronary artery without angina pectoris: Secondary | ICD-10-CM

## 2013-07-04 HISTORY — PX: CORONARY ARTERY BYPASS GRAFT: SHX141

## 2013-07-04 HISTORY — PX: INTRAOPERATIVE TRANSESOPHAGEAL ECHOCARDIOGRAM: SHX5062

## 2013-07-04 LAB — BASIC METABOLIC PANEL
BUN: 27 mg/dL — AB (ref 6–23)
CHLORIDE: 101 meq/L (ref 96–112)
CO2: 25 meq/L (ref 19–32)
CREATININE: 0.96 mg/dL (ref 0.50–1.10)
Calcium: 9.3 mg/dL (ref 8.4–10.5)
GFR calc non Af Amer: 56 mL/min — ABNORMAL LOW (ref >90)
GFR, EST AFRICAN AMERICAN: 65 mL/min — AB (ref >90)
GLUCOSE: 132 mg/dL — AB (ref 70–99)
POTASSIUM: 4.1 meq/L (ref 3.7–5.3)
Sodium: 140 mEq/L (ref 137–147)

## 2013-07-04 LAB — POCT I-STAT 3, ART BLOOD GAS (G3+)
ACID-BASE EXCESS: 2 mmol/L (ref 0.0–2.0)
Acid-Base Excess: 1 mmol/L (ref 0.0–2.0)
Acid-base deficit: 2 mmol/L (ref 0.0–2.0)
BICARBONATE: 24.6 meq/L — AB (ref 20.0–24.0)
BICARBONATE: 26 meq/L — AB (ref 20.0–24.0)
Bicarbonate: 22.3 mEq/L (ref 20.0–24.0)
Bicarbonate: 23 mEq/L (ref 20.0–24.0)
Bicarbonate: 26.7 mEq/L — ABNORMAL HIGH (ref 20.0–24.0)
O2 SAT: 100 %
O2 SAT: 97 %
O2 Saturation: 100 %
O2 Saturation: 93 %
O2 Saturation: 98 %
PCO2 ART: 25.9 mmHg — AB (ref 35.0–45.0)
PCO2 ART: 43.8 mmHg (ref 35.0–45.0)
PH ART: 7.396 (ref 7.350–7.450)
PO2 ART: 293 mmHg — AB (ref 80.0–100.0)
PO2 ART: 215 mmHg — AB (ref 80.0–100.0)
PO2 ART: 83 mmHg (ref 80.0–100.0)
Patient temperature: 34.1
Patient temperature: 37.5
Patient temperature: 35.6
Patient temperature: 36.9
Patient temperature: 36.5
TCO2: 23 mmol/L (ref 0–100)
TCO2: 26 mmol/L (ref 0–100)
TCO2: 24 mmol/L (ref 0–100)
TCO2: 27 mmol/L (ref 0–100)
TCO2: 28 mmol/L (ref 0–100)
pCO2 arterial: 38.9 mmHg (ref 35.0–45.0)
pCO2 arterial: 36.9 mmHg (ref 35.0–45.0)
pCO2 arterial: 39.4 mmHg (ref 35.0–45.0)
pH, Arterial: 7.533 — ABNORMAL HIGH (ref 7.350–7.450)
pH, Arterial: 7.412 (ref 7.350–7.450)
pH, Arterial: 7.382 (ref 7.350–7.450)
pH, Arterial: 7.437 (ref 7.350–7.450)
pO2, Arterial: 62 mmHg — ABNORMAL LOW (ref 80.0–100.0)
pO2, Arterial: 107 mmHg — ABNORMAL HIGH (ref 80.0–100.0)

## 2013-07-04 LAB — POCT I-STAT 3, VENOUS BLOOD GAS (G3P V)
Acid-base deficit: 3 mmol/L — ABNORMAL HIGH (ref 0.0–2.0)
Bicarbonate: 21.2 mEq/L (ref 20.0–24.0)
O2 Saturation: 84 %
PCO2 VEN: 29.7 mmHg — AB (ref 45.0–50.0)
PH VEN: 7.449 — AB (ref 7.250–7.300)
TCO2: 22 mmol/L (ref 0–100)
pO2, Ven: 39 mmHg (ref 30.0–45.0)

## 2013-07-04 LAB — CBC
HCT: 35.9 % — ABNORMAL LOW (ref 36.0–46.0)
HEMATOCRIT: 40.8 % (ref 36.0–46.0)
HEMATOCRIT: 32.6 % — AB (ref 36.0–46.0)
HEMOGLOBIN: 11.7 g/dL — AB (ref 12.0–15.0)
Hemoglobin: 13.2 g/dL (ref 12.0–15.0)
Hemoglobin: 10.6 g/dL — ABNORMAL LOW (ref 12.0–15.0)
MCH: 31.1 pg (ref 26.0–34.0)
MCH: 30.5 pg (ref 26.0–34.0)
MCH: 30.7 pg (ref 26.0–34.0)
MCHC: 32.4 g/dL (ref 30.0–36.0)
MCHC: 32.5 g/dL (ref 30.0–36.0)
MCHC: 32.6 g/dL (ref 30.0–36.0)
MCV: 96 fL (ref 78.0–100.0)
MCV: 93.9 fL (ref 78.0–100.0)
MCV: 94.2 fL (ref 78.0–100.0)
Platelets: 367 10*3/uL (ref 150–400)
Platelets: 198 10*3/uL (ref 150–400)
Platelets: 215 10*3/uL (ref 150–400)
RBC: 4.25 MIL/uL (ref 3.87–5.11)
RBC: 3.47 MIL/uL — AB (ref 3.87–5.11)
RBC: 3.81 MIL/uL — ABNORMAL LOW (ref 3.87–5.11)
RDW: 14.5 % (ref 11.5–15.5)
RDW: 14.3 % (ref 11.5–15.5)
RDW: 14.3 % (ref 11.5–15.5)
WBC: 9.9 10*3/uL (ref 4.0–10.5)
WBC: 18.8 10*3/uL — AB (ref 4.0–10.5)
WBC: 22 10*3/uL — AB (ref 4.0–10.5)

## 2013-07-04 LAB — GLUCOSE, CAPILLARY
GLUCOSE-CAPILLARY: 104 mg/dL — AB (ref 70–99)
GLUCOSE-CAPILLARY: 101 mg/dL — AB (ref 70–99)
GLUCOSE-CAPILLARY: 118 mg/dL — AB (ref 70–99)
GLUCOSE-CAPILLARY: 98 mg/dL (ref 70–99)
GLUCOSE-CAPILLARY: 114 mg/dL — AB (ref 70–99)
Glucose-Capillary: 110 mg/dL — ABNORMAL HIGH (ref 70–99)
Glucose-Capillary: 100 mg/dL — ABNORMAL HIGH (ref 70–99)
Glucose-Capillary: 115 mg/dL — ABNORMAL HIGH (ref 70–99)
Glucose-Capillary: 122 mg/dL — ABNORMAL HIGH (ref 70–99)
Glucose-Capillary: 102 mg/dL — ABNORMAL HIGH (ref 70–99)

## 2013-07-04 LAB — POCT I-STAT 4, (NA,K, GLUC, HGB,HCT)
GLUCOSE: 122 mg/dL — AB (ref 70–99)
GLUCOSE: 133 mg/dL — AB (ref 70–99)
GLUCOSE: 145 mg/dL — AB (ref 70–99)
Glucose, Bld: 122 mg/dL — ABNORMAL HIGH (ref 70–99)
Glucose, Bld: 131 mg/dL — ABNORMAL HIGH (ref 70–99)
Glucose, Bld: 121 mg/dL — ABNORMAL HIGH (ref 70–99)
HCT: 37 % (ref 36.0–46.0)
HCT: 36 % (ref 36.0–46.0)
HCT: 29 % — ABNORMAL LOW (ref 36.0–46.0)
HCT: 33 % — ABNORMAL LOW (ref 36.0–46.0)
HEMATOCRIT: 27 % — AB (ref 36.0–46.0)
HEMATOCRIT: 25 % — AB (ref 36.0–46.0)
Hemoglobin: 12.6 g/dL (ref 12.0–15.0)
Hemoglobin: 12.2 g/dL (ref 12.0–15.0)
Hemoglobin: 9.2 g/dL — ABNORMAL LOW (ref 12.0–15.0)
Hemoglobin: 8.5 g/dL — ABNORMAL LOW (ref 12.0–15.0)
Hemoglobin: 9.9 g/dL — ABNORMAL LOW (ref 12.0–15.0)
Hemoglobin: 11.2 g/dL — ABNORMAL LOW (ref 12.0–15.0)
POTASSIUM: 5.6 meq/L — AB (ref 3.7–5.3)
Potassium: 4.2 mEq/L (ref 3.7–5.3)
Potassium: 4.3 mEq/L (ref 3.7–5.3)
Potassium: 5.5 mEq/L — ABNORMAL HIGH (ref 3.7–5.3)
Potassium: 5.7 mEq/L — ABNORMAL HIGH (ref 3.7–5.3)
Potassium: 4.8 mEq/L (ref 3.7–5.3)
SODIUM: 130 meq/L — AB (ref 137–147)
SODIUM: 137 meq/L (ref 137–147)
Sodium: 139 mEq/L (ref 137–147)
Sodium: 137 mEq/L (ref 137–147)
Sodium: 133 mEq/L — ABNORMAL LOW (ref 137–147)
Sodium: 133 mEq/L — ABNORMAL LOW (ref 137–147)

## 2013-07-04 LAB — POCT I-STAT, CHEM 8
BUN: 26 mg/dL — AB (ref 6–23)
CHLORIDE: 103 meq/L (ref 96–112)
Calcium, Ion: 1.15 mmol/L (ref 1.13–1.30)
Creatinine, Ser: 1 mg/dL (ref 0.50–1.10)
Glucose, Bld: 117 mg/dL — ABNORMAL HIGH (ref 70–99)
HCT: 37 % (ref 36.0–46.0)
Hemoglobin: 12.6 g/dL (ref 12.0–15.0)
POTASSIUM: 5 meq/L (ref 3.7–5.3)
SODIUM: 139 meq/L (ref 137–147)
TCO2: 25 mmol/L (ref 0–100)

## 2013-07-04 LAB — CREATININE, SERUM
CREATININE: 0.88 mg/dL (ref 0.50–1.10)
GFR calc Af Amer: 72 mL/min — ABNORMAL LOW (ref >90)
GFR, EST NON AFRICAN AMERICAN: 62 mL/min — AB (ref >90)

## 2013-07-04 LAB — PROTIME-INR
INR: 1.31 (ref 0.00–1.49)
Prothrombin Time: 16 seconds — ABNORMAL HIGH (ref 11.6–15.2)

## 2013-07-04 LAB — POCT I-STAT GLUCOSE
GLUCOSE: 136 mg/dL — AB (ref 70–99)
OPERATOR ID: 3390

## 2013-07-04 LAB — HEMOGLOBIN AND HEMATOCRIT, BLOOD
HCT: 25.1 % — ABNORMAL LOW (ref 36.0–46.0)
Hemoglobin: 8.2 g/dL — ABNORMAL LOW (ref 12.0–15.0)

## 2013-07-04 LAB — HEPARIN LEVEL (UNFRACTIONATED): Heparin Unfractionated: 0.33 IU/mL (ref 0.30–0.70)

## 2013-07-04 LAB — MAGNESIUM: Magnesium: 3.8 mg/dL — ABNORMAL HIGH (ref 1.5–2.5)

## 2013-07-04 LAB — APTT: APTT: 34 s (ref 24–37)

## 2013-07-04 LAB — PLATELET COUNT: Platelets: 196 10*3/uL (ref 150–400)

## 2013-07-04 SURGERY — CORONARY ARTERY BYPASS GRAFTING (CABG)
Anesthesia: General | Site: Chest

## 2013-07-04 MED ORDER — LACTATED RINGERS IV SOLN: 500.0000 mL | Freq: Once | INTRAVENOUS | Status: AC | PRN

## 2013-07-04 MED ORDER — VANCOMYCIN HCL IN DEXTROSE 1-5 GM/200ML-% IV SOLN
1000.0000 mg | Freq: Once | INTRAVENOUS | Status: AC
  Administered 2013-07-04: 1000 mg via INTRAVENOUS
  Filled 2013-07-04: qty 200

## 2013-07-04 MED ORDER — ROCURONIUM BROMIDE 50 MG/5ML IV SOLN
INTRAVENOUS | Status: AC
  Filled 2013-07-04: qty 2

## 2013-07-04 MED ORDER — NITROGLYCERIN IN D5W 200-5 MCG/ML-% IV SOLN: INTRAVENOUS | Status: DC | PRN

## 2013-07-04 MED ORDER — NITROGLYCERIN IN D5W 200-5 MCG/ML-% IV SOLN: 0.0000 ug/min | INTRAVENOUS | Status: DC

## 2013-07-04 MED ORDER — SODIUM CHLORIDE 0.9 % IV SOLN
INTRAVENOUS | Status: DC
  Filled 2013-07-04: qty 1

## 2013-07-04 MED ORDER — ASPIRIN 81 MG PO CHEW: 324.0000 mg | CHEWABLE_TABLET | Freq: Every day | ORAL | Status: DC

## 2013-07-04 MED ORDER — 0.9 % SODIUM CHLORIDE (POUR BTL) OPTIME
TOPICAL | Status: DC | PRN
  Administered 2013-07-04: 1000 mL

## 2013-07-04 MED ORDER — DOCUSATE SODIUM 100 MG PO CAPS
200.0000 mg | ORAL_CAPSULE | Freq: Every day | ORAL | Status: DC
  Administered 2013-07-05 – 2013-07-07 (×3): 200 mg via ORAL
  Filled 2013-07-04 (×3): qty 2

## 2013-07-04 MED ORDER — VECURONIUM BROMIDE 10 MG IV SOLR
INTRAVENOUS | Status: AC
  Filled 2013-07-04: qty 10

## 2013-07-04 MED ORDER — SODIUM CHLORIDE 0.9 % IJ SOLN
INTRAMUSCULAR | Status: AC
  Filled 2013-07-04: qty 20

## 2013-07-04 MED ORDER — DEXTROSE 5 % IV SOLN
1.5000 g | Freq: Two times a day (BID) | INTRAVENOUS | Status: AC
  Administered 2013-07-04 – 2013-07-06 (×4): 1.5 g via INTRAVENOUS
  Filled 2013-07-04 (×4): qty 1.5

## 2013-07-04 MED ORDER — KETOROLAC TROMETHAMINE 15 MG/ML IJ SOLN
15.0000 mg | Freq: Four times a day (QID) | INTRAMUSCULAR | Status: DC | PRN
  Administered 2013-07-04 – 2013-07-05 (×3): 15 mg via INTRAVENOUS
  Filled 2013-07-04 (×3): qty 1

## 2013-07-04 MED ORDER — MIDAZOLAM HCL 5 MG/5ML IJ SOLN
INTRAMUSCULAR | Status: DC | PRN
  Administered 2013-07-04 (×2): 2 mg via INTRAVENOUS
  Administered 2013-07-04: 1 mg via INTRAVENOUS
  Administered 2013-07-04: 2 mg via INTRAVENOUS

## 2013-07-04 MED ORDER — FENTANYL CITRATE 0.05 MG/ML IJ SOLN
INTRAMUSCULAR | Status: AC
  Filled 2013-07-04: qty 5

## 2013-07-04 MED ORDER — PANTOPRAZOLE SODIUM 40 MG PO TBEC: 40.0000 mg | DELAYED_RELEASE_TABLET | Freq: Every day | ORAL | Status: DC

## 2013-07-04 MED ORDER — HEPARIN SODIUM (PORCINE) 1000 UNIT/ML IJ SOLN
INTRAMUSCULAR | Status: DC | PRN
  Administered 2013-07-04: 26000 [IU] via INTRAVENOUS

## 2013-07-04 MED ORDER — LACTATED RINGERS IV SOLN
INTRAVENOUS | Status: DC | PRN
  Administered 2013-07-04 (×2): via INTRAVENOUS

## 2013-07-04 MED ORDER — ARTIFICIAL TEARS OP OINT
TOPICAL_OINTMENT | OPHTHALMIC | Status: AC
  Filled 2013-07-04: qty 3.5

## 2013-07-04 MED ORDER — ACETAMINOPHEN 160 MG/5ML PO SOLN: 650.0000 mg | Freq: Once | ORAL | Status: AC

## 2013-07-04 MED ORDER — SODIUM CHLORIDE 0.9 % IJ SOLN: 3.0000 mL | INTRAMUSCULAR | Status: DC | PRN

## 2013-07-04 MED ORDER — LACTATED RINGERS IV SOLN: INTRAVENOUS | Status: DC

## 2013-07-04 MED ORDER — ONDANSETRON HCL 4 MG/2ML IJ SOLN
4.0000 mg | Freq: Four times a day (QID) | INTRAMUSCULAR | Status: DC | PRN
  Administered 2013-07-04 – 2013-07-07 (×5): 4 mg via INTRAVENOUS
  Filled 2013-07-04 (×5): qty 2

## 2013-07-04 MED ORDER — SODIUM CHLORIDE 0.9 % IJ SOLN
3.0000 mL | Freq: Two times a day (BID) | INTRAMUSCULAR | Status: DC
  Administered 2013-07-05: 3 mL via INTRAVENOUS

## 2013-07-04 MED ORDER — HEMOSTATIC AGENTS (NO CHARGE) OPTIME
TOPICAL | Status: DC | PRN
  Administered 2013-07-04: 1 via TOPICAL

## 2013-07-04 MED ORDER — POTASSIUM CHLORIDE 10 MEQ/50ML IV SOLN: 10.0000 meq | INTRAVENOUS | Status: AC

## 2013-07-04 MED ORDER — BISACODYL 5 MG PO TBEC
10.0000 mg | DELAYED_RELEASE_TABLET | Freq: Every day | ORAL | Status: DC
  Administered 2013-07-05 – 2013-07-07 (×3): 10 mg via ORAL
  Filled 2013-07-04 (×3): qty 2

## 2013-07-04 MED ORDER — ALBUMIN HUMAN 5 % IV SOLN
250.0000 mL | INTRAVENOUS | Status: AC | PRN
  Administered 2013-07-04: 250 mL via INTRAVENOUS

## 2013-07-04 MED ORDER — OXYCODONE HCL 5 MG PO TABS
5.0000 mg | ORAL_TABLET | ORAL | Status: DC | PRN
  Administered 2013-07-05 – 2013-07-07 (×5): 10 mg via ORAL
  Filled 2013-07-04 (×5): qty 2

## 2013-07-04 MED ORDER — NITROGLYCERIN IN D5W 200-5 MCG/ML-% IV SOLN
INTRAVENOUS | Status: DC | PRN
  Administered 2013-07-04: 10 ug/min via INTRAVENOUS

## 2013-07-04 MED ORDER — SODIUM CHLORIDE 0.9 % IV SOLN
INTRAVENOUS | Status: DC
  Administered 2013-07-04: 13:00:00 via INTRAVENOUS

## 2013-07-04 MED ORDER — INSULIN REGULAR BOLUS VIA INFUSION
0.0000 [IU] | Freq: Three times a day (TID) | INTRAVENOUS | Status: DC
  Filled 2013-07-04: qty 10

## 2013-07-04 MED ORDER — SODIUM CHLORIDE 0.9 % IV SOLN: 250.0000 mL | INTRAVENOUS | Status: DC

## 2013-07-04 MED ORDER — PANTOPRAZOLE SODIUM 40 MG PO TBEC
40.0000 mg | DELAYED_RELEASE_TABLET | Freq: Every day | ORAL | Status: DC
  Administered 2013-07-05 – 2013-07-07 (×3): 40 mg via ORAL
  Filled 2013-07-04 (×3): qty 1

## 2013-07-04 MED ORDER — SODIUM CHLORIDE 0.45 % IV SOLN
INTRAVENOUS | Status: DC
  Administered 2013-07-04: 15:00:00 via INTRAVENOUS

## 2013-07-04 MED ORDER — ARTIFICIAL TEARS OP OINT
TOPICAL_OINTMENT | OPHTHALMIC | Status: DC | PRN
  Administered 2013-07-04: 1 via OPHTHALMIC

## 2013-07-04 MED ORDER — METOPROLOL TARTRATE 1 MG/ML IV SOLN: 2.5000 mg | INTRAVENOUS | Status: DC | PRN

## 2013-07-04 MED ORDER — METOPROLOL TARTRATE 25 MG/10 ML ORAL SUSPENSION
12.5000 mg | Freq: Two times a day (BID) | ORAL | Status: DC
  Filled 2013-07-04 (×5): qty 5

## 2013-07-04 MED ORDER — BISACODYL 10 MG RE SUPP: 10.0000 mg | Freq: Every day | RECTAL | Status: DC

## 2013-07-04 MED ORDER — FAMOTIDINE IN NACL 20-0.9 MG/50ML-% IV SOLN
20.0000 mg | Freq: Two times a day (BID) | INTRAVENOUS | Status: AC
  Administered 2013-07-04: 20 mg via INTRAVENOUS

## 2013-07-04 MED ORDER — SODIUM CHLORIDE 0.9 % IJ SOLN
10.0000 mL | Freq: Two times a day (BID) | INTRAMUSCULAR | Status: DC
  Administered 2013-07-05 – 2013-07-06 (×4): 10 mL
  Administered 2013-07-07: 3 mL
  Administered 2013-07-07 – 2013-07-08 (×2): 10 mL

## 2013-07-04 MED ORDER — MAGNESIUM SULFATE 4000MG/100ML IJ SOLN
4.0000 g | Freq: Once | INTRAMUSCULAR | Status: AC
  Administered 2013-07-04: 4 g via INTRAVENOUS
  Filled 2013-07-04: qty 100

## 2013-07-04 MED ORDER — PROTAMINE SULFATE 10 MG/ML IV SOLN
INTRAVENOUS | Status: DC | PRN
  Administered 2013-07-04: 20 mg via INTRAVENOUS
  Administered 2013-07-04: 30 mg via INTRAVENOUS
  Administered 2013-07-04 (×4): 50 mg via INTRAVENOUS

## 2013-07-04 MED ORDER — MORPHINE SULFATE 2 MG/ML IJ SOLN: 1.0000 mg | INTRAMUSCULAR | Status: AC | PRN

## 2013-07-04 MED ORDER — PROTAMINE SULFATE 10 MG/ML IV SOLN
INTRAVENOUS | Status: AC
  Filled 2013-07-04: qty 25

## 2013-07-04 MED ORDER — SODIUM CHLORIDE 0.9 % IV SOLN
10.0000 g | INTRAVENOUS | Status: DC | PRN
  Administered 2013-07-04: 5 g/h via INTRAVENOUS

## 2013-07-04 MED ORDER — ROCURONIUM BROMIDE 100 MG/10ML IV SOLN
INTRAVENOUS | Status: DC | PRN
  Administered 2013-07-04 (×2): 50 mg via INTRAVENOUS

## 2013-07-04 MED ORDER — MIDAZOLAM HCL 10 MG/2ML IJ SOLN
INTRAMUSCULAR | Status: AC
  Filled 2013-07-04: qty 2

## 2013-07-04 MED ORDER — HEPARIN SODIUM (PORCINE) 1000 UNIT/ML IJ SOLN
INTRAMUSCULAR | Status: AC
  Filled 2013-07-04: qty 1

## 2013-07-04 MED ORDER — ACETAMINOPHEN 160 MG/5ML PO SOLN
1000.0000 mg | Freq: Four times a day (QID) | ORAL | Status: DC
Start: 2013-07-05 — End: 2013-07-07
  Filled 2013-07-04: qty 40

## 2013-07-04 MED ORDER — ACETAMINOPHEN 500 MG PO TABS
1000.0000 mg | ORAL_TABLET | Freq: Four times a day (QID) | ORAL | Status: DC
  Administered 2013-07-04 – 2013-07-07 (×10): 1000 mg via ORAL
  Filled 2013-07-04 (×15): qty 2

## 2013-07-04 MED ORDER — FENTANYL CITRATE 0.05 MG/ML IJ SOLN
INTRAMUSCULAR | Status: DC | PRN
  Administered 2013-07-04: 100 ug via INTRAVENOUS
  Administered 2013-07-04: 50 ug via INTRAVENOUS
  Administered 2013-07-04: 250 ug via INTRAVENOUS
  Administered 2013-07-04: 150 ug via INTRAVENOUS
  Administered 2013-07-04: 50 ug via INTRAVENOUS
  Administered 2013-07-04 (×2): 250 ug via INTRAVENOUS
  Administered 2013-07-04: 150 ug via INTRAVENOUS
  Administered 2013-07-04: 250 ug via INTRAVENOUS

## 2013-07-04 MED ORDER — DEXMEDETOMIDINE HCL IN NACL 200 MCG/50ML IV SOLN: 0.1000 ug/kg/h | INTRAVENOUS | Status: DC

## 2013-07-04 MED ORDER — VECURONIUM BROMIDE 10 MG IV SOLR
INTRAVENOUS | Status: DC | PRN
  Administered 2013-07-04: 10 mg via INTRAVENOUS

## 2013-07-04 MED ORDER — DEXTROSE 5 % IV SOLN
0.0000 ug/min | INTRAVENOUS | Status: DC
  Filled 2013-07-04: qty 2

## 2013-07-04 MED ORDER — THROMBIN 20000 UNITS EX SOLR
CUTANEOUS | Status: AC
  Filled 2013-07-04: qty 20000

## 2013-07-04 MED ORDER — MORPHINE SULFATE 2 MG/ML IJ SOLN
2.0000 mg | INTRAMUSCULAR | Status: DC | PRN
  Administered 2013-07-04 – 2013-07-06 (×9): 2 mg via INTRAVENOUS
  Filled 2013-07-04: qty 1
  Filled 2013-07-04 (×3): qty 2
  Filled 2013-07-04 (×2): qty 1

## 2013-07-04 MED ORDER — METOPROLOL TARTRATE 12.5 MG HALF TABLET
12.5000 mg | ORAL_TABLET | Freq: Two times a day (BID) | ORAL | Status: DC
  Administered 2013-07-05 (×2): 12.5 mg via ORAL
  Filled 2013-07-04 (×5): qty 1

## 2013-07-04 MED ORDER — SODIUM CHLORIDE 0.9 % IJ SOLN: 10.0000 mL | INTRAMUSCULAR | Status: DC | PRN

## 2013-07-04 MED ORDER — PROPOFOL 10 MG/ML IV BOLUS
INTRAVENOUS | Status: DC | PRN
  Administered 2013-07-04 (×2): 20 mg via INTRAVENOUS
  Administered 2013-07-04: 30 mg via INTRAVENOUS
  Administered 2013-07-04: 20 mg via INTRAVENOUS

## 2013-07-04 MED ORDER — THROMBIN 20000 UNITS EX SOLR
OROMUCOSAL | Status: DC | PRN
  Administered 2013-07-04 (×3): via TOPICAL

## 2013-07-04 MED ORDER — ASPIRIN EC 325 MG PO TBEC
325.0000 mg | DELAYED_RELEASE_TABLET | Freq: Every day | ORAL | Status: DC
  Administered 2013-07-05 – 2013-07-07 (×3): 325 mg via ORAL
  Filled 2013-07-04 (×3): qty 1

## 2013-07-04 MED ORDER — PROPOFOL 10 MG/ML IV BOLUS
INTRAVENOUS | Status: AC
  Filled 2013-07-04: qty 20

## 2013-07-04 MED ORDER — MIDAZOLAM HCL 2 MG/2ML IJ SOLN: 2.0000 mg | INTRAMUSCULAR | Status: DC | PRN

## 2013-07-04 MED ORDER — ACETAMINOPHEN 650 MG RE SUPP
650.0000 mg | Freq: Once | RECTAL | Status: AC
  Administered 2013-07-04: 650 mg via RECTAL

## 2013-07-04 MED FILL — Sodium Chloride IV Soln 0.9%: INTRAVENOUS | Qty: 2000 | Status: AC

## 2013-07-04 MED FILL — Sodium Bicarbonate IV Soln 8.4%: INTRAVENOUS | Qty: 50 | Status: AC

## 2013-07-04 MED FILL — Heparin Sodium (Porcine) Inj 1000 Unit/ML: INTRAMUSCULAR | Qty: 30 | Status: AC

## 2013-07-04 MED FILL — Mannitol IV Soln 20%: INTRAVENOUS | Qty: 500 | Status: AC

## 2013-07-04 MED FILL — Heparin Sodium (Porcine) Inj 1000 Unit/ML: INTRAMUSCULAR | Qty: 10 | Status: AC

## 2013-07-04 MED FILL — Lidocaine HCl IV Inj 20 MG/ML: INTRAVENOUS | Qty: 5 | Status: AC

## 2013-07-04 MED FILL — Electrolyte-R (PH 7.4) Solution: INTRAVENOUS | Qty: 4000 | Status: AC

## 2013-07-04 SURGICAL SUPPLY — 105 items
ATTRACTOMAT 16X20 MAGNETIC DRP (DRAPES) ×4 IMPLANT
BAG DECANTER FOR FLEXI CONT (MISCELLANEOUS) ×4 IMPLANT
BANDAGE ELASTIC 4 VELCRO ST LF (GAUZE/BANDAGES/DRESSINGS) ×4 IMPLANT
BANDAGE ELASTIC 6 VELCRO ST LF (GAUZE/BANDAGES/DRESSINGS) ×4 IMPLANT
BANDAGE GAUZE ELAST BULKY 4 IN (GAUZE/BANDAGES/DRESSINGS) ×4 IMPLANT
BASKET HEART  (ORDER IN 25'S) (MISCELLANEOUS) ×1
BASKET HEART (ORDER IN 25'S) (MISCELLANEOUS) ×1
BASKET HEART (ORDER IN 25S) (MISCELLANEOUS) ×2 IMPLANT
BENZOIN TINCTURE PRP APPL 2/3 (GAUZE/BANDAGES/DRESSINGS) ×4 IMPLANT
BLADE 11 SAFETY STRL DISP (BLADE) ×4 IMPLANT
BLADE STERNUM SYSTEM 6 (BLADE) ×4 IMPLANT
CANISTER SUCTION 2500CC (MISCELLANEOUS) ×4 IMPLANT
CARDIAC SUCTION (MISCELLANEOUS) ×4 IMPLANT
CATH ROBINSON RED A/P 18FR (CATHETERS) ×8 IMPLANT
CATH THORACIC 28FR (CATHETERS) ×4 IMPLANT
CATH THORACIC 36FR (CATHETERS) ×4 IMPLANT
CATH THORACIC 36FR RT ANG (CATHETERS) ×4 IMPLANT
CLIP FOGARTY SPRING 6M (CLIP) ×4 IMPLANT
CLIP TI MEDIUM 24 (CLIP) IMPLANT
CLIP TI WIDE RED SMALL 24 (CLIP) IMPLANT
CLOSURE STERI-STRIP 1/2X4 (GAUZE/BANDAGES/DRESSINGS) ×1
CLOSURE WOUND 1/2 X4 (GAUZE/BANDAGES/DRESSINGS) ×1
CLSR STERI-STRIP ANTIMIC 1/2X4 (GAUZE/BANDAGES/DRESSINGS) ×3 IMPLANT
COVER SURGICAL LIGHT HANDLE (MISCELLANEOUS) ×4 IMPLANT
CRADLE DONUT ADULT HEAD (MISCELLANEOUS) ×4 IMPLANT
DRAPE CARDIOVASCULAR INCISE (DRAPES) ×2
DRAPE SLUSH/WARMER DISC (DRAPES) ×4 IMPLANT
DRAPE SRG 135X102X78XABS (DRAPES) ×2 IMPLANT
DRSG COVADERM 4X14 (GAUZE/BANDAGES/DRESSINGS) ×4 IMPLANT
ELECT CAUTERY BLADE 6.4 (BLADE) ×4 IMPLANT
ELECT REM PT RETURN 9FT ADLT (ELECTROSURGICAL) ×8
ELECTRODE REM PT RTRN 9FT ADLT (ELECTROSURGICAL) ×4 IMPLANT
GLOVE BIO SURGEON STRL SZ 6 (GLOVE) IMPLANT
GLOVE BIO SURGEON STRL SZ 6.5 (GLOVE) IMPLANT
GLOVE BIO SURGEON STRL SZ7 (GLOVE) IMPLANT
GLOVE BIO SURGEON STRL SZ7.5 (GLOVE) IMPLANT
GLOVE BIO SURGEONS STRL SZ 6.5 (GLOVE)
GLOVE BIOGEL PI IND STRL 6 (GLOVE) IMPLANT
GLOVE BIOGEL PI IND STRL 6.5 (GLOVE) IMPLANT
GLOVE BIOGEL PI IND STRL 7.0 (GLOVE) IMPLANT
GLOVE BIOGEL PI INDICATOR 6 (GLOVE)
GLOVE BIOGEL PI INDICATOR 6.5 (GLOVE)
GLOVE BIOGEL PI INDICATOR 7.0 (GLOVE)
GLOVE EUDERMIC 7 POWDERFREE (GLOVE) ×8 IMPLANT
GLOVE ORTHO TXT STRL SZ7.5 (GLOVE) IMPLANT
GOWN STRL REUS W/ TWL LRG LVL3 (GOWN DISPOSABLE) ×8 IMPLANT
GOWN STRL REUS W/ TWL XL LVL3 (GOWN DISPOSABLE) ×2 IMPLANT
GOWN STRL REUS W/TWL LRG LVL3 (GOWN DISPOSABLE) ×8
GOWN STRL REUS W/TWL XL LVL3 (GOWN DISPOSABLE) ×2
HEMOSTAT POWDER SURGIFOAM 1G (HEMOSTASIS) ×12 IMPLANT
HEMOSTAT SURGICEL 2X14 (HEMOSTASIS) ×4 IMPLANT
INSERT FOGARTY 61MM (MISCELLANEOUS) IMPLANT
INSERT FOGARTY XLG (MISCELLANEOUS) IMPLANT
KIT BASIN OR (CUSTOM PROCEDURE TRAY) ×4 IMPLANT
KIT CATH CPB BARTLE (MISCELLANEOUS) ×4 IMPLANT
KIT ROOM TURNOVER OR (KITS) ×4 IMPLANT
KIT SUCTION CATH 14FR (SUCTIONS) ×4 IMPLANT
KIT VASOVIEW W/TROCAR VH 2000 (KITS) ×4 IMPLANT
NS IRRIG 1000ML POUR BTL (IV SOLUTION) ×20 IMPLANT
PACK OPEN HEART (CUSTOM PROCEDURE TRAY) ×4 IMPLANT
PAD ARMBOARD 7.5X6 YLW CONV (MISCELLANEOUS) ×8 IMPLANT
PAD ELECT DEFIB RADIOL ZOLL (MISCELLANEOUS) ×4 IMPLANT
PENCIL BUTTON HOLSTER BLD 10FT (ELECTRODE) ×4 IMPLANT
PUNCH AORTIC ROTATE 4.0MM (MISCELLANEOUS) IMPLANT
PUNCH AORTIC ROTATE 4.5MM 8IN (MISCELLANEOUS) ×4 IMPLANT
PUNCH AORTIC ROTATE 5MM 8IN (MISCELLANEOUS) IMPLANT
SET CARDIOPLEGIA MPS 5001102 (MISCELLANEOUS) ×4 IMPLANT
SPONGE GAUZE 4X4 12PLY (GAUZE/BANDAGES/DRESSINGS) ×8 IMPLANT
SPONGE INTESTINAL PEANUT (DISPOSABLE) IMPLANT
SPONGE LAP 18X18 X RAY DECT (DISPOSABLE) ×4 IMPLANT
SPONGE LAP 4X18 X RAY DECT (DISPOSABLE) ×4 IMPLANT
STRIP CLOSURE SKIN 1/2X4 (GAUZE/BANDAGES/DRESSINGS) ×3 IMPLANT
SUT BONE WAX W31G (SUTURE) ×4 IMPLANT
SUT MNCRL AB 4-0 PS2 18 (SUTURE) ×4 IMPLANT
SUT PROLENE 3 0 SH DA (SUTURE) IMPLANT
SUT PROLENE 3 0 SH1 36 (SUTURE) ×4 IMPLANT
SUT PROLENE 4 0 RB 1 (SUTURE)
SUT PROLENE 4 0 SH DA (SUTURE) IMPLANT
SUT PROLENE 4-0 RB1 .5 CRCL 36 (SUTURE) IMPLANT
SUT PROLENE 5 0 C 1 36 (SUTURE) IMPLANT
SUT PROLENE 6 0 C 1 30 (SUTURE) ×4 IMPLANT
SUT PROLENE 7 0 BV 1 (SUTURE) IMPLANT
SUT PROLENE 7 0 BV1 MDA (SUTURE) ×8 IMPLANT
SUT PROLENE 8 0 BV175 6 (SUTURE) IMPLANT
SUT SILK  1 MH (SUTURE)
SUT SILK 1 MH (SUTURE) IMPLANT
SUT STEEL STERNAL CCS#1 18IN (SUTURE) IMPLANT
SUT STEEL SZ 6 DBL 3X14 BALL (SUTURE) IMPLANT
SUT VIC AB 1 CTX 36 (SUTURE) ×10
SUT VIC AB 1 CTX36XBRD ANBCTR (SUTURE) ×10 IMPLANT
SUT VIC AB 2-0 CT1 27 (SUTURE) ×2
SUT VIC AB 2-0 CT1 TAPERPNT 27 (SUTURE) ×2 IMPLANT
SUT VIC AB 2-0 CTX 27 (SUTURE) IMPLANT
SUT VIC AB 3-0 SH 27 (SUTURE)
SUT VIC AB 3-0 SH 27X BRD (SUTURE) IMPLANT
SUT VIC AB 3-0 X1 27 (SUTURE) IMPLANT
SUT VICRYL 4-0 PS2 18IN ABS (SUTURE) IMPLANT
SUTURE E-PAK OPEN HEART (SUTURE) ×4 IMPLANT
SYSTEM SAHARA CHEST DRAIN ATS (WOUND CARE) ×4 IMPLANT
TOWEL OR 17X24 6PK STRL BLUE (TOWEL DISPOSABLE) ×4 IMPLANT
TOWEL OR 17X26 10 PK STRL BLUE (TOWEL DISPOSABLE) ×4 IMPLANT
TRAY FOLEY IC TEMP SENS 16FR (CATHETERS) ×4 IMPLANT
TUBING INSUFFLATION 10FT LAP (TUBING) ×4 IMPLANT
UNDERPAD 30X30 INCONTINENT (UNDERPADS AND DIAPERS) ×4 IMPLANT
WATER STERILE IRR 1000ML POUR (IV SOLUTION) ×8 IMPLANT

## 2013-07-04 NOTE — Procedures (Signed)
Extubation Procedure Note  Patient Details:   Name: Amanda Curtis DOB: 08/06/1935 MRN: [id number]   Airway Documentation:     Evaluation  O2 sats: stable throughout Complications: No apparent complications Patient did tolerate procedure well. Bilateral Breath Sounds: Clear;Diminished Suctioning: Airway Yes  Pt tolerated wean, 444mL VC, -20 NIF, positive for cuff leak, extubated to 4lpm [place]. No dyspnea or stridor noted after extubation. All vitals are within normal limits at this time. RT will continue to monitor.   Mariam Dollar 07/04/2013, 5:37 PM

## 2013-07-04 NOTE — Anesthesia Postprocedure Evaluation (Signed)
  Anesthesia Post-op Note  Patient: Amanda Curtis  Procedure(s) Performed: Procedure(s): CORONARY ARTERY BYPASS GRAFTING (CABG) times four using left internal mammary artery and right saphenous leg vein. (N/A) INTRAOPERATIVE TRANSESOPHAGEAL ECHOCARDIOGRAM (N/A)  Patient Location: ICU  Anesthesia Type:General  Level of Consciousness: sedated  Airway and Oxygen Therapy: Patient remains intubated per anesthesia plan  Post-op Pain: none  Post-op Assessment: Post-op Vital signs reviewed  Post-op Vital Signs: Reviewed and stable  Last Vitals:  Filed Vitals:   07/04/13 0506  BP: 135/77  Pulse: 56  Temp: 36.7 C  Resp: 18    Complications: No apparent anesthesia complications

## 2013-07-04 NOTE — Op Note (Signed)
CARDIOVASCULAR SURGERY OPERATIVE NOTE  07/04/2013  Surgeon:  Gaye Pollack, MD  First Assistant: Suzzanne Cloud,  PA-C   Preoperative Diagnosis:  Severe multi-vessel coronary artery disease   Postoperative Diagnosis:  Same   Procedure:  1. Median Sternotomy 2. Extracorporeal circulation 3.   Coronary artery bypass grafting x 4   Left internal mammary graft to the LAD  SVG to diagonal  SVG to OM  SVG to PDA 4.   Endoscopic vein harvest from the right leg   Anesthesia:  General Endotracheal   Clinical History/Surgical Indication:  The patient has a history of diabetes, hypertension, hypercholesterolemia, and a family history of premature coronary artery disease. She reports having chest discomfort, fatigue, dizziness and generalized weakness over the past year that has been worsening. She moved to [place] from [place] about 2 years ago and says she had a previous stress test in [place] that was normal. She recently underwent a Myoview stress test here that was an intermediate risk study showing a moderate area of ischemia in the inferolateral and anterolateral walls. She was scheduled for a cath but did not show up because she was scared and requested a second opinion from Dr. Wyline Copas in [facility]. She had a cardiac CT which showed a high calcium score and evidence of severe 3-vessel CAD. Cath today shows severe 3-vessel CAD as noted below.   Cardiac Catheterization Procedure Note  Name: Amanda Curtis  MRN: [id number]  DOB: May 02, 1935  Procedure: Left Heart Cath, Selective Coronary Angiography, LV angiography  Indication: Daily chest burning. 3 vessel CAD by CT, abnormal Myoview stress test, diabetes.  Procedural details: Initially the right wrist was prepped, draped, and anesthetized with 1% lidocaine. A 5 French sheath was advanced into the right radial artery without difficulty.  There was resistance at the elbow with passage of wires. A radial artery angiogram was performed and demonstrated a radio-ulnar loop. I was able to pass a cougar wire around the loop, but a 4 French endhole catheter would not advance. Attention was then turned to the right groin. The right groin was prepped, draped, and anesthetized with 1% lidocaine. Using modified Seldinger technique, a 4 French sheath was introduced into the right femoral artery. Standard Judkins catheters were used for coronary angiography and left ventriculography. The JR 4 catheter was used to perform left subclavian angiography. Catheter exchanges were performed over a guidewire. There were no immediate procedural complications. The patient was transferred to the post catheterization recovery area for further monitoring.  Procedural Findings:  Hemodynamics:  AO 116/61 with a mean of 83  LV 114/5  Coronary angiography:  Coronary dominance: right  Left mainstem: The left main is heavily calcified. The vessel has 30-40% distal stenosis before it divides into the LAD and left circumflex.  Left anterior descending (LAD): The LAD has severe diffuse calcification and severe diffuse stenosis to the proximal and midportion. There are sequential 80% proximal LAD stenoses then 95-99% mid LAD stenosis just after the first diagonal. The diagonal branch has diffuse 90% stenosis. The LAD is then diffusely diseased down to the apex with nonobstructive stenosis distally, but 60-70% stenosis at the origin of the third diagonal branch.  Left circumflex (LCx): The left circumflex as heavy calcification. The proximal vessel is patent. The first obtuse marginal divides into twin vessels essentially supplying OM 1 and OM 2 territories. Prior to the bifurcation there is 90% stenosis present.  Right coronary artery (RCA): The RCA is a large, dominant vessel. The vessel  has severe diffuse calcification and severe 95% mid vessel stenosis. The distal vessel  before the bifurcation of the PDA and PLA branches has 60-70% stenosis. The PDA and PLA branches are patent.  Left subclavian angiography: The subclavian has heavy calcification at its origin. There is no significant stenosis. The LIMA appears suitable for an arterial conduit.  Left ventriculography: Deferred  Final Conclusions:  1. Severe native three-vessel coronary artery disease with severe calcific stenosis of the LAD, left circumflex, and right coronary arteries  2. Patent left subclavian artery with patent left internal mammary artery  3. Known normal LV function by nuclear stress test with LVEF 73%  Recommendations: Hospital admission in this patient with severe multivessel coronary artery disease and symptoms of daily chest burning consistent with unstable angina. We'll start her on IV heparin.  Sherren Mocha  06/30/2013, 1:58 PM   She has severe multi-vessel coronary artery disease with unstable anginal symptoms. With the diffuse nature of her disease, diabetes, and relatively small coronary vessels I think CABG is the best treatment to resolve her symptoms, improve her quality of life and improve her long term prognosis. I discussed the operative procedure with the patient and her grandson including alternatives, benefits and risks; including but not limited to bleeding, blood transfusion, infection, stroke, myocardial infarction, graft failure, heart block requiring a permanent pacemaker, organ dysfunction, and death. [name] understands and agrees to proceed.   Preparation:  The patient was seen in the preoperative holding area and the correct patient, correct operation were confirmed with the patient after reviewing the medical record and catheterization. The consent was signed by me. Preoperative antibiotics were given. A pulmonary arterial line and radial arterial line were placed by the anesthesia team. The patient was taken back to the operating room and positioned supine on  the operating room table. After being placed under general endotracheal anesthesia by the anesthesia team a foley catheter was placed. The neck, chest, abdomen, and both legs were prepped with betadine soap and solution and draped in the usual sterile manner. A surgical time-out was taken and the correct patient and operative procedure were confirmed with the nursing and anesthesia staff.   Cardiopulmonary Bypass:  A median sternotomy was performed. The pericardium was opened in the midline. Right ventricular function appeared normal. The ascending aorta was of normal size and had no palpable plaque. There were no contraindications to aortic cannulation or cross-clamping. The patient was fully systemically heparinized and the ACT was maintained > 400 sec. The proximal aortic arch was cannulated with a 20 F aortic cannula for arterial inflow. Venous cannulation was performed via the right atrial appendage using a two-staged venous cannula. An antegrade cardioplegia/vent cannula was inserted into the mid-ascending aorta. Aortic occlusion was performed with a single cross-clamp. Systemic cooling to 32 degrees Centigrade and topical cooling of the heart with iced saline were used. Hyperkalemic antegrade cold blood cardioplegia was used to induce diastolic arrest and was then given at about 20 minute intervals throughout the period of arrest to maintain myocardial temperature at or below 10 degrees centigrade. A temperature probe was inserted into the interventricular septum and an insulating pad was placed in the pericardium.   Left internal mammary harvest:  The left side of the sternum was retracted using the Rultract retractor. The left internal mammary artery was harvested as a pedicle graft. All side branches were clipped. It was a medium-sized vessel of good quality with excellent blood flow. It was ligated distally and divided.  It was sprayed with topical papaverine solution to prevent  vasospasm.   Endoscopic vein harvest:  The right greater saphenous vein was harvested endoscopically through a 2 cm incision medial to the right knee. It was harvested from the upper thigh to below the knee. It was a medium-sized vein of good quality. The side branches were all ligated with 4-0 silk ties.    Coronary arteries:  The coronary arteries were examined.   LAD:  Heavily diseased throughout the proximal and mid portions. Only the very distal end was graftable. The diagonal was diseased proximally but the remainder of the vessel was without disease but small  LCX:  The OM was a moderate sized vessel with mild distal disease  RCA:  The RCA was diffusely diseased. The PDA was mildly diseased proximally. The PL had no visible disease.   Grafts:  1. LIMA to the LAD: 1.6 mm. It was sewn end to side using 8-0 prolene continuous suture. 2. SVG to diagonal:  1.5 mm. It was sewn end to side using 7-0 prolene continuous suture. 3. SVG to OM:  1.6  mm. It was sewn end to side using 7-0 prolene continuous suture. 4. SVG to PDA :  1.5 mm. It was sewn end to side using 7-0 prolene continuous suture.  The proximal vein graft anastomoses were performed to the mid-ascending aorta using continuous 6-0 prolene suture. Graft markers were placed around the proximal anastomoses.   Completion:  The patient was rewarmed to 37 degrees Centigrade. The clamp was removed from the LIMA pedicle and there was rapid warming of the septum and return of ventricular fibrillation. The crossclamp was removed with a time of 85 minutes. There was spontaneous return of sinus rhythm. The distal and proximal anastomoses were checked for hemostasis. The position of the grafts was satisfactory. Two temporary epicardial pacing wires were placed on the right atrium and two on the right ventricle. The patient was weaned from CPB without difficulty on no inotropes. CPB time was 103 minutes. Cardiac output was 4 LPM. Heparin  was fully reversed with protamine and the aortic and venous cannulas removed. Hemostasis was achieved. Mediastinal and left pleural drainage tubes were placed. The sternum was closed with  #6 stainless steel wires. The fascia was closed with continuous # 1 vicryl suture. The subcutaneous tissue was closed with 2-0 vicryl continuous suture. The skin was closed with 3-0 vicryl subcuticular suture. All sponge, needle, and instrument counts were reported correct at the end of the case. Dry sterile dressings were placed over the incisions and around the chest tubes which were connected to pleurevac suction. The patient was then transported to the surgical intensive care unit in critical but stable condition.

## 2013-07-04 NOTE — Transfer of Care (Signed)
Immediate Anesthesia Transfer of Care Note  Patient: Amanda Curtis  Procedure(s) Performed: Procedure(s): CORONARY ARTERY BYPASS GRAFTING (CABG) times four using left internal mammary artery and right saphenous leg vein. (N/A) INTRAOPERATIVE TRANSESOPHAGEAL ECHOCARDIOGRAM (N/A)  Patient Location: SICU  Anesthesia Type:General  Level of Consciousness: Patient remains intubated per anesthesia plan  Airway & Oxygen Therapy: Patient remains intubated per anesthesia plan and Patient placed on Ventilator (see vital sign flow sheet for setting)  Post-op Assessment: Report given to PACU RN and Post -op Vital signs reviewed and stable  Post vital signs: Reviewed and stable  Complications: No apparent anesthesia complications

## 2013-07-04 NOTE — Brief Op Note (Signed)
06/30/2013 - 07/04/2013  10:52 AM  PATIENT:  Amanda Curtis  78 y.o. female  PRE-OPERATIVE DIAGNOSIS:  CAD  POST-OPERATIVE DIAGNOSIS:  CAD  PROCEDURE:  CORONARY ARTERY BYPASS GRAFTING x 4  (LIMA-LAD, SVG-D, SVG-OM, SVG-PD) ENDOSCOPIC VEIN HARVEST RIGHT LEG.  SURGEON:  Gaye Pollack, MD  ASSISTANT: Suzzanne Cloud, PA-C  ANESTHESIA:   general  PATIENT CONDITION:  ICU - intubated and hemodynamically stable.  PRE-OPERATIVE WEIGHT: 71 kg

## 2013-07-04 NOTE — Progress Notes (Signed)
Started SICU vent wean protocol. RT aware, RN aware.

## 2013-07-04 NOTE — Progress Notes (Signed)
Patient ID: Amanda Curtis, female   DOB: Feb 22, 1936, 78 y.o.   MRN: [id number]  SICU Evening Rounds:   Hemodynamically stable  CI = 1.7  Awake on vent. Weaning. Urine output good  CT output low  CBC    Component Value Date/Time   WBC 18.8* 07/04/2013 1300   RBC 3.47* 07/04/2013 1300   HGB 10.6* 07/04/2013 1300   HCT 32.6* 07/04/2013 1300   PLT 198 07/04/2013 1300   MCV 93.9 07/04/2013 1300   MCH 30.5 07/04/2013 1300   MCHC 32.5 07/04/2013 1300   RDW 14.3 07/04/2013 1300   LYMPHSABS 1.9 06/26/2013 1006   MONOABS 0.6 06/26/2013 1006   EOSABS 0.1 06/26/2013 1006   BASOSABS 0.1 06/26/2013 1006     BMET    Component Value Date/Time   NA 137 07/04/2013 1259   K 4.8 07/04/2013 1259   CL 101 07/04/2013 0440   CO2 25 07/04/2013 0440   GLUCOSE 121* 07/04/2013 1259   BUN 27* 07/04/2013 0440   CREATININE 0.96 07/04/2013 0440   CREATININE 1.11* 06/06/2013 1510   CALCIUM 9.3 07/04/2013 0440   GFRNONAA 56* 07/04/2013 0440   GFRNONAA 56* 02/07/2013 1146   GFRAA 65* 07/04/2013 0440   GFRAA 64 02/07/2013 1146     A/P:  Stable postop course. Continue current plans. Extubate soon .

## 2013-07-04 NOTE — Anesthesia Preprocedure Evaluation (Addendum)
Anesthesia Evaluation  Patient identified by MRN, date of birth, ID band Patient awake    Reviewed: Allergy & Precautions, H&P , NPO status , Patient's Chart, lab work & pertinent test results  History of Anesthesia Complications Negative for: history of anesthetic complications  Airway Mallampati: II TM Distance: >3 FB Neck ROM: Full    Dental  (+) Teeth Intact, Implants, Partial Upper   Pulmonary neg pulmonary ROS,  breath sounds clear to auscultation        Cardiovascular hypertension, Pt. on medications and Pt. on home beta blockers + angina with exertion + CAD - dysrhythmias - Valvular Problems/MurmursRhythm:Regular Rate:Normal   Cath report: Final Conclusions:   1. Severe native three-vessel coronary artery disease with severe calcific stenosis of the LAD, left circumflex, and right coronary arteries 2. Patent left subclavian artery with patent left internal mammary artery 3. Known normal LV function by nuclear stress test with LVEF 73%      Neuro/Psych Anxiety negative neurological ROS     GI/Hepatic negative GI ROS, Neg liver ROS,   Endo/Other  diabetes, Type 2, Oral Hypoglycemic AgentsHypothyroidism   Renal/GU negative Renal ROS     Musculoskeletal   Abdominal   Peds  Hematology   Anesthesia Other Findings   Reproductive/Obstetrics                        Anesthesia Physical Anesthesia Plan  ASA: IV  Anesthesia Plan: General   Post-op Pain Management:    Induction: Intravenous  Airway Management Planned: Oral ETT  Additional Equipment: Arterial line, CVP, PA Cath and 3D TEE  Intra-op Plan:   Post-operative Plan: Post-operative intubation/ventilation  Informed Consent: I have reviewed the patients History and Physical, chart, labs and discussed the procedure including the risks, benefits and alternatives for the proposed anesthesia with the patient or authorized  representative who has indicated his/her understanding and acceptance.   Dental advisory given  Plan Discussed with: Anesthesiologist, Surgeon and CRNA  Anesthesia Plan Comments:        Anesthesia Quick Evaluation

## 2013-07-05 ENCOUNTER — Inpatient Hospital Stay (HOSPITAL_COMMUNITY): Payer: Medicare Other

## 2013-07-05 LAB — CBC
HCT: 33.1 % — ABNORMAL LOW (ref 36.0–46.0)
HCT: 32.7 % — ABNORMAL LOW (ref 36.0–46.0)
Hemoglobin: 10.7 g/dL — ABNORMAL LOW (ref 12.0–15.0)
Hemoglobin: 10.4 g/dL — ABNORMAL LOW (ref 12.0–15.0)
MCH: 30.9 pg (ref 26.0–34.0)
MCH: 30.8 pg (ref 26.0–34.0)
MCHC: 32.3 g/dL (ref 30.0–36.0)
MCHC: 31.8 g/dL (ref 30.0–36.0)
MCV: 95.7 fL (ref 78.0–100.0)
MCV: 96.7 fL (ref 78.0–100.0)
PLATELETS: 215 10*3/uL (ref 150–400)
Platelets: 213 10*3/uL (ref 150–400)
RBC: 3.46 MIL/uL — AB (ref 3.87–5.11)
RBC: 3.38 MIL/uL — ABNORMAL LOW (ref 3.87–5.11)
RDW: 14.6 % (ref 11.5–15.5)
RDW: 14.9 % (ref 11.5–15.5)
WBC: 16.8 10*3/uL — AB (ref 4.0–10.5)
WBC: 20 10*3/uL — ABNORMAL HIGH (ref 4.0–10.5)

## 2013-07-05 LAB — BASIC METABOLIC PANEL
BUN: 22 mg/dL (ref 6–23)
CHLORIDE: 106 meq/L (ref 96–112)
CO2: 24 mEq/L (ref 19–32)
Calcium: 7.9 mg/dL — ABNORMAL LOW (ref 8.4–10.5)
Creatinine, Ser: 0.94 mg/dL (ref 0.50–1.10)
GFR calc Af Amer: 67 mL/min — ABNORMAL LOW (ref >90)
GFR calc non Af Amer: 57 mL/min — ABNORMAL LOW (ref >90)
Glucose, Bld: 106 mg/dL — ABNORMAL HIGH (ref 70–99)
Potassium: 4.5 mEq/L (ref 3.7–5.3)
Sodium: 141 mEq/L (ref 137–147)

## 2013-07-05 LAB — GLUCOSE, CAPILLARY
GLUCOSE-CAPILLARY: 106 mg/dL — AB (ref 70–99)
GLUCOSE-CAPILLARY: 110 mg/dL — AB (ref 70–99)
GLUCOSE-CAPILLARY: 112 mg/dL — AB (ref 70–99)
GLUCOSE-CAPILLARY: 112 mg/dL — AB (ref 70–99)
GLUCOSE-CAPILLARY: 136 mg/dL — AB (ref 70–99)
GLUCOSE-CAPILLARY: 156 mg/dL — AB (ref 70–99)
GLUCOSE-CAPILLARY: 97 mg/dL (ref 70–99)
GLUCOSE-CAPILLARY: 98 mg/dL (ref 70–99)
Glucose-Capillary: 101 mg/dL — ABNORMAL HIGH (ref 70–99)
Glucose-Capillary: 105 mg/dL — ABNORMAL HIGH (ref 70–99)
Glucose-Capillary: 108 mg/dL — ABNORMAL HIGH (ref 70–99)
Glucose-Capillary: 108 mg/dL — ABNORMAL HIGH (ref 70–99)
Glucose-Capillary: 119 mg/dL — ABNORMAL HIGH (ref 70–99)
Glucose-Capillary: 128 mg/dL — ABNORMAL HIGH (ref 70–99)
Glucose-Capillary: 142 mg/dL — ABNORMAL HIGH (ref 70–99)
Glucose-Capillary: 149 mg/dL — ABNORMAL HIGH (ref 70–99)
Glucose-Capillary: 173 mg/dL — ABNORMAL HIGH (ref 70–99)
Glucose-Capillary: 99 mg/dL (ref 70–99)

## 2013-07-05 LAB — POCT I-STAT, CHEM 8
BUN: 26 mg/dL — ABNORMAL HIGH (ref 6–23)
CREATININE: 1.3 mg/dL — AB (ref 0.50–1.10)
Calcium, Ion: 1.15 mmol/L (ref 1.13–1.30)
Chloride: 98 mEq/L (ref 96–112)
Glucose, Bld: 117 mg/dL — ABNORMAL HIGH (ref 70–99)
HCT: 33 % — ABNORMAL LOW (ref 36.0–46.0)
HEMOGLOBIN: 11.2 g/dL — AB (ref 12.0–15.0)
POTASSIUM: 4.3 meq/L (ref 3.7–5.3)
Sodium: 137 mEq/L (ref 137–147)
TCO2: 26 mmol/L (ref 0–100)

## 2013-07-05 LAB — CREATININE, SERUM
CREATININE: 1.17 mg/dL — AB (ref 0.50–1.10)
GFR calc Af Amer: 51 mL/min — ABNORMAL LOW (ref >90)
GFR, EST NON AFRICAN AMERICAN: 44 mL/min — AB (ref >90)

## 2013-07-05 LAB — MAGNESIUM
MAGNESIUM: 2.8 mg/dL — AB (ref 1.5–2.5)
MAGNESIUM: 2.5 mg/dL (ref 1.5–2.5)

## 2013-07-05 MED ORDER — INSULIN ASPART 100 UNIT/ML ~~LOC~~ SOLN
0.0000 [IU] | SUBCUTANEOUS | Status: DC
  Administered 2013-07-05 – 2013-07-06 (×6): 2 [IU] via SUBCUTANEOUS

## 2013-07-05 MED ORDER — METOCLOPRAMIDE HCL 5 MG/ML IJ SOLN
10.0000 mg | Freq: Four times a day (QID) | INTRAMUSCULAR | Status: AC
  Administered 2013-07-05 – 2013-07-06 (×4): 10 mg via INTRAVENOUS
  Filled 2013-07-05 (×4): qty 2

## 2013-07-05 MED ORDER — ALPRAZOLAM 0.25 MG PO TABS
0.2500 mg | ORAL_TABLET | Freq: Three times a day (TID) | ORAL | Status: DC | PRN
  Administered 2013-07-05 – 2013-07-06 (×3): 0.25 mg via ORAL
  Filled 2013-07-05 (×3): qty 1

## 2013-07-05 MED ORDER — INSULIN DETEMIR 100 UNIT/ML ~~LOC~~ SOLN
20.0000 [IU] | Freq: Every day | SUBCUTANEOUS | Status: DC
  Administered 2013-07-05 – 2013-07-06 (×2): 20 [IU] via SUBCUTANEOUS
  Filled 2013-07-05 (×3): qty 0.2

## 2013-07-05 MED ORDER — ENOXAPARIN SODIUM 40 MG/0.4ML ~~LOC~~ SOLN
40.0000 mg | Freq: Every day | SUBCUTANEOUS | Status: DC
  Administered 2013-07-05 – 2013-07-08 (×4): 40 mg via SUBCUTANEOUS
  Filled 2013-07-05 (×5): qty 0.4

## 2013-07-05 MED ORDER — FUROSEMIDE 10 MG/ML IJ SOLN
40.0000 mg | Freq: Once | INTRAMUSCULAR | Status: AC
  Administered 2013-07-05: 40 mg via INTRAVENOUS
  Filled 2013-07-05: qty 4

## 2013-07-05 MED ORDER — INSULIN DETEMIR 100 UNIT/ML ~~LOC~~ SOLN: 20.0000 [IU] | Freq: Every day | SUBCUTANEOUS | Status: DC

## 2013-07-05 NOTE — Progress Notes (Addendum)
1 Day Post-Op Procedure(s) (LRB): CORONARY ARTERY BYPASS GRAFTING (CABG) times four using left internal mammary artery and right saphenous leg vein. (N/A) INTRAOPERATIVE TRANSESOPHAGEAL ECHOCARDIOGRAM (N/A) Subjective:  No complaints  Objective: Vital signs in last 24 hours: Temp:  [96.1 F (35.6 C)-98.8 F (37.1 C)] 98.1 F (36.7 C) (05/09 0900) Pulse Rate:  [73-84] 75 (05/09 0900) Cardiac Rhythm:  [-] Normal sinus rhythm (05/09 0800) Resp:  [11-35] 25 (05/09 0900) BP: (84-151)/(55-109) 94/60 mmHg (05/09 0800) SpO2:  [95 %-100 %] 98 % (05/09 0900) Arterial Line BP: (81-152)/(46-93) 139/74 mmHg (05/09 0900) FiO2 (%):  [40 %-50 %] 40 % (05/08 1640) Weight:  [74 kg (163 lb 2.3 oz)] 74 kg (163 lb 2.3 oz) (05/09 0600)  Hemodynamic parameters for last 24 hours: PAP: (20-33)/(9-22) 33/20 mmHg CO:  [2.6 L/min-3.9 L/min] 3.9 L/min CI:  [1.4 L/min/m2-2.1 L/min/m2] 2.1 L/min/m2  Intake/Output from previous day: 05/08 0701 - 05/09 0700 In: 4279.9 [P.O.:600; I.V.:2550.9; Blood:449; NG/GT:30; IV Piggyback:650] Out: 5621 [Urine:3625; Blood:2319; Chest Tube:340] Intake/Output this shift: Total I/O In: 178.2 [I.V.:128.2; IV Piggyback:50] Out: 80 [Urine:70; Chest Tube:10]  General appearance: alert and cooperative Neurologic: intact Heart: regular rate and rhythm, S1, S2 normal, no murmur, click, rub or gallop Lungs: clear to auscultation bilaterally Extremities: edema mild Wound: dressing dry Small air leak from pleural tubes Lab Results:  Recent Labs  07/04/13 1800 07/05/13 0400  WBC 22.0* 16.8*  HGB 11.7* 10.7*  HCT 35.9* 33.1*  PLT 215 215   BMET:  Recent Labs  07/04/13 0440  07/04/13 1759 07/04/13 1800 07/05/13 0400  NA 140  < > 139  --  141  K 4.1  < > 5.0  --  4.5  CL 101  --  103  --  106  CO2 25  --   --   --  24  GLUCOSE 132*  < > 117*  --  106*  BUN 27*  --  26*  --  22  CREATININE 0.96  --  1.00 0.88 0.94  CALCIUM 9.3  --   --   --  7.9*  < > = values  in this interval not displayed.  PT/INR:  Recent Labs  07/04/13 1300  LABPROT 16.0*  INR 1.31   ABG    Component Value Date/Time   PHART 7.437 07/04/2013 1846   HCO3 26.7* 07/04/2013 1846   TCO2 28 07/04/2013 1846   ACIDBASEDEF 2.0 07/04/2013 1255   O2SAT 97.0 07/04/2013 1846   CBG (last 3)   Recent Labs  07/05/13 0359 07/05/13 0503 07/05/13 0606  GLUCAP 101* 112* 105*   CXR: left basilar atelectasis  ECG: Normal sinus. No acute changes  Assessment/Plan: S/P Procedure(s) (LRB): CORONARY ARTERY BYPASS GRAFTING (CABG) times four using left internal mammary artery and right saphenous leg vein. (N/A) INTRAOPERATIVE TRANSESOPHAGEAL ECHOCARDIOGRAM (N/A) Mobilize Diuresis Diabetes control d/c tubes/lines Continue foley due to diuresing patient and patient in ICU See progression orders   LOS: 5 days    Gaye Pollack 07/05/2013

## 2013-07-05 NOTE — Progress Notes (Signed)
Patient ID: Amanda Curtis, female   DOB: Sep 07, 1935, 78 y.o.   MRN: [id number]  SICU Evening Rounds:  Hemodynamically stable  Urine output ok.  Creat up to 1.3. Will hold off on further Toradol  CBC    Component Value Date/Time   WBC 20.0* 07/05/2013 1700   RBC 3.38* 07/05/2013 1700   HGB 11.2* 07/05/2013 1708   HCT 33.0* 07/05/2013 1708   PLT 213 07/05/2013 1700   MCV 96.7 07/05/2013 1700   MCH 30.8 07/05/2013 1700   MCHC 31.8 07/05/2013 1700   RDW 14.9 07/05/2013 1700   LYMPHSABS 1.9 06/26/2013 1006   MONOABS 0.6 06/26/2013 1006   EOSABS 0.1 06/26/2013 1006   BASOSABS 0.1 06/26/2013 1006    BMET    Component Value Date/Time   NA 137 07/05/2013 1708   K 4.3 07/05/2013 1708   CL 98 07/05/2013 1708   CO2 24 07/05/2013 0400   GLUCOSE 117* 07/05/2013 1708   BUN 26* 07/05/2013 1708   CREATININE 1.30* 07/05/2013 1708   CREATININE 1.11* 06/06/2013 1510   CALCIUM 7.9* 07/05/2013 0400   GFRNONAA 44* 07/05/2013 1700   GFRNONAA 56* 02/07/2013 1146   GFRAA 51* 07/05/2013 1700   GFRAA 64 02/07/2013 1146    Continue present course

## 2013-07-06 ENCOUNTER — Inpatient Hospital Stay (HOSPITAL_COMMUNITY): Payer: Medicare Other

## 2013-07-06 LAB — GLUCOSE, CAPILLARY
GLUCOSE-CAPILLARY: 89 mg/dL (ref 70–99)
GLUCOSE-CAPILLARY: 120 mg/dL — AB (ref 70–99)
Glucose-Capillary: 89 mg/dL (ref 70–99)
Glucose-Capillary: 151 mg/dL — ABNORMAL HIGH (ref 70–99)
Glucose-Capillary: 123 mg/dL — ABNORMAL HIGH (ref 70–99)
Glucose-Capillary: 94 mg/dL (ref 70–99)
Glucose-Capillary: 110 mg/dL — ABNORMAL HIGH (ref 70–99)

## 2013-07-06 LAB — CBC
HCT: 28 % — ABNORMAL LOW (ref 36.0–46.0)
Hemoglobin: 8.9 g/dL — ABNORMAL LOW (ref 12.0–15.0)
MCH: 31.1 pg (ref 26.0–34.0)
MCHC: 31.8 g/dL (ref 30.0–36.0)
MCV: 97.9 fL (ref 78.0–100.0)
PLATELETS: 190 10*3/uL (ref 150–400)
RBC: 2.86 MIL/uL — ABNORMAL LOW (ref 3.87–5.11)
RDW: 14.8 % (ref 11.5–15.5)
WBC: 14.3 10*3/uL — ABNORMAL HIGH (ref 4.0–10.5)

## 2013-07-06 LAB — BASIC METABOLIC PANEL
BUN: 25 mg/dL — ABNORMAL HIGH (ref 6–23)
CALCIUM: 7.9 mg/dL — AB (ref 8.4–10.5)
CO2: 26 mEq/L (ref 19–32)
Chloride: 100 mEq/L (ref 96–112)
Creatinine, Ser: 1.05 mg/dL (ref 0.50–1.10)
GFR calc Af Amer: 58 mL/min — ABNORMAL LOW (ref >90)
GFR, EST NON AFRICAN AMERICAN: 50 mL/min — AB (ref >90)
GLUCOSE: 87 mg/dL (ref 70–99)
Potassium: 3.7 mEq/L (ref 3.7–5.3)
Sodium: 135 mEq/L — ABNORMAL LOW (ref 137–147)

## 2013-07-06 MED ORDER — FUROSEMIDE 40 MG PO TABS
40.0000 mg | ORAL_TABLET | Freq: Every day | ORAL | Status: DC
  Administered 2013-07-06 – 2013-07-07 (×2): 40 mg via ORAL
  Filled 2013-07-06 (×3): qty 1

## 2013-07-06 MED ORDER — POTASSIUM CHLORIDE 10 MEQ/50ML IV SOLN
10.0000 meq | INTRAVENOUS | Status: AC | PRN
  Administered 2013-07-06 (×3): 10 meq via INTRAVENOUS
  Filled 2013-07-06 (×3): qty 50

## 2013-07-06 NOTE — Progress Notes (Signed)
Pt HR noted to have decr. from 64 bpm to 52-55 bpm after PM dose of Lopressor 12.37m PO; prior to dose, parameters were met to receive dose, with decr. SB HR subsequent BP was 88/54 MAP 62, Pt noted to be making adequate urine and aroused to verbal stimulus; Per post-op CABG orders external temp pacing was initiated in AAI at 70 bpm with mA of 10 with positive capture noted. MAP after pacer initiation was 65, will cont' to monitor and assess.

## 2013-07-06 NOTE — Progress Notes (Signed)
K+= 3.7 and creat= 1.05 w/ urine o/p > 30cc/hr; TCTS KCL protocol initiated with 10 mEq KCL in 50cc IV x 3, each over one hour.

## 2013-07-06 NOTE — Progress Notes (Signed)
2 Days Post-Op Procedure(s) (LRB): CORONARY ARTERY BYPASS GRAFTING (CABG) times four using left internal mammary artery and right saphenous leg vein. (N/A) INTRAOPERATIVE TRANSESOPHAGEAL ECHOCARDIOGRAM (N/A) Subjective:  Sore  Objective: Vital signs in last 24 hours: Temp:  [97.8 F (36.6 C)-98.1 F (36.7 C)] 97.8 F (36.6 C) (05/10 0800) Pulse Rate:  [32-98] 70 (05/10 0700) Cardiac Rhythm:  [-] Normal sinus rhythm (05/10 0730) Resp:  [9-23] 15 (05/10 0700) BP: (84-129)/(51-92) 129/71 mmHg (05/10 0700) SpO2:  [92 %-100 %] 95 % (05/10 0700) Arterial Line BP: (130-141)/(59-76) 130/59 mmHg (05/09 1400) Weight:  [74.2 kg (163 lb 9.3 oz)] 74.2 kg (163 lb 9.3 oz) (05/10 0600)  Hemodynamic parameters for last 24 hours:    Intake/Output from previous day: 05/09 0701 - 05/10 0700 In: 1414.5 [P.O.:600; I.V.:664.5; IV Piggyback:150] Out: 1610 [Urine:1205; Chest Tube:150] Intake/Output this shift: Total I/O In: 100 [IV Piggyback:100] Out: -   General appearance: sleepy but cooperative Neurologic: intact Heart: regular rate and rhythm, S1, S2 normal, no murmur, click, rub or gallop Lungs: clear to auscultation bilaterally Extremities: extremities normal, atraumatic, no cyanosis or edema Wound: incisions ok  Lab Results:  Recent Labs  07/05/13 1700 07/05/13 1708 07/06/13 0402  WBC 20.0*  --  14.3*  HGB 10.4* 11.2* 8.9*  HCT 32.7* 33.0* 28.0*  PLT 213  --  190   BMET:  Recent Labs  07/05/13 0400  07/05/13 1708 07/06/13 0402  NA 141  --  137 135*  K 4.5  --  4.3 3.7  CL 106  --  98 100  CO2 24  --   --  26  GLUCOSE 106*  --  117* 87  BUN 22  --  26* 25*  CREATININE 0.94  < > 1.30* 1.05  CALCIUM 7.9*  --   --  7.9*  < > = values in this interval not displayed.  PT/INR:  Recent Labs  07/04/13 1300  LABPROT 16.0*  INR 1.31   ABG    Component Value Date/Time   PHART 7.437 07/04/2013 1846   HCO3 26.7* 07/04/2013 1846   TCO2 26 07/05/2013 1708   ACIDBASEDEF 2.0  07/04/2013 1255   O2SAT 97.0 07/04/2013 1846   CBG (last 3)   Recent Labs  07/05/13 2008 07/05/13 2332 07/06/13 0355  GLUCAP 156* 98 89   CXR: ok  Assessment/Plan: S/P Procedure(s) (LRB): CORONARY ARTERY BYPASS GRAFTING (CABG) times four using left internal mammary artery and right saphenous leg vein. (N/A) INTRAOPERATIVE TRANSESOPHAGEAL ECHOCARDIOGRAM (N/A)  She is hemodynamically stable  Her HR is sinus 56 this am and she had a slow HR preop. Will stop beta blocker.  Mild volume excess: diurese   Remove pleural tubes and sleeve.  Mobilize   LOS: 6 days    Gaye Pollack 07/06/2013

## 2013-07-07 ENCOUNTER — Inpatient Hospital Stay (HOSPITAL_COMMUNITY): Payer: Medicare Other

## 2013-07-07 ENCOUNTER — Encounter (HOSPITAL_COMMUNITY): Payer: Self-pay | Admitting: Surgery

## 2013-07-07 LAB — BASIC METABOLIC PANEL
BUN: 25 mg/dL — ABNORMAL HIGH (ref 6–23)
CHLORIDE: 101 meq/L (ref 96–112)
CO2: 27 mEq/L (ref 19–32)
Calcium: 8 mg/dL — ABNORMAL LOW (ref 8.4–10.5)
Creatinine, Ser: 1.02 mg/dL (ref 0.50–1.10)
GFR calc Af Amer: 60 mL/min — ABNORMAL LOW (ref >90)
GFR calc non Af Amer: 52 mL/min — ABNORMAL LOW (ref >90)
Glucose, Bld: 77 mg/dL (ref 70–99)
Potassium: 4.3 mEq/L (ref 3.7–5.3)
Sodium: 137 mEq/L (ref 137–147)

## 2013-07-07 LAB — CBC
HEMATOCRIT: 28.5 % — AB (ref 36.0–46.0)
Hemoglobin: 9.1 g/dL — ABNORMAL LOW (ref 12.0–15.0)
MCH: 31 pg (ref 26.0–34.0)
MCHC: 31.9 g/dL (ref 30.0–36.0)
MCV: 96.9 fL (ref 78.0–100.0)
Platelets: 209 10*3/uL (ref 150–400)
RBC: 2.94 MIL/uL — ABNORMAL LOW (ref 3.87–5.11)
RDW: 14.4 % (ref 11.5–15.5)
WBC: 11.4 10*3/uL — AB (ref 4.0–10.5)

## 2013-07-07 LAB — GLUCOSE, CAPILLARY
GLUCOSE-CAPILLARY: 84 mg/dL (ref 70–99)
Glucose-Capillary: 71 mg/dL (ref 70–99)
Glucose-Capillary: 89 mg/dL (ref 70–99)
Glucose-Capillary: 95 mg/dL (ref 70–99)
Glucose-Capillary: 129 mg/dL — ABNORMAL HIGH (ref 70–99)

## 2013-07-07 MED ORDER — ONDANSETRON HCL 4 MG PO TABS: 4.0000 mg | ORAL_TABLET | Freq: Four times a day (QID) | ORAL | Status: DC | PRN

## 2013-07-07 MED ORDER — PANTOPRAZOLE SODIUM 40 MG PO TBEC
40.0000 mg | DELAYED_RELEASE_TABLET | Freq: Every day | ORAL | Status: DC
  Administered 2013-07-08 – 2013-07-09 (×2): 40 mg via ORAL
  Filled 2013-07-07 (×2): qty 1

## 2013-07-07 MED ORDER — SODIUM CHLORIDE 0.9 % IJ SOLN: 3.0000 mL | INTRAMUSCULAR | Status: DC | PRN

## 2013-07-07 MED ORDER — BISACODYL 5 MG PO TBEC: 10.0000 mg | DELAYED_RELEASE_TABLET | Freq: Every day | ORAL | Status: DC | PRN

## 2013-07-07 MED ORDER — ALPRAZOLAM 0.25 MG PO TABS: 0.2500 mg | ORAL_TABLET | Freq: Two times a day (BID) | ORAL | Status: DC | PRN

## 2013-07-07 MED ORDER — TRAMADOL HCL 50 MG PO TABS
50.0000 mg | ORAL_TABLET | ORAL | Status: DC | PRN
  Administered 2013-07-07: 100 mg via ORAL
  Filled 2013-07-07: qty 2

## 2013-07-07 MED ORDER — INSULIN DETEMIR 100 UNIT/ML ~~LOC~~ SOLN
10.0000 [IU] | Freq: Every day | SUBCUTANEOUS | Status: DC
  Administered 2013-07-07: 10 [IU] via SUBCUTANEOUS
  Filled 2013-07-07 (×2): qty 0.1

## 2013-07-07 MED ORDER — INSULIN ASPART 100 UNIT/ML ~~LOC~~ SOLN: 0.0000 [IU] | Freq: Three times a day (TID) | SUBCUTANEOUS | Status: DC

## 2013-07-07 MED ORDER — SODIUM CHLORIDE 0.9 % IV SOLN: 250.0000 mL | INTRAVENOUS | Status: DC | PRN

## 2013-07-07 MED ORDER — DOCUSATE SODIUM 100 MG PO CAPS
200.0000 mg | ORAL_CAPSULE | Freq: Every day | ORAL | Status: DC
  Administered 2013-07-08 – 2013-07-09 (×2): 200 mg via ORAL
  Filled 2013-07-07 (×2): qty 2

## 2013-07-07 MED ORDER — FUROSEMIDE 40 MG PO TABS
40.0000 mg | ORAL_TABLET | Freq: Every day | ORAL | Status: AC
  Administered 2013-07-08 – 2013-07-09 (×2): 40 mg via ORAL
  Filled 2013-07-07 (×2): qty 1

## 2013-07-07 MED ORDER — ONDANSETRON HCL 4 MG/2ML IJ SOLN
4.0000 mg | Freq: Four times a day (QID) | INTRAMUSCULAR | Status: DC | PRN
Start: 2013-07-07 — End: 2013-07-09
  Administered 2013-07-08 – 2013-07-09 (×2): 4 mg via INTRAVENOUS
  Filled 2013-07-07 (×2): qty 2

## 2013-07-07 MED ORDER — FUROSEMIDE 40 MG PO TABS: 40.0000 mg | ORAL_TABLET | Freq: Every day | ORAL | Status: DC

## 2013-07-07 MED ORDER — ACETAMINOPHEN 325 MG PO TABS: 650.0000 mg | ORAL_TABLET | Freq: Four times a day (QID) | ORAL | Status: DC | PRN

## 2013-07-07 MED ORDER — SODIUM CHLORIDE 0.9 % IJ SOLN
3.0000 mL | Freq: Two times a day (BID) | INTRAMUSCULAR | Status: DC
  Administered 2013-07-07 – 2013-07-08 (×3): 3 mL via INTRAVENOUS

## 2013-07-07 MED ORDER — MOVING RIGHT ALONG BOOK
Freq: Once | Status: AC
  Administered 2013-07-07: 11:00:00
  Filled 2013-07-07: qty 1

## 2013-07-07 MED ORDER — POTASSIUM CHLORIDE CRYS ER 20 MEQ PO TBCR
20.0000 meq | EXTENDED_RELEASE_TABLET | Freq: Two times a day (BID) | ORAL | Status: AC
  Administered 2013-07-07 – 2013-07-08 (×4): 20 meq via ORAL
  Filled 2013-07-07 (×5): qty 1

## 2013-07-07 MED ORDER — OXYCODONE HCL 5 MG PO TABS
5.0000 mg | ORAL_TABLET | ORAL | Status: DC | PRN
  Administered 2013-07-08 – 2013-07-09 (×2): 10 mg via ORAL
  Filled 2013-07-07 (×2): qty 2

## 2013-07-07 MED ORDER — BISACODYL 10 MG RE SUPP: 10.0000 mg | Freq: Every day | RECTAL | Status: DC | PRN

## 2013-07-07 NOTE — Progress Notes (Signed)
Pt notified staff of feeling sweaty and "a bit shaky", scheduled CBG preformed revealed result of 71, Pt provided with fruit cup snack and advised of the s/s of hypoglycemia and when to contact staff, Pt states understanding.

## 2013-07-07 NOTE — Progress Notes (Signed)
Upon returning to room to check on pt, pt is resting quietly in bed watching tv, will continue to monitor Rickard Rhymes, RN

## 2013-07-07 NOTE — Progress Notes (Signed)
3 Days Post-Op Procedure(s) (LRB): CORONARY ARTERY BYPASS GRAFTING (CABG) times four using left internal mammary artery and right saphenous leg vein. (N/A) INTRAOPERATIVE TRANSESOPHAGEAL ECHOCARDIOGRAM (N/A) Subjective:  No complaints. Feeling better  Objective: Vital signs in last 24 hours: Temp:  [97.7 F (36.5 C)-98.5 F (36.9 C)] 98.5 F (36.9 C) (05/11 0400) Pulse Rate:  [51-74] 63 (05/11 0700) Cardiac Rhythm:  [-] Normal sinus rhythm (05/10 2000) Resp:  [5-20] 17 (05/11 0700) BP: (88-130)/(51-106) 109/67 mmHg (05/11 0700) SpO2:  [90 %-100 %] 96 % (05/11 0700) Weight:  [74.3 kg (163 lb 12.8 oz)] 74.3 kg (163 lb 12.8 oz) (05/11 0600)  Hemodynamic parameters for last 24 hours:    Intake/Output from previous day: 05/10 0701 - 05/11 0700 In: 1930 [P.O.:1300; I.V.:480; IV Piggyback:150] Out: 2715 [Urine:2675; Chest Tube:40] Intake/Output this shift:    General appearance: alert and cooperative Neurologic: intact Heart: regular rate and rhythm, S1, S2 normal, no murmur, click, rub or gallop Lungs: clear to auscultation bilaterally Extremities: edema mild Wound: incisions ok  Lab Results:  Recent Labs  07/06/13 0402 07/07/13 0330  WBC 14.3* 11.4*  HGB 8.9* 9.1*  HCT 28.0* 28.5*  PLT 190 209   BMET:  Recent Labs  07/06/13 0402 07/07/13 0330  NA 135* 137  K 3.7 4.3  CL 100 101  CO2 26 27  GLUCOSE 87 77  BUN 25* 25*  CREATININE 1.05 1.02  CALCIUM 7.9* 8.0*    PT/INR:  Recent Labs  07/04/13 1300  LABPROT 16.0*  INR 1.31   ABG    Component Value Date/Time   PHART 7.437 07/04/2013 1846   HCO3 26.7* 07/04/2013 1846   TCO2 26 07/05/2013 1708   ACIDBASEDEF 2.0 07/04/2013 1255   O2SAT 97.0 07/04/2013 1846   CBG (last 3)   Recent Labs  07/06/13 2316 07/06/13 2325 07/07/13 0346  GLUCAP 120* 110* 71    Assessment/Plan: S/P Procedure(s) (LRB): CORONARY ARTERY BYPASS GRAFTING (CABG) times four using left internal mammary artery and right saphenous leg  vein. (N/A) INTRAOPERATIVE TRANSESOPHAGEAL ECHOCARDIOGRAM (N/A) Mobilize Diuresis Diabetes control: glucose under good control yesterday. Dropped to 71 this am and was shaky. Will decrease Levemir. Plan for transfer to step-down: see transfer orders Social work consult for SNF placement. She lives alone and does not have any family that can stay with her.   LOS: 7 days    Gaye Pollack 07/07/2013

## 2013-07-07 NOTE — Progress Notes (Addendum)
Clinical Social Work Department CLINICAL SOCIAL WORK PLACEMENT NOTE 07/07/2013  Patient:  Amanda Curtis, Amanda Curtis  Account Number:  [id number] Admit date:  06/30/2013  Clinical Social Worker:  Ky Barban, Latanya Presser  Date/time:  07/07/2013 01:05 PM  Clinical Social Work is seeking post-discharge placement for this patient at the following level of care:   [place]   (*CSW will update this form in Epic as items are completed)   N/A-pt gave CSW permission to send clinicals to all SNFs in [facility]  Patient/family provided with [facility] Department of Clinical Social Work's list of facilities offering this level of care within the geographic area requested by the patient (or if unable, by the patient's family).  07/07/2013  Patient/family informed of their freedom to choose among providers that offer the needed level of care, that participate in Medicare, Medicaid or managed care program needed by the patient, have an available bed and are willing to accept the patient.  N/A-clinicals not sent to this SNF  Patient/family informed of [facility]' ownership interest in [facility], as well as of the fact that they are under no obligation to receive care at this facility.  PASARR submitted to EDS on 07/07/2013 PASARR number received from EDS on 07/07/2013  FL2 transmitted to all facilities in geographic area requested by pt/family on  07/07/2013 FL2 transmitted to all facilities within larger geographic area on   Patient informed that his/her managed care company has contracts with or will negotiate with  certain facilities, including the following:     Patient/family informed of bed offers received:  07/07/2013 Patient chooses bed at  Physician recommends and patient chooses bed at    Patient to be transferred to  on   Patient to be transferred to facility by   The following physician request were entered in Epic:   Additional Comments: CSW provided bed offer's to  pt's grandson [name]. Asked for first and second-choice facilities as soon as he can give these to me. Also provided printed list of bed offers to pt in room.   Ky Barban, MSW, [facility] Clinical Social Worker [phone]

## 2013-07-07 NOTE — Progress Notes (Signed)
CARDIAC REHAB PHASE I   PRE:  Rate/Rhythm: 77 SR  BP:  Supine:   Sitting: 100./60  Standing:    SaO2: 95 RA  MODE:  Ambulation: 460 ft   POST:  Rate/Rhythm: 84  BP:  Supine:   Sitting: 120/60  Standing:    SaO2: 94 RA 1335-1415 Assisted X 1 and used walker to ambulate. Gait steady with walker. Pt able to walk 460 feet. VS stable Pt to recliner after walk with call light in reach. Pt c/o of no lunch, her nurse has call twice for it.  Rodney Langton RN 07/07/2013 2:17 PM

## 2013-07-07 NOTE — Progress Notes (Signed)
Pt received into room 2w35, pt sitting in bed with call bell in reach, placed on tele, pt oriented to room and call bell, pt states no complaints at this time, will continue to monitor Rickard Rhymes, RN

## 2013-07-07 NOTE — Progress Notes (Signed)
Pt transferred to 2W35 via wheelchair. Pt tolerated transfer without difficulty. VSS. Placed in bed and on telemetry. Pt's grandson [name] notified of transfer. Pt's purse, glasses, cell phone and charger all transferred with pt. No complaints per pt. Bedside handoff with Minette Brine, RN.  Dellie Catholic, RN

## 2013-07-07 NOTE — Progress Notes (Signed)
Pt states she feels "hot and nauseas" pt assisted back to bed, pt refused anti-nausea medicine, ice water and gingerale given, call bell within reach, will continue to monitor pt Rickard Rhymes, RN

## 2013-07-07 NOTE — Progress Notes (Signed)
Clinical Social Work Department BRIEF PSYCHOSOCIAL ASSESSMENT 07/07/2013  Patient:  Amanda Curtis, Amanda Curtis     Account Number:  [id number]     Admit date:  06/30/2013  Clinical Social Worker:  Freeman Caldron  Date/Time:  07/07/2013 12:59 PM  Referred by:  Physician  Date Referred:  07/07/2013 Referred for  SNF Placement   Other Referral:   Interview type:  Patient Other interview type:   Also spoke with pt's grandson [name], at pt request    PSYCHOSOCIAL DATA Living Status:  ALONE Admitted from facility:   Level of care:   Primary support name:  Ebony Hail [phone]) Primary support relationship to patient:  FAMILY Degree of support available:   Good--pt states her son provides support, and she has 3 grandsons the live in the area and are supportive. Pt's grandson Matthew Saras is pt's primary decision-maker, per pt.    CURRENT CONCERNS Current Concerns  Post-Acute Placement   Other Concerns:    SOCIAL WORK ASSESSMENT / PLAN CSW explained recommendation for SNF, and pt agrees. Explained referral process and pt has given CSW permission to send clinicals to all SNFs in [facility]. Pt lives in [place], but grandson (primary support) lives in [place]. Pt asked CSW to call grandson to update him, and CSW did this.   Assessment/plan status:  Psychosocial Support/Ongoing Assessment of Needs Other assessment/ plan:   Information/referral to community resources:   SNF    PATIENT'S/FAMILY'S RESPONSE TO PLAN OF CARE: Good--pt and grandson participated in conversation with CSW. Pt understanding of recommendation for SNF, and grandson agreed as well. CSW to update both pt and grandson when bed offers are made.       Ky Barban, MSW, [facility] Clinical Social Worker [phone]

## 2013-07-08 LAB — GLUCOSE, CAPILLARY
GLUCOSE-CAPILLARY: 96 mg/dL (ref 70–99)
GLUCOSE-CAPILLARY: 111 mg/dL — AB (ref 70–99)
Glucose-Capillary: 106 mg/dL — ABNORMAL HIGH (ref 70–99)
Glucose-Capillary: 130 mg/dL — ABNORMAL HIGH (ref 70–99)

## 2013-07-08 MED ORDER — LINAGLIPTIN 5 MG PO TABS
5.0000 mg | ORAL_TABLET | Freq: Every day | ORAL | Status: DC
  Administered 2013-07-08 – 2013-07-09 (×2): 5 mg via ORAL
  Filled 2013-07-08 (×2): qty 1

## 2013-07-08 MED ORDER — LOSARTAN POTASSIUM 50 MG PO TABS
50.0000 mg | ORAL_TABLET | Freq: Every day | ORAL | Status: DC
  Administered 2013-07-08 – 2013-07-09 (×2): 50 mg via ORAL
  Filled 2013-07-08 (×2): qty 1

## 2013-07-08 MED ORDER — DIPHENHYDRAMINE-ZINC ACETATE 2-0.1 % EX CREA
TOPICAL_CREAM | Freq: Two times a day (BID) | CUTANEOUS | Status: DC | PRN
  Administered 2013-07-08: via TOPICAL
  Filled 2013-07-08: qty 28

## 2013-07-08 MED FILL — Magnesium Sulfate Inj 50%: INTRAMUSCULAR | Qty: 10 | Status: AC

## 2013-07-08 MED FILL — Heparin Sodium (Porcine) Inj 1000 Unit/ML: INTRAMUSCULAR | Qty: 30 | Status: AC

## 2013-07-08 MED FILL — Potassium Chloride Inj 2 mEq/ML: INTRAVENOUS | Qty: 40 | Status: AC

## 2013-07-08 NOTE — Progress Notes (Signed)
CARDIAC REHAB PHASE I   PRE:  Rate/Rhythm: 74 SR  BP:  Supine:   Sitting: 104/60  Standing:    SaO2: 92 RA  MODE:  Ambulation: 550 ft   POST:  Rate/Rhythm: 82  BP:  Supine:   Sitting: 104/72  Standing:    SaO2: 94 RA [id number] Assisted X 1 and used walker to ambulate. Gait staedy with walker. Pt able to walk 550 feet without c/o. VS stable Pt to recliner after walk with call light in reach. I encouraged use of IS and two more walks today.  Rodney Langton RN 07/08/2013 10:20 AM

## 2013-07-08 NOTE — Progress Notes (Signed)
CSW left voicemail for patient's grandson to discuss bed offers. CSW awaiting phone call.  Jeanette Caprice, MSW, [place]

## 2013-07-08 NOTE — Progress Notes (Signed)
CSW spoke to patient and explained that Physical Therapy has not been ordered yet and evaluated patient to recommend a SNF. CSW explained that PT would need to make a recommendation of SNF before patient can go to one. CSW explained that insurance would not cover SNF, unless patient has a skilled need. Patient verbalized understanding and asked social worker to call grandson. CSW then called grandson to explain that social worker is waiting on a PT/OT eval to see what they recommend and provided patient's grandson with all of his options, pending PT eval. Patient's grandson verbalized his understanding. CSW will follow up with patient and patient's grandson after PT eval.  Jeanette Caprice, MSW, [place]

## 2013-07-08 NOTE — Progress Notes (Addendum)
       [address]       [place] [address]             [phone]          4 Days Post-Op Procedure(s) (LRB): CORONARY ARTERY BYPASS GRAFTING (CABG) times four using left internal mammary artery and right saphenous leg vein. (N/A) INTRAOPERATIVE TRANSESOPHAGEAL ECHOCARDIOGRAM (N/A)  Subjective: Feeling very nauseated and shaky this am.  Blood sugars low. Just received Zofran.    Objective: Vital signs in last 24 hours: Patient Vitals for the past 24 hrs:  BP Temp Temp src Pulse Resp SpO2 Weight  07/08/13 0647 148/90 mmHg 98 F (36.7 C) Oral 76 18 91 % 160 lb 0.9 oz (72.6 kg)  07/07/13 2035 134/77 mmHg 97.4 F (36.3 C) Oral 75 18 96 % -  07/07/13 1441 106/61 mmHg 98 F (36.7 C) Oral 74 18 94 % -  07/07/13 1016 134/83 mmHg 98 F (36.7 C) Oral 68 18 93 % -  07/07/13 1000 - - - - 24 - -  07/07/13 0900 117/70 mmHg - - 70 18 97 % -  07/07/13 0832 - 97.9 F (36.6 C) Oral - - - -   Current Weight  07/08/13 160 lb 0.9 oz (72.6 kg)  PRE-OPERATIVE WEIGHT: 71 kg    Intake/Output from previous day: 05/11 0701 - 05/12 0700 In: 860 [P.O.:840; I.V.:20] Out: 1250 [Urine:1250]  CBGs 124-84-96   PHYSICAL EXAM:  Heart: RRR Lungs: Clear Wound: Clean and dry Extremities: Mild LE edema    Lab Results: CBC: Recent Labs  07/06/13 0402 07/07/13 0330  WBC 14.3* 11.4*  HGB 8.9* 9.1*  HCT 28.0* 28.5*  PLT 190 209   BMET:  Recent Labs  07/06/13 0402 07/07/13 0330  NA 135* 137  K 3.7 4.3  CL 100 101  CO2 26 27  GLUCOSE 87 77  BUN 25* 25*  CREATININE 1.05 1.02  CALCIUM 7.9* 8.0*    PT/INR: No results found for this basename: LABPROT, INR,  in the last 72 hours    Assessment/Plan: S/P Procedure(s) (LRB): CORONARY ARTERY BYPASS GRAFTING (CABG) times four using left internal mammary artery and right saphenous leg vein. (N/A) INTRAOPERATIVE TRANSESOPHAGEAL ECHOCARDIOGRAM (N/A)  CV- BPs trending up.  Beta blocker stopped due to bradycardia.   Will resume low dose ARB.  DM- sugars low and pt refusing Levemir.  Will d/c and restart Tradjenta.  Vol overload- diurese.  CRPI, pulm toilet.  Disp- CM working on SNF placement.  Hopefully ready for d/c soon.   LOS: 8 days    Coolidge Breeze 07/08/2013   Chart reviewed, patient examined, agree with above.

## 2013-07-08 NOTE — Evaluation (Signed)
Physical Therapy Evaluation Patient Details Name: Amanda Curtis MRN: [id number] DOB: 1935/04/07 Today's Date: 07/08/2013   History of Present Illness  Patient is a 78 y/o female admitted with chest pain now s/o CABG x 4 on 07/04/13.  Clinical Impression  Patient presents with decreased independence with mobility due to deficits listed below in PT problem list.  She will benefit from skilled PT in the acute setting to allow return home independent following SNF stay.    Follow Up Recommendations SNF;Supervision/Assistance - 24 hour    Equipment Recommendations  Rolling walker with 5" wheels    Recommendations for Other Services       Precautions / Restrictions Precautions Precautions: Sternal;Fall  Educated in precautions with mobility      Mobility  Bed Mobility Overal bed mobility: Needs Assistance Bed Mobility: Supine to Sit;Sit to Sidelying     Supine to sit: Mod assist;HOB elevated   Sit to sidelying: Mod assist General bed mobility comments: increased assist to lift trunk and guide legs off bed to sit pt hugging pillow; to supine with assist through sidelying flat HOB and cues hugging pillow and assist for bed positioning with cues and increased time  Transfers Overall transfer level: Needs assistance   Transfers: Sit to/from Stand Sit to Stand: Min assist         General transfer comment: lifting from bed  Ambulation/Gait Ambulation/Gait assistance: Min guard;Supervision Ambulation Distance (Feet): 300 Feet Assistive device: Rolling walker (2 wheeled) Gait Pattern/deviations: Step-through pattern;Decreased stride length     General Gait Details: cues for safety on turns  Stairs            Wheelchair Mobility    Modified Rankin (Stroke Patients Only)       Balance Overall balance assessment: Needs assistance         Standing balance support: No upper extremity supported Standing balance-Leahy Scale: Fair Standing balance comment:  static stand without UE support, needs UE assist for ambulation                             Pertinent Vitals/Pain 8/10 in chest, RN aware    Home Living Family/patient expects to be discharged to:: Private residence Living Arrangements: Alone   Type of Home: House (townhouse) Home Access: Stairs to enter Entrance Stairs-Rails: None Technical brewer of Steps: 1 Home Layout: One level Home Equipment: None      Prior Function Level of Independence: Independent               Hand Dominance        Extremity/Trunk Assessment   Upper Extremity Assessment: RUE deficits/detail;LUE deficits/detail RUE Deficits / Details: AROM at least to 90 strength NT due to sternal incision   RUE Sensation: history of peripheral neuropathy LUE Deficits / Details: AROM at least to 90 strength NT due to sternal incision   Lower Extremity Assessment: RLE deficits/detail;LLE deficits/detail RLE Deficits / Details: AROM grossly WFL with incisions medial thigh and distal lower leg, hip flexion strength 3-/5, knee extension 4-/5 LLE Deficits / Details: AROM WFL, strength hip flexion 4/5. knee extension 4+/5     Communication   Communication: No difficulties  Cognition Arousal/Alertness: Awake/alert Behavior During Therapy: WFL for tasks assessed/performed Overall Cognitive Status: Within Functional Limits for tasks assessed                      General Comments  Exercises        Assessment/Plan    PT Assessment Patient needs continued PT services  PT Diagnosis Abnormality of gait;Generalized weakness;Acute pain   PT Problem List Decreased strength;Decreased activity tolerance;Decreased mobility;Decreased knowledge of precautions;Pain  PT Treatment Interventions DME instruction;Gait training;Functional mobility training;Therapeutic activities;Therapeutic exercise;Patient/family education;Balance training   PT Goals (Current goals can be found in the  Care Plan section) Acute Rehab PT Goals Patient Stated Goal: To get back to normal PT Goal Formulation: With patient Time For Goal Achievement: 07/22/13 Potential to Achieve Goals: Good    Frequency Min 3X/week   Barriers to discharge        Co-evaluation               End of Session Equipment Utilized During Treatment: Gait belt Activity Tolerance: Patient limited by pain Patient left: in bed;with call bell/phone within reach Nurse Communication: Patient requests pain meds         Time: 1255-1335 PT Time Calculation (min): 40 min   Charges:   PT Evaluation $Initial PT Evaluation Tier I: 1 Procedure PT Treatments $Gait Training: 8-22 mins $Therapeutic Activity: 8-22 mins   PT G Codes:          Max Sane 07/08/2013, 1:40 PM Magda Kiel, [place] 07/08/2013

## 2013-07-09 LAB — GLUCOSE, CAPILLARY
Glucose-Capillary: 129 mg/dL — ABNORMAL HIGH (ref 70–99)
Glucose-Capillary: 87 mg/dL (ref 70–99)

## 2013-07-09 MED ORDER — TRAMADOL HCL 50 MG PO TABS: 50.0000 mg | ORAL_TABLET | ORAL | Status: DC | PRN

## 2013-07-09 MED ORDER — MAGNESIUM HYDROXIDE 400 MG/5ML PO SUSP
15.0000 mL | Freq: Every day | ORAL | Status: AC | PRN
  Administered 2013-07-09: 15 mL via ORAL
  Filled 2013-07-09: qty 30

## 2013-07-09 MED ORDER — ASPIRIN EC 325 MG PO TBEC
325.0000 mg | DELAYED_RELEASE_TABLET | Freq: Every day | ORAL | Status: DC
Start: 2013-07-09 — End: 2013-07-09
  Administered 2013-07-09: 325 mg via ORAL
  Filled 2013-07-09: qty 1

## 2013-07-09 MED ORDER — ACETAMINOPHEN 325 MG PO TABS: 650.0000 mg | ORAL_TABLET | Freq: Four times a day (QID) | ORAL | Status: DC | PRN

## 2013-07-09 MED ORDER — LOSARTAN POTASSIUM 50 MG PO TABS: 50.0000 mg | ORAL_TABLET | Freq: Every day | ORAL | Status: DC

## 2013-07-09 NOTE — Progress Notes (Signed)
Pt being discharged to [facility] via EMS. Discharge instruction reviewed with pt. Pt vu. Pt belongings sent with pt.

## 2013-07-09 NOTE — Progress Notes (Addendum)
Central line taken out per order and protocol using sterile technique. Catheter intact. Pressure held for 5 minutes and occlusive dressing applied applied. Patient tolerated well, verbalized understanding of bedrest x30 minutes. Will continue to monitor until discharge.   Clovis Riley, RN

## 2013-07-09 NOTE — Discharge Summary (Signed)
[address]       [place] [address]             [phone]              Discharge Summary  Name: Amanda Curtis DOB: 1935/11/18 78 y.o. MRN: [id number]   Admission Date: 06/30/2013 Discharge Date: 07/09/2013    Admitting Diagnosis: Chest pain   Discharge Diagnosis:  Severe three vessel coronary artery disease Unstable angina Expected postoperative blood loss anemia Postoperative bradycardia  Past Medical History  Diagnosis Date  . Hypertension   . Arthritis   . Graves disease 2000    s/p RIA  . Diabetes mellitus without complication   . Insomnia       Procedures: CORONARY ARTERY BYPASS GRAFTING x 4 (Left internal mammary artery to left anterior descending, saphenous vein graft to diagonal, saphenous vein graft to obtuse marginal, saphenous vein graft to posterior descending) ENDOSCOPIC VEIN HARVEST RIGHT LEG - 07/04/2013    HPI:  The patient is a 78 y.o. female witha history of diabetes, hypertension, hypercholesterolemia, and a family history of premature coronary artery disease. She reports having chest discomfort, fatigue, dizziness and generalized weakness over the past year that has been worsening. She moved to [place] from [place] about 2 years ago and says she had a previous stress test in [place] that was normal. She recently underwent a Myoview stress test here that was an intermediate risk study showing a moderate area of ischemia in the inferolateral and anterolateral walls. She was scheduled for a cath at that time,  but did not show up because she was scared and requested a second opinion from Dr. Wyline Copas in [facility]. She had a cardiac CT which showed a high calcium score and evidence of severe 3-vessel CAD. She eventually underwent cardiac catheterization on the date of admission, which showed severe 3-vessel CAD.  She was admitted following cath for further work up.     Hospital Course:  The patient was admitted to [facility] on 06/30/2013. A cardiac surgery consult was requested and Dr. Cyndia Bent saw the patient.  He recommended proceeding with surgical revascularization at this time. All risks, benefits and alternatives of surgery were explained in detail, and the patient agreed to proceed. The patient was taken to the operating room and underwent the above procedure.    The postoperative course was initially noted for bradycardia, necessitating discontinuation of her beta blocker.  She was noted to be volume overloaded, and was started on Lasix, to which she responded well.  Her blood pressures have started to trend upward, and she was started on a low dose of Losartan.  She otherwise is progressing well.  She is ambulating in the halls with cardiac rehab and physical therapy. Incisions are healing well.  She has remained in sinus rhythm and has had no further bradycardia. Blood sugars are controlled on home medications (A1C=6.4).  She will need a short term skilled nursing placement for further rehab prior to discharge home.  She is currently medically stable for discharge once a bed is available.     Recent vital signs:  Filed Vitals:   07/08/13 2059  BP: 120/66  Pulse: 69  Temp: 98.2 F (36.8 C)  Resp: 18    Recent laboratory studies:  CBC: Recent Labs  07/07/13 0330  WBC 11.4*  HGB 9.1*  HCT 28.5*  PLT 209   BMET:  Recent Labs  07/07/13 0330  NA  137  K 4.3  CL 101  CO2 27  GLUCOSE 77  BUN 25*  CREATININE 1.02  CALCIUM 8.0*    PT/INR: No results found for this basename: LABPROT, INR,  in the last 72 hours   Discharge Medications:     Medication List    STOP taking these medications       hydrochlorothiazide 25 MG tablet  Commonly known as:  HYDRODIURIL     neomycin-bacitracin-polymyxin ointment  Commonly known as:  NEOSPORIN     nitroGLYCERIN 0.4 MG SL tablet  Commonly known as:  NITROSTAT      TAKE these medications       acetaminophen 325 MG tablet  Commonly known as:   TYLENOL  Take 2 tablets (650 mg total) by mouth every 6 (six) hours as needed for mild pain.     aspirin EC 81 MG tablet  Take 81 mg by mouth daily.     FLONASE NA  Place 1 spray into the nose daily as needed (for allergies).     levothyroxine 100 MCG tablet  Commonly known as:  SYNTHROID, LEVOTHROID  Take 100 mcg by mouth daily before breakfast.     linagliptin 5 MG Tabs tablet  Commonly known as:  TRADJENTA  Take 5 mg by mouth daily.     losartan 50 MG tablet  Commonly known as:  COZAAR  Take 1 tablet (50 mg total) by mouth daily.     pravastatin 40 MG tablet  Commonly known as:  PRAVACHOL  Take 1 tablet (40 mg total) by mouth every evening.     traMADol 50 MG tablet  Commonly known as:  ULTRAM  Take 1-2 tablets (50-100 mg total) by mouth every 4 (four) hours as needed for moderate pain.         Discharge Instructions:     1. Please obtain vital signs at least one time daily 2. Please weigh the patient daily. If he or she continues to gain weight or develops lower extremity edema, contact the office at [phone]. 3. Ambulate patient at least three times daily and please use sternal precautions. 4. The patient is to refrain from driving, heavy lifting or strenuous activity.  5. May shower daily and clean incisions with soap and water.  6. Continue carb modified diet.    Follow Up:  Follow-up Information   Follow up with Darden Amber., MD.   Specialty:  Cardiology   Contact information:   [address] [place] [address] [phone]       Follow up with Gaye Pollack, MD On 08/06/2013. (Have a chest x-ray at [place] [facility] at 9:30 , then see MD at 10:30)    Specialty:  Cardiothoracic Surgery   Contact information:   [address] [place] [address] [phone]       The patient has been discharged on:  1.Beta Blocker: Yes [  ]  No [ x ]  If No, reason: Postoperative bradycardia   2.Ace  Inhibitor/ARB: Yes [ x ]  No [  ]  If No, reason:    3.Statin: Yes [ x ]  No [ ]   If No, reason:    4.Ecasa: Yes [ x ]  No [ ]   If No, reason:     Coolidge Breeze 07/09/2013, 9:34 AM

## 2013-07-09 NOTE — Progress Notes (Signed)
Covering CSW spoke with pt grandson who would like placement at [facility] for pt. CSW confirmed bed availability with Ivin Booty at [facility] and was informed that the facility will need the dc summary by 2:45pm today in order to accept pt today. CSW will assist with dc once pt dc paperwork completed and FL2 signed. RN made aware.  Hunt Oris, MSW, [place]

## 2013-07-09 NOTE — Progress Notes (Signed)
Epicardial wires removed per protocol, intact upon removal. Patient tolerated procedure. Advised and verbalized understanding of bedrest x 1 hr. Vital signs taken q15 minutes per protocol. Will continue to monitor patient.  Clovis Riley, RN

## 2013-07-09 NOTE — Progress Notes (Addendum)
       [address]       ,[place] [address]             [phone]          5 Days Post-Op Procedure(s) (LRB): CORONARY ARTERY BYPASS GRAFTING (CABG) times four using left internal mammary artery and right saphenous leg vein. (N/A) INTRAOPERATIVE TRANSESOPHAGEAL ECHOCARDIOGRAM (N/A)  Subjective: Feels better overall today.  Only complaint is nausea related to Oxycodone.  Otherwise stable.   Objective: Vital signs in last 24 hours: Patient Vitals for the past 24 hrs:  BP Temp Temp src Pulse Resp SpO2 Weight  07/09/13 0213 - - - - - - 160 lb 0.9 oz (72.6 kg)  07/08/13 2059 120/66 mmHg 98.2 F (36.8 C) Oral 69 18 95 % -  07/08/13 1339 94/57 mmHg 98.2 F (36.8 C) Oral 80 20 100 % -   Current Weight  07/09/13 160 lb 0.9 oz (72.6 kg)  PRE-OPERATIVE WEIGHT: 71 kg    Intake/Output from previous day: 05/12 0701 - 05/13 0700 In: 600 [P.O.:600] Out: -   CBGs 130-111-129   PHYSICAL EXAM:  Heart: RRR Lungs: Clear Wound:Clean and dry Extremities: Mild RLE edema    Lab Results: CBC: Recent Labs  07/07/13 0330  WBC 11.4*  HGB 9.1*  HCT 28.5*  PLT 209   BMET:  Recent Labs  07/07/13 0330  NA 137  K 4.3  CL 101  CO2 27  GLUCOSE 77  BUN 25*  CREATININE 1.02  CALCIUM 8.0*    PT/INR: No results found for this basename: LABPROT, INR,  in the last 72 hours    Assessment/Plan: S/P Procedure(s) (LRB): CORONARY ARTERY BYPASS GRAFTING (CABG) times four using left internal mammary artery and right saphenous leg vein. (N/A) INTRAOPERATIVE TRANSESOPHAGEAL ECHOCARDIOGRAM (N/A)  CV- BPs stable, low dose ARB resumed. Beta blocker stopped due to bradycardia.   DM- sugars improved, continue home dose of Tradjenta.   Vol overload- diurese.   CRPI, pulm toilet.   Disp- SNF when bed available.   LOS: 9 days    Coolidge Breeze 07/09/2013   Chart reviewed, patient examined, agree with above. FL-2 signed. She is medically ready to go when  bed available.

## 2013-07-09 NOTE — Progress Notes (Signed)
CARDIAC REHAB PHASE I   PRE:  Rate/Rhythm: 72 SR  BP:  Supine:   Sitting: 100/60  Standing:    SaO2: 97 RA  MODE:  Ambulation: 850 ft   POST:  Rate/Rhythm: 60  BP:  Supine:   Sitting: 134/70  Standing:    SaO2: 96 RA 0940-1055 Assisted X 1 and used walker to ambulate. Gait steady with walker. No c/o with walking. VS stable Pt back to bed after walk with call light in reach, to have pacing wires discontinued. Completed discharge education with pt. She voices understanding. Pt agrees to [facility]. CRP in [place], will send referral.  Rodney Langton RN 07/09/2013 10:57 AM

## 2013-07-09 NOTE — Evaluation (Signed)
Occupational Therapy Evaluation Patient Details Name: Amanda Curtis MRN: [id number] DOB: Dec 04, 1935 Today's Date: 07/09/2013    History of Present Illness Patient is a 78 y/o female admitted with chest pain now s/o CABG x 4 on 07/04/13.   Clinical Impression   Patient is s/p CABG x4 surgery resulting in functional limitations due to the deficits listed below (see OT problem list).  Patient will benefit from skilled OT acutely to increase independence and safety with ADLS to allow discharge SNF. Pt reports grandson is main source of support and has no other family to assist upon d/c.     Follow Up Recommendations  SNF    Equipment Recommendations  Other (comment) (defer SNF)    Recommendations for Other Services       Precautions / Restrictions Precautions Precautions: Sternal;Fall      Mobility Bed Mobility Overal bed mobility: Needs Assistance Bed Mobility: Sit to Supine       Sit to supine: Mod assist;HOB elevated      Transfers Overall transfer level: Needs assistance   Transfers: Sit to/from Stand Sit to Stand: Min assist         General transfer comment: cues to push with BIL LE and avoid BIL UE use    Balance Overall balance assessment: Needs assistance Sitting-balance support: No upper extremity supported;Feet supported Sitting balance-Leahy Scale: Good     Standing balance support: No upper extremity supported;During functional activity Standing balance-Leahy Scale: Fair                              ADL Overall ADL's : Needs assistance/impaired Eating/Feeding: Set up;Sitting   Grooming: Set up;Sitting       Lower Body Bathing: Maximal assistance;Sit to/from stand Lower Body Bathing Details (indicate cue type and reason): inability to cross bil LE     Lower Body Dressing: Maximal assistance;Sit to/from stand   Toilet Transfer: Min guard;RW             General ADL Comments: Pt currently experiencing nausea. Pt educated  on EC techniques and advised to adopted two new methods this week to practice. Pt to read handout in more depth after session. Pt verbalized sternal precautions. pt repositioned at end of session supine.     Vision                     Perception     Praxis      Pertinent Vitals/Pain VSS     Hand Dominance Right   Extremity/Trunk Assessment Upper Extremity Assessment Upper Extremity Assessment: Overall WFL for tasks assessed (did not assess past 90 degrees due to sternal precautions) RUE Sensation: history of peripheral neuropathy   Lower Extremity Assessment Lower Extremity Assessment: Defer to PT evaluation   Cervical / Trunk Assessment Cervical / Trunk Assessment: Normal   Communication Communication Communication: No difficulties   Cognition Arousal/Alertness: Awake/alert Behavior During Therapy: WFL for tasks assessed/performed Overall Cognitive Status: Within Functional Limits for tasks assessed                     General Comments       Exercises       Shoulder Instructions      Home Living Family/patient expects to be discharged to:: Skilled nursing facility Living Arrangements: Alone   Type of Home: House Home Access: Stairs to enter Entrance Stairs-Number of Steps: 1 Entrance Stairs-Rails: None Home Layout: One  level     Bathroom Shower/Tub: Teacher, early years/pre: Standard     Home Equipment: None          Prior Functioning/Environment Level of Independence: Independent             OT Diagnosis: Generalized weakness;Acute pain   OT Problem List: Decreased strength;Impaired balance (sitting and/or standing);Decreased activity tolerance;Decreased safety awareness;Decreased knowledge of use of DME or AE;Decreased knowledge of precautions;Pain;Impaired UE functional use (sternal precautions limit BIL UE use)   OT Treatment/Interventions: Self-care/ADL training;Therapeutic exercise;DME and/or AE  instruction;Therapeutic activities;Patient/family education;Balance training    OT Goals(Current goals can be found in the care plan section) Acute Rehab OT Goals Patient Stated Goal: to get back to gardening and to see her grandsons (x3) OT Goal Formulation: With patient Time For Goal Achievement: 07/23/13 Potential to Achieve Goals: Good  OT Frequency: Min 2X/week   Barriers to D/C:            Co-evaluation              End of Session Nurse Communication: Mobility status;Precautions  Activity Tolerance: Patient tolerated treatment well Patient left: in bed;with call bell/phone within reach   Time: 0272-5366 OT Time Calculation (min): 16 min Charges:  OT General Charges $OT Visit: 1 Procedure OT Evaluation $Initial OT Evaluation Tier I: 1 Procedure OT Treatments $Self Care/Home Management : 8-22 mins G-Codes:    Peri Maris 14-Jul-2013, 9:25 AM Pager: [phone]

## 2013-07-10 ENCOUNTER — Non-Acute Institutional Stay (SKILLED_NURSING_FACILITY): Payer: Medicare Other | Admitting: Internal Medicine

## 2013-07-10 DIAGNOSIS — I2510 Atherosclerotic heart disease of native coronary artery without angina pectoris: Secondary | ICD-10-CM

## 2013-07-10 DIAGNOSIS — E1159 Type 2 diabetes mellitus with other circulatory complications: Secondary | ICD-10-CM

## 2013-07-10 DIAGNOSIS — K5900 Constipation, unspecified: Secondary | ICD-10-CM

## 2013-07-10 DIAGNOSIS — I150 Renovascular hypertension: Secondary | ICD-10-CM

## 2013-07-10 LAB — SPIROMETRY WITH GRAPH
FEF 25-75 PRE: 2.53 L/s
FEF2575-%PRED-PRE: 147 %
FEV1-%PRED-PRE: 103 %
FEV1-PRE: 2.34 L
FEV1FVC-%Pred-Pre: 108 %
FEV6-%Pred-Pre: 99 %
FEV6-Pre: 2.85 L
FEV6FVC-%Pred-Pre: 105 %
FVC-%Pred-Pre: 95 %
FVC-PRE: 2.88 L
Pre FEV1/FVC ratio: 81 %
Pre FEV6/FVC Ratio: 100 %

## 2013-07-11 ENCOUNTER — Other Ambulatory Visit: Payer: Self-pay | Admitting: *Deleted

## 2013-07-11 MED ORDER — HYDROMORPHONE HCL 2 MG PO TABS: ORAL_TABLET | ORAL | Status: DC

## 2013-07-11 NOTE — Telephone Encounter (Signed)
[facility] 

## 2013-07-12 DIAGNOSIS — K5900 Constipation, unspecified: Secondary | ICD-10-CM | POA: Insufficient documentation

## 2013-07-12 DIAGNOSIS — E1142 Type 2 diabetes mellitus with diabetic polyneuropathy: Secondary | ICD-10-CM | POA: Insufficient documentation

## 2013-07-12 DIAGNOSIS — E1151 Type 2 diabetes mellitus with diabetic peripheral angiopathy without gangrene: Secondary | ICD-10-CM | POA: Insufficient documentation

## 2013-07-12 NOTE — Progress Notes (Signed)
HISTORY & PHYSICAL  DATE: 07/10/2013   FACILITY: [facility]  LEVEL OF CARE: SNF (31)  ALLERGIES:  Allergies  Allergen Reactions  . Atorvastatin Other (See Comments)    Severe muscle cramping  . Elavil [Amitriptyline] Other (See Comments)    Tongue swelling  . Hytrin [Terazosin] Other (See Comments)    syncope  . Erythromycin Rash    CHIEF COMPLAINT:  Manage CAD, diabetes mellitus and hypertension  HISTORY OF PRESENT ILLNESS: 78 year old Caucasian female is admitted to this facility for short-term rehabilitation after recent CABG.  CAD: The angina has been stable. The patient denies dyspnea on exertion, orthopnea, pedal edema, palpitations and paroxysmal nocturnal dyspnea. No complications noted from the medication presently being used. She had severe three-vessel CAD and status post CABG x4 and tolerated the procedure well.  DM:pt's DM remains stable.  Pt denies polyuria, polydipsia, polyphagia, changes in vision or hypoglycemic episodes.  No complications noted from the medication presently being used.  Last hemoglobin A1c is: 6.4.  HTN: Pt 's HTN remains stable.  Denies CP, sob, DOE, pedal edema, headaches, dizziness or visual disturbances.  No complications from the medications currently being used.  Last BP : 109/64.  PAST MEDICAL HISTORY :  Past Medical History  Diagnosis Date  . Hypertension   . Arthritis   . Graves disease 2000    s/p RIA  . Diabetes mellitus without complication   . Insomnia     PAST SURGICAL HISTORY: Past Surgical History  Procedure Laterality Date  . Appendectomy    . Nasal septum surgery      childhood  . Coronary artery bypass graft N/A 07/04/2013    Procedure: CORONARY ARTERY BYPASS GRAFTING (CABG) times four using left internal mammary artery and right saphenous leg vein.;  Surgeon: Gaye Pollack, MD;  Location: [facility] OR;  Service: Open Heart Surgery;  Laterality: N/A;  . Intraoperative transesophageal  echocardiogram N/A 07/04/2013    Procedure: INTRAOPERATIVE TRANSESOPHAGEAL ECHOCARDIOGRAM;  Surgeon: Gaye Pollack, MD;  Location: [facility] OR;  Service: Open Heart Surgery;  Laterality: N/A;    SOCIAL HISTORY:  reports that she has never smoked. She has never used smokeless tobacco. She reports that she drinks alcohol. She reports that she does not use illicit drugs.  FAMILY HISTORY:  Family History  Problem Relation Age of Onset  . Dementia Mother   . Cancer Mother     breast  . Diabetes Father   . Diabetes Paternal Grandmother   . Hypertension Paternal Grandmother     CURRENT MEDICATIONS: Reviewed per MAR/see medication list  REVIEW OF SYSTEMS:  GI: Complains of nausea but no vomiting, constipation ,  NEURO: Dizziness with spinning sensation ,See HPI otherwise 14 point ROS is negative.  PHYSICAL EXAMINATION  VS:  See VS section  GENERAL: no acute distress, moderately obese body habitus EYES: conjunctivae normal, sclerae normal, normal eye lids MOUTH/THROAT: lips without lesions,no lesions in the mouth,tongue is without lesions,uvula elevates in midline NECK: supple, trachea midline, no neck masses, no thyroid tenderness, no thyromegaly LYMPHATICS: no LAN in the neck, no supraclavicular LAN RESPIRATORY: breathing is even & unlabored, BS CTAB CARDIAC: RRR, no murmur,no extra heart sounds, no edema GI:  ABDOMEN: abdomen soft, normal BS, no masses, no tenderness  LIVER/SPLEEN: no hepatomegaly, no splenomegaly MUSCULOSKELETAL: HEAD: normal to inspection & palpation BACK: no kyphosis, scoliosis or spinal processes tenderness EXTREMITIES: LEFT UPPER EXTREMITY: range of motion not allowed to perform, normal  strength & tone RIGHT UPPER EXTREMITY:  range of motion not allowed to perform, normal strength & tone LEFT LOWER EXTREMITY:  Minimal range of motion, normal strength & tone RIGHT LOWER EXTREMITY:  Manimal range of motion, normal strength & tone PSYCHIATRIC: the patient is alert &  oriented to person, affect & behavior appropriate  LABS/RADIOLOGY:  Labs reviewed: Basic Metabolic Panel:  Recent Labs  07/04/13 1800 07/05/13 0400 07/05/13 1700 07/05/13 1708 07/06/13 0402 07/07/13 0330  NA  --  141  --  137 135* 137  K  --  4.5  --  4.3 3.7 4.3  CL  --  106  --  98 100 101  CO2  --  24  --   --  26 27  GLUCOSE  --  106*  --  117* 87 77  BUN  --  22  --  26* 25* 25*  CREATININE 0.88 0.94 1.17* 1.30* 1.05 1.02  CALCIUM  --  7.9*  --   --  7.9* 8.0*  MG 3.8* 2.8* 2.5  --   --   --    Liver Function Tests:  Recent Labs  02/07/13 1146 07/03/13 1650  AST 15 22  ALT 12 15  ALKPHOS 83 84  BILITOT 0.4 0.2*  PROT 7.5 7.7  ALBUMIN 4.3 3.9   CBC:  Recent Labs  03/28/13 1618 04/09/13 1437 06/26/13 1006  07/05/13 1700 07/05/13 1708 07/06/13 0402 07/07/13 0330  WBC 11.3* 11.8* 8.7  < > 20.0*  --  14.3* 11.4*  NEUTROABS 7.6 8.4* 6.1  --   --   --   --   --   HGB 14.6 14.9 14.3  < > 10.4* 11.2* 8.9* 9.1*  HCT 45.7 46.3* 44.1  < > 32.7* 33.0* 28.0* 28.5*  MCV 95.0 96.5 95.2  < > 96.7  --  97.9 96.9  PLT 418* 405.0* 430.0*  < > 213  --  190 209  < > = values in this interval not displayed.  Lipid Panel:  Recent Labs  02/07/13 1146  HDL 46   Cardiac Enzymes:  Recent Labs  03/28/13 1618  TROPONINI <0.30   CBG:  Recent Labs  07/08/13 2057 07/09/13 0602 07/09/13 1128  GLUCAP 111* 129* 87    Noninvasive Vascular Lab  Preoperative Vascular Evaluation  Patient:    Sarra, Rachels MR #:       [id number] Study Date: 07/02/2013 Gender:     F Age:        55 Height: Weight: BSA: Pt. Status: Room:       [place]    ATTENDING    Abran Cantor, Heron Sabins  SONOGRAPHER  [place], RVS  ADMITTING    Sherren Mocha Reports also to:  ------------------------------------------------------------ History and indications:  Indications  V72.81 Screening Pre-operative.  414.01 CAD.   History  Pre-op evaluation  ------------------------------------------------------------ Study information:  Study status:  Routine.  Procedure:  A vascular evaluation was performed. Image quality was good.    Preoperative vascular evaluation for heart surgery. Carotid duplex exam per standard extablished protocols, limited upper extremity arterial evaluation and ABIs as indicated.  Location: Vascular laboratory.  Patient status:  Inpatient.  Brachial pressures:  +--------+-----+----+---+         RightLeftMax +--------+-----+----+---+ Systolic126  160 737 +--------+-----+----+---+ Arterial pressure indices:  +-----------------+--------+--------------+--------+ Location         PressureBrachial indexWaveform +-----------------+--------+--------------+--------+ Right dorsal  ped 174mm Hg1.13          Biphasic +-----------------+--------+--------------+--------+ Right post tibial168mm Hg1.16          Biphasic +-----------------+--------+--------------+--------+ Left dorsal ped  173mm Hg1.17          Biphasic +-----------------+--------+--------------+--------+ Left post tibial 142mm Hg1.13          Biphasic +-----------------+--------+--------------+--------+ Arterial flow:  +------------+--------+-------+-----------+----------------+ Location    V sys   V ed   Flow       Comment                                     analysis                    +------------+--------+-------+-----------+----------------+ Right CCA - 59cm/s  16cm/s -----------Mild intimal     proximal                              wall changes     +------------+--------+-------+-----------+----------------+ Right CCA - -64cm/s -18cm/s--------------------------- distal                                                 +------------+--------+-------+-----------+----------------+ Right       -108cm/s-34cm/s-----------Moderate          carotid bulb                          heterogeneous                                          plaque at the                                          origin           +------------+--------+-------+-----------+----------------+ Right ECA   -183cm/s-15cm/s-----------Moderate                                               heterogeneous                                          plaque at the                                          origin on the                                          near wall        +------------+--------+-------+-----------+----------------+ Right ICA - -126cm/s-43cm/s--------------------------- proximal                                               +------------+--------+-------+-----------+----------------+  Right ICA - 96cm/s  25cm/s --------------------------- mid                                                    +------------+--------+-------+-----------+----------------+ Right ICA - -114cm/s-28cm/s--------------------------- distal                                                 +------------+--------+-------+-----------+----------------+ Right       49cm/s  15cm/s Antegrade  ---------------- vertebral                  flow                        +------------+--------+-------+-----------+----------------+ Left CCA -  76cm/s  20cm/s -----------Mild             proximal                              heterogeneous                                          plaque           +------------+--------+-------+-----------+----------------+ Left CCA -  -75cm/s -23cm/s-----------Mild intimal     distal                                wall changes     +------------+--------+-------+-----------+----------------+ Left carotid-88cm/s -30cm/s-----------Mild             bulb                                  heterogeneous                                           plaque and                                             intimal wall                                           changes          +------------+--------+-------+-----------+----------------+ Left ECA    -69cm/s -9cm/s --------------------------- +------------+--------+-------+-----------+----------------+ Left ICA -  -97cm/s -36cm/s-----------Mild             proximal                              heterogeneous  plaque           +------------+--------+-------+-----------+----------------+ Left ICA -  -96cm/s -28cm/s-----------Mil;d            mid                                   heterogeneous                                          plaque           +------------+--------+-------+-----------+----------------+ Left ICA -  -89cm/s -24cm/s--------------------------- distal                                                 +------------+--------+-------+-----------+----------------+ Left        39cm/s  11cm/s Antegrade  ---------------- vertebral                  flow                        +------------+--------+-------+-----------+----------------+ Right       --------------------------1.97             ICA/CCA                                                ratio                                                  +------------+--------+-------+-----------+----------------+ Left ICA/CCA--------------------------1.29             ratio                                                  +------------+--------+-------+-----------+----------------+ Right       126cm/s -------Triphasic  ---------------- brachial                   waveform                    +------------+--------+-------+-----------+----------------+ Right radial---------------Biphasic   ----------------                            waveform                     +------------+--------+-------+-----------+----------------+ Right ulnar ---------------Biphasic   ----------------                            waveform                    +------------+--------+-------+-----------+----------------+ Right palmar--------------------------Normal           arch                                                   +------------+--------+-------+-----------+----------------+  Left        115cm/s -------Triphasic  ---------------- brachial                   waveform                    +------------+--------+-------+-----------+----------------+ Left radial ---------------Triphasic  ----------------                            waveform                    +------------+--------+-------+-----------+----------------+ Left ulnar  ---------------Triphasic  ----------------                            waveform                    +------------+--------+-------+-----------+----------------+ Left palmar --------------------------Normal           arch                                                   +------------+--------+-------+-----------+----------------+  ------------------------------------------------------------ Summary:  - Right - 40% to 59% ICA stenosis. Vertebral artery flow is   antegrade. - Left - 1% to 39% ICA stenosis. Vertebral artery flow is   antegrade. - Palmar arch evaluation - Doppler waveforms remained normal   bilaterally with both radial and ulnar compressions - ABIs and Doppler waveforms are within normal limits   bilaterally at rest. Intraoperative Transesophageal Echocardiography  Patient:    Xela, Antoun MR #:       [id number] Study Date: 07/04/2013 Gender:     F Age:        59 Height: Weight: BSA: Pt. Status: Room:       [id number]    ATTENDING  Abran Cantor, Bryan  REFERRING  Bartle, Clarene Reamer cc:  ------------------------------------------------------------  ------------------------------------------------------------ Crist Fat Conclusions  - Left ventricle: The cavity size was normal. Wall thickness   was normal. Systolic function was normal. Wall motion was   normal; there were no regional wall motion abnormalities. - Aortic valve: Trivial regurgitation directed centrally in   the LVOT. Valve area: 1.56cm^2(VTI). Valve area: 1.6cm^2   (Vmax). - Aorta: The aorta was not dilated, moderately calcified,   and without evidence of coarctation. - Mitral valve: No evidence of vegetation. Mild   regurgitation. - Left atrium: No evidence of thrombus in the atrial cavity   or appendage. - Atrial septum: No defect or patent foramen ovale was   identified. - Tricuspid valve: No evidence of vegetation. Intraoperative transesophageal echocardiography.  ------------------------------------------------------------  ------------------------------------------------------------ Left ventricle:  The cavity size was normal. Wall thickness was normal. There was no hypertrophy. Systolic function was normal. Wall motion was normal; there were no regional wall motion abnormalities. The transmitral flow pattern was normal. The pulmonary vein flow pattern was normal. The tissue Doppler parameters were normal.  ------------------------------------------------------------ Aortic valve:  Mobile echodensity consitent with Lambl's excrescence.  Normal-sized annulus. Trileaflet; normal thickness leaflets. Cusp separation was normal. Mobility was not restricted. No echocardiographic evidence for prolapse. Doppler:  Transvalvular velocity was within the normal range.  Trivial regurgitation directed centrally in the LVOT.    VTI ratio of LVOT to  aortic valve: 0.61. Valve area: 1.56cm^2(VTI). Peak velocity ratio of LVOT to aortic valve: 0.63. Valve area: 1.6cm^2 (Vmax).    Mean gradient: 45mm  Hg (S). Peak gradient: 52mm Hg (S).  ------------------------------------------------------------ Aorta:  The aorta was not dilated, moderately calcified, and without evidence of coarctation. There was atheromatous plaque. There was severe, diffuse, fixed, protruding atheromatous plaque in the proximal descending aorta, extending to the abdominal aorta.  ------------------------------------------------------------ Mitral valve:   Structurally normal valve.   Leaflet separation was normal.  No evidence of vegetation.  Doppler:   Mild regurgitation.  ------------------------------------------------------------ Left atrium:   No evidence of thrombus in the atrial cavity or appendage.  ------------------------------------------------------------ Atrial septum:  No defect or patent foramen ovale was identified.  ------------------------------------------------------------ Right ventricle:  The cavity size was normal. Wall thickness was normal. Systolic function was normal.  ------------------------------------------------------------ Pulmonic valve:    Doppler:   Trivial regurgitation.  ------------------------------------------------------------ Tricuspid valve:   Structurally normal valve.   Leaflet separation was normal.  No evidence of vegetation.  Doppler:   No regurgitation.  ------------------------------------------------------------ Pre bypass:  Post bypass:  ------------------------------------------------------------ Post procedure conclusions Ascending Aorta:  - The aorta was not dilated, moderately calcified, and   without evidence of coarctation.  ------------------------------------------------------------  2D measurements   Normal       Doppler measurements   Normal LVOT                           LVOT Diam, S   18 mm   ------       Peak vel, S   106 cm/s ------ Area    2.54 cm^2 ------       VTI, S       25.8 cm   ------                                 Aortic valve                                Peak vel, S   168 cm/s ------                                Mean vel, S   114 cm/s ------                                VTI, S       42.1 cm   ------                                Mean            6 mm   ------                                gradient, S       Hg                                Peak           11 mm   ------  gradient, S       Hg                                VTI ratio    0.61      ------                                LVOT/AV                                Area, VTI    1.56 cm^2 ------                                Peak vel     0.63      ------                                ratio,                                LVOT/AV                                Area, Vmax    1.6 cm^2 ------   ------------------------------------------------------------ Prepared and Electronically Authenticated by  Corky Sox [id number]     Wall Scoring      2D Measurements    Dimensions                              LVIDD (cm)     LVIDS (cm)      IVS (cm)     LV PW (cm)      FS (%)     EF (%)      LA size (cm)     LA volume (cm3)      LV mass (g)     LV volume (cm3)                                           Aortic Root Measurements - End Diastolic                             Annulus (cm)     Sinus (cm)      STJ (cm)     Ao-prox (cm)      Ao-asc (cm)     Ao-arch (cm)                                           Main Pulmonary Measurements - End Diastolic                                 Annulus (cm)     MPA (cm)      LPA (cm)  RPA (cm)                                           Inferior Vena Cava                               Ostium (cm)     Proximal (cm)             Doppler Measurements    Aortic Valve                              Stenosis  Value  Regurgitation  Value   LVOT diam (cm)     SV 1 (cm3)      LVOT area (cm2)     SV 2 (cm3)      LVOT pk vel (m/s)     Reg frac (%)      LVOT VTI  (cm)     P 1/2 time (m/s)      Ao pk vel (m/s)     Vena cont (cm)      Ao VTI (cm)         Valve area (cm2)     PISA   Mean grad (mmHg)     Vnyquist (m/s)      Peak grad (mmHg)     Radius (cm)      Index-nat (V1/V2)     MR max vel (m/s)      Index-pros (V1/V2)     Area-PISA (cm2)             HCM    LVOT grad (mmHg)       Grad Vals (mmHg)       Grad Amyl (mmHg)       Grad exer (mmHg)                                            Mitral Valve                   Stenosis  Value  Regurgitation  Value   Mean grad (mmHg)     SV 1 (cm3)      Peak grad (mmHg)     SV 2 (cm3)      Area-PISA (cm2)     Reg frac (%)      Area-2D (cm2)     P 1/2 time (m/s)      Area-P 1/2 (cm2)     Vena cont (cm)      Area-cont eq (cm2)             Stress Evaluation  PISA   Exer grad (mmHg)     Vnyquist (m/s)      Exer area (mmHg)     Radius (cm)      Exer PAP (mmHg)     MR max vel (m/s)         Area-PISA (cm2)        Tricuspid Valve Stenosis  Value  Regurgitation  Value   RVOT diam (cm)     SV 1 (cm3)      RVOT area (cm2)     SV 2 (cm3)  RVOT pk vel (m/s)     Reg frac (%)      RVOT VTI (cm)     Vena cont (cm)      TV VTI (cm)         Mean grad (mmHg)       Peak grad (mmHg)       Valve area (cm2)           Stress Evaluation  PISA   Rest PAP (mmHg)     Vnyquist (m/s)      Peak PAP (mmHg)     Radius (cm)         MR max vel (m/s)       Area-PISA (cm2)       Pulmonic Valve Stenosis  Value  Regurgitation  Value   Mean grad (mmHg)     SV 1 (cm3)      Peak grad (mmHg)     SV 2 (cm3)      Valve area (cm2)     Reg frac (%)       Diastolic Filing E/A ratio     E decel time (msec)      IVRT (msec)     MV"A" dur (msec)      Pulm vein S/D ratio     Pulm AR dur (msec)      TDI     E/e' ratio       Shunt Ratio LVOT SV (cm3)     RVOT SV (cm3)      Qp:Qs ratio                 PORTABLE CHEST - 1 VIEW   COMPARISON:  Portable chest x-ray of 07/06/2013   FINDINGS: There is little change in  aeration with mild volume loss at the lung bases. A right central venous line tip overlies the lower SVC and cardiomegaly is stable. A tiny right apical pneumothorax is noted of less than 5%.   IMPRESSION: 1. Less than 5% right apical pneumothorax. 2. Little change in bibasilar atelectasis and mild cardiomegaly.    ASSESSMENT/PLAN:  CAD- status post CABG. Continue rehabilitation. Diabetes mellitus with vascular complications-well-controlled Renovascular hypertension-well controlled Constipation-new problem. Start MiraLax 17 g daily. Vertigo-new problem. Start meclizine 25 mg every 6 when necessary. Nausea-likely related to vertigo and constipation. Start Phenergan 25 mg every 6 when necessary Acute blood loss anemia-recheck Hypothyroidism-continue levothyroxine Hyperlipidemia-continue Pravachol Check CBC  I have reviewed patient's medical records received at admission/from hospitalization.  CPT CODE: 02637  Turon Kilmer Y Rolando [place], [place] [phone]

## 2013-07-14 ENCOUNTER — Encounter: Payer: Self-pay | Admitting: *Deleted

## 2013-07-25 ENCOUNTER — Encounter: Payer: Self-pay | Admitting: Adult Health

## 2013-07-25 ENCOUNTER — Non-Acute Institutional Stay (SKILLED_NURSING_FACILITY): Payer: Medicare Other | Admitting: Adult Health

## 2013-07-25 DIAGNOSIS — E039 Hypothyroidism, unspecified: Secondary | ICD-10-CM

## 2013-07-25 DIAGNOSIS — E1159 Type 2 diabetes mellitus with other circulatory complications: Secondary | ICD-10-CM

## 2013-07-25 DIAGNOSIS — I10 Essential (primary) hypertension: Secondary | ICD-10-CM

## 2013-07-25 DIAGNOSIS — Z951 Presence of aortocoronary bypass graft: Secondary | ICD-10-CM

## 2013-07-25 DIAGNOSIS — E785 Hyperlipidemia, unspecified: Secondary | ICD-10-CM

## 2013-07-25 NOTE — Progress Notes (Signed)
Patient ID: JAHNIYAH REVERE, female   DOB: 06/19/1935, 78 y.o.   MRN: [id number]              PROGRESS NOTE  DATE: 07/25/2013   FACILITY: [facility]  LEVEL OF CARE: SNF (31)  Acute Visit  CHIEF COMPLAINT:  Discharge Notes  HISTORY OF PRESENT ILLNESS: This is a 78 year old female who is for discharge home with Home health PT, OT and Nursing. DME: Rolling walker. She has been admitted to [facility] on 07/09/13 from [facility] with Severe 3 vessel CAD S/P Coronary artery bypass grafting. Patient was admitted to this facility for short-term rehabilitation after the patient's recent hospitalization.  Patient has completed SNF rehabilitation and therapy has cleared the patient for discharge.  Reassessment of ongoing problem(s):  HTN: Pt 's HTN remains stable.  Denies CP, sob, DOE, pedal edema, headaches, dizziness or visual disturbances.  No complications from the medications currently being used.  Last BP : 130/72  DM:pt's DM remains stable.  Pt denies polyuria, polydipsia, polyphagia, changes in vision or hypoglycemic episodes.  No complications noted from the medication presently being used.  5/15 hemoglobin A1c is: 6.4  HYPERLIPIDEMIA: No complications from the medications presently being used. 12/14 fasting lipid panel showed : Cholesterol 243 triglycerides 205 HDL 46 LDL 156  PAST MEDICAL HISTORY : Reviewed.  No changes/see problem list  CURRENT MEDICATIONS: Reviewed per MAR/see medication list  REVIEW OF SYSTEMS:  GENERAL: no change in appetite, no fatigue, no weight changes, no fever, chills or weakness RESPIRATORY: no cough, SOB, DOE, wheezing, hemoptysis CARDIAC: no chest pain, edema or palpitations GI: no abdominal pain, diarrhea, constipation, heart burn, nausea or vomiting  PHYSICAL EXAMINATION  GENERAL: no acute distress, normal body habitus EYES: conjunctivae normal, sclerae normal, normal eye lids NECK: supple, trachea midline, no neck  masses, no thyroid tenderness, no thyromegaly LYMPHATICS: no LAN in the neck, no supraclavicular LAN RESPIRATORY: breathing is even & unlabored, BS CTAB CARDIAC: RRR, no murmur,no extra heart sounds, no edema GI: abdomen soft, normal BS, no masses, no tenderness, no hepatomegaly, no splenomegaly EXTREMITIES: able to ambulate short distances PSYCHIATRIC: the patient is alert & oriented to person, affect & behavior appropriate  LABS/RADIOLOGY: 07/11/13 WBC 11.1 hemoglobin 9.5 hematocrit 32.0 Labs reviewed: Basic Metabolic Panel:  Recent Labs  07/04/13 1800 07/05/13 0400 07/05/13 1700 07/05/13 1708 07/06/13 0402 07/07/13 0330  NA  --  141  --  137 135* 137  K  --  4.5  --  4.3 3.7 4.3  CL  --  106  --  98 100 101  CO2  --  24  --   --  26 27  GLUCOSE  --  106*  --  117* 87 77  BUN  --  22  --  26* 25* 25*  CREATININE 0.88 0.94 1.17* 1.30* 1.05 1.02  CALCIUM  --  7.9*  --   --  7.9* 8.0*  MG 3.8* 2.8* 2.5  --   --   --    Liver Function Tests:  Recent Labs  02/07/13 1146 07/03/13 1650  AST 15 22  ALT 12 15  ALKPHOS 83 84  BILITOT 0.4 0.2*  PROT 7.5 7.7  ALBUMIN 4.3 3.9    CBC:  Recent Labs  03/28/13 1618 04/09/13 1437 06/26/13 1006  07/05/13 1700 07/05/13 1708 07/06/13 0402 07/07/13 0330  WBC 11.3* 11.8* 8.7  < > 20.0*  --  14.3* 11.4*  NEUTROABS 7.6 8.4*  6.1  --   --   --   --   --   HGB 14.6 14.9 14.3  < > 10.4* 11.2* 8.9* 9.1*  HCT 45.7 46.3* 44.1  < > 32.7* 33.0* 28.0* 28.5*  MCV 95.0 96.5 95.2  < > 96.7  --  97.9 96.9  PLT 418* 405.0* 430.0*  < > 213  --  190 209  < > = values in this interval not displayed.  Lipid Panel:  Recent Labs  02/07/13 1146  HDL 46   Cardiac Enzymes:  Recent Labs  03/28/13 1618  TROPONINI <0.30   CBG:  Recent Labs  07/08/13 2057 07/09/13 0602 07/09/13 1128  GLUCAP 111* 129* 87     ASSESSMENT/PLAN:  Severe three-vessel CAD status post coronary artery bypass grafting x4 - for home health PT, OT and  nursing Hypothyroidism - stable; continue Synthroid Diabetes mellitus, type II - continue Tradjenta Hypertension - well controlled; continue Cozaar Hyperlipidemia - continue Pravachol    I have filled out patient's discharge paperwork and written prescriptions.  Patient will receive home health PT, OT, ST, nursing and CNA.  DME provided: Rolling walker  Total discharge time: Greater than 30 minutes Discharge time involved coordination of the discharge process with Education officer, museum, nursing staff and therapy department. Medical justification for home health services/DME verified.  CPT CODE: 89373  Seth Bake - NP [facility] [phone]

## 2013-07-28 ENCOUNTER — Encounter: Payer: Medicare Other | Admitting: Physician Assistant

## 2013-07-28 ENCOUNTER — Encounter: Payer: Self-pay | Admitting: *Deleted

## 2013-07-30 DIAGNOSIS — I10 Essential (primary) hypertension: Secondary | ICD-10-CM

## 2013-07-30 DIAGNOSIS — I25810 Atherosclerosis of coronary artery bypass graft(s) without angina pectoris: Secondary | ICD-10-CM

## 2013-07-30 DIAGNOSIS — E119 Type 2 diabetes mellitus without complications: Secondary | ICD-10-CM

## 2013-07-30 DIAGNOSIS — Z48812 Encounter for surgical aftercare following surgery on the circulatory system: Secondary | ICD-10-CM

## 2013-07-30 DIAGNOSIS — E785 Hyperlipidemia, unspecified: Secondary | ICD-10-CM

## 2013-08-01 ENCOUNTER — Telehealth: Payer: Self-pay | Admitting: Cardiovascular Disease

## 2013-08-01 NOTE — Telephone Encounter (Signed)
New message    Will Dr. Acie Fredrickson sign home health orders .   Patient is home now getting therapy  Patient went to skilled nursing facility the MD there will not sign the home health order.   [name]

## 2013-08-01 NOTE — Telephone Encounter (Signed)
Amanda Curtis from [facility] calling wanting to know if Dr. Acie Fredrickson will sign orders.  She was d/c from hospital after CABG and went to skilled nursing facility.  Now they will be doing nurse visits,OT and PT.  Per Dr. Acie Fredrickson he will sign orders.

## 2013-08-05 ENCOUNTER — Other Ambulatory Visit: Payer: Self-pay | Admitting: Surgery

## 2013-08-05 DIAGNOSIS — I200 Unstable angina: Secondary | ICD-10-CM

## 2013-08-06 ENCOUNTER — Ambulatory Visit (INDEPENDENT_AMBULATORY_CARE_PROVIDER_SITE_OTHER): Payer: Self-pay | Admitting: Surgery

## 2013-08-06 ENCOUNTER — Ambulatory Visit
Admission: RE | Admit: 2013-08-06 | Discharge: 2013-08-06 | Disposition: A | Discharge status: Home / Self Care | Payer: Medicare Other | Source: Ambulatory Visit | Attending: Surgery | Admitting: Surgery

## 2013-08-06 ENCOUNTER — Encounter: Payer: Self-pay | Admitting: Surgery

## 2013-08-06 VITALS — BP 181/100 | HR 75 | Resp 16 | Ht 66.0 in | Wt 158.5 lb

## 2013-08-06 DIAGNOSIS — I200 Unstable angina: Secondary | ICD-10-CM

## 2013-08-06 DIAGNOSIS — Z951 Presence of aortocoronary bypass graft: Secondary | ICD-10-CM

## 2013-08-06 DIAGNOSIS — I2510 Atherosclerotic heart disease of native coronary artery without angina pectoris: Secondary | ICD-10-CM

## 2013-08-06 NOTE — Progress Notes (Signed)
HPI:  Patient returns for routine postoperative follow-up having undergone CABG on 07/04/2013. The patient's early postoperative recovery while in the hospital was notable for an uncomplicated postop course. She was discharged to a skilled nursing facility for rehab. Since hospital discharge the patient reports that she is improving but still sore particularly late in the day. She is walking without chest pain or shortness of breath.   Current Outpatient Prescriptions  Medication Sig Dispense Refill  . acetaminophen (TYLENOL) 325 MG tablet Take 2 tablets (650 mg total) by mouth every 6 (six) hours as needed for mild pain.      [name] aspirin EC 81 MG tablet Take 81 mg by mouth daily.      [name] docusate sodium (COLACE) 100 MG capsule Take 100 mg by mouth 2 (two) times daily. For constipation      . Fluticasone Propionate (FLONASE NA) Place 1 spray into the nose daily as needed (for allergies).       [name] HYDROmorphone (DILAUDID) 2 MG tablet Take one tablet by mouth every 4 hours as needed for pain  180 tablet  0  . levothyroxine (SYNTHROID, LEVOTHROID) 100 MCG tablet Take 100 mcg by mouth daily before breakfast.      . linagliptin (TRADJENTA) 5 MG TABS tablet Take 5 mg by mouth daily. For DM      . losartan (COZAAR) 50 MG tablet Take 50 mg by mouth daily. For HTN      . meclizine (ANTIVERT) 25 MG tablet Take 25 mg by mouth every 6 (six) hours as needed for dizziness.      . polyethylene glycol (MIRALAX / GLYCOLAX) packet Take 17 g by mouth daily. For constipation      . pravastatin (PRAVACHOL) 40 MG tablet Take 40 mg by mouth every evening. For hyperlipidemia      . promethazine (PHENERGAN) 25 MG tablet Take 25 mg by mouth every 6 (six) hours as needed for nausea or vomiting.      . traMADol (ULTRAM) 50 MG tablet Take 1-2 tablets (50-100 mg total) by mouth every 4 (four) hours as needed for moderate pain.  30 tablet  0   No current facility-administered medications for this visit.    Physical  Exam: BP 181/100  Pulse 75  Resp 16  Ht 5\' 6"  (1.676 m)  Wt 158 lb 8 oz (71.895 kg)  BMI 25.59 kg/m2  SpO2 98% She looks well Lungs are clear Cardiac exam shows a regular rate and rhythm with normal heart sounds. The chest incision is healing well and the sternum is stable. The leg incisions are healing well and there is no significant edema.   Diagnostic Tests:  CLINICAL DATA: INTERMEDIATE CORONARY SYNDROME  EXAM:  CHEST 2 VIEW  COMPARISON: Portable chest 07/07/2013  FINDINGS:  Median sternotomy changes are stable. Stable atherosclerotic  calcifications in the aorta.  Right internal jugular central venous catheter has been removed. The  small right apical pneumothorax has resolved. Minimal scarring  versus atelectasis within the lung bases. There is mild to moderate  hyperinflation, the lungs otherwise clear.  IMPRESSION:  Mild to moderate COPD changes. No acute cardiopulmonary disease.  Scarring versus atelectasis, minimal, lung bases.  Electronically Signed  By: Margaree Mackintosh M.D.  On: 08/06/2013 10:31   Impression:  Overall I think she is doing well. I encouraged her to continue walking. She is planning to participate in cardiac rehab. I told her that she could drive her car but should not  lift anything heavier than 10 lbs for three months postop.   Plan:  She will continue to follow up with cardiology and her primary physician and will contact me if she develops any problems with her incisions.

## 2013-08-08 ENCOUNTER — Telehealth: Payer: Self-pay | Admitting: Cardiovascular Disease

## 2013-08-08 NOTE — Telephone Encounter (Signed)
HHN from [place] calling stating she was at Ms. Desantis's home this AM about 10:30.  Her BP was 168/94 in (L) arm; 150/90 in (R).  States pt took her BP meds at 8:30.  Pt told her that she didn't sleep well due to discomfort from surgical incision.  Advised nurse to monitor and nurse will be back out on Monday.  Pt does not have a BP cuff to monitor at home.  Nurse will call son and see if he can get cuff for her to check BP over the week-end.  Also noticed she doesn't have fu visit in our office.  App on 6/1 was cancelled due to her just getting out of rehab.  Will reschedule her a office visit.

## 2013-08-08 NOTE — Telephone Encounter (Signed)
New problem    OT is at pt's home and want to report pt's BP is elevated left arm 168/94..right arm 150/90.Marland Kitchenpatient as taken her meds. Please advise.

## 2013-08-11 ENCOUNTER — Ambulatory Visit: Payer: Medicare Other | Admitting: Physician Assistant

## 2013-08-29 ENCOUNTER — Other Ambulatory Visit: Payer: Self-pay | Admitting: Family

## 2013-09-02 ENCOUNTER — Other Ambulatory Visit: Payer: Self-pay | Admitting: Family

## 2013-09-02 ENCOUNTER — Encounter: Payer: Self-pay | Admitting: *Deleted

## 2013-09-02 NOTE — Telephone Encounter (Signed)
Could you please schedule pt for follow up visit?

## 2013-09-04 NOTE — Telephone Encounter (Signed)
Left message for patient to return my call.

## 2013-09-04 NOTE — Telephone Encounter (Signed)
Appointment scheduled for 09/22/13

## 2013-09-08 ENCOUNTER — Ambulatory Visit (INDEPENDENT_AMBULATORY_CARE_PROVIDER_SITE_OTHER): Payer: Medicare Other | Admitting: Physician Assistant

## 2013-09-08 ENCOUNTER — Encounter: Payer: Self-pay | Admitting: Physician Assistant

## 2013-09-08 VITALS — BP 150/100 | HR 62 | Ht 66.0 in | Wt 152.0 lb

## 2013-09-08 DIAGNOSIS — I658 Occlusion and stenosis of other precerebral arteries: Secondary | ICD-10-CM

## 2013-09-08 DIAGNOSIS — I2510 Atherosclerotic heart disease of native coronary artery without angina pectoris: Secondary | ICD-10-CM

## 2013-09-08 DIAGNOSIS — I10 Essential (primary) hypertension: Secondary | ICD-10-CM

## 2013-09-08 DIAGNOSIS — E785 Hyperlipidemia, unspecified: Secondary | ICD-10-CM

## 2013-09-08 DIAGNOSIS — I6523 Occlusion and stenosis of bilateral carotid arteries: Secondary | ICD-10-CM

## 2013-09-08 DIAGNOSIS — I6529 Occlusion and stenosis of unspecified carotid artery: Secondary | ICD-10-CM | POA: Insufficient documentation

## 2013-09-08 MED ORDER — LOSARTAN POTASSIUM 100 MG PO TABS: 100.0000 mg | ORAL_TABLET | Freq: Every day | ORAL | Status: DC

## 2013-09-08 NOTE — Patient Instructions (Signed)
INCREASE LOSARTAN TO 100 MG DAILY, NEW RX SENT IN TODAY  LAB WORK TODAY; BMET  PLEASE HAVE YOUR PRIMARY CARE PHYSICIAN GET LAB WORK FOR Amanda Curtis, [name] ; BMET, FASTING LIPID AND LIVER PANEL; WITH THE RESULTS TO BE FAXED TO [place], [place]  You have been referred to [facility]  Your physician recommends that you schedule a follow-up appointment in: [place]

## 2013-09-08 NOTE — Progress Notes (Signed)
Cardiology Office Note    Date:  09/08/2013   ID:  AVARY EICHENBERGER, DOB May 31, 1935, MRN [id number]  PCP:  Nance Pear., NP  Cardiologist:  Dr. Liam Rogers >>> Dr. Sherren Mocha     History of Present Illness: Amanda Curtis is a 78 y.o. female with a history of diabetes, HTN, HL and family history of premature CAD. She was recently seen for chest pain and had an abnormal Myoview. Recommendation was to proceed with cardiac catheterization. Patient opted for a second opinion in [facility]. Coronary CTA demonstrated a high calcium score and evidence of 3 vessel disease. She returned to our office in May with plans to proceed with cardiac catheterization. This demonstrated 3 vessel CAD and CABG was recommended. This was performed by Dr. Cyndia Bent 07/04/13 (LIMA-LAD, SVG-diagonal, SVG-OM and SVG-PDA). Postoperative course was notable for bradycardia. Her beta blocker was stopped. She remained in NSR. She was discharged to skilled nursing.  She returns for follow up.  She is back at home. She has noted that her blood pressures are increasing. Her chest is still sore. She is only walking about 5 minutes a day. She denies significant dyspnea. She denies orthopnea or significant pedal edema. She denies syncope.   Studies:  - LHC (06/30/13):  Dist LM 30-40%, prox LAD 80%, mid LAD 95-99%, Dx 90% then 60-70% at D3, CFX 90%, mid RCA 95%, dist RCA 60-70% >>> CABG  - Nuclear (02/2013):  Inferolateral and anterolateral ischemia, EF 73%  - Carotid US (07/02/13):  RICA 79-89%, LICA 2-11%  - Intraop TEE (07/04/13):  No RWMA; normal LVF  Recent Labs: 02/07/2013: HDL Cholesterol by NMR 46; LDL (calc) 156*  03/25/2013: TSH 1.646  07/03/2013: ALT 15  07/07/2013: Creatinine 1.02; Hemoglobin 9.1*; Potassium 4.3   Wt Readings from Last 3 Encounters:  09/08/13 152 lb (68.947 kg)  08/06/13 158 lb 8 oz (71.895 kg)  07/25/13 160 lb 6.4 oz (72.757 kg)     Past Medical History  Diagnosis Date  . Hypertension     . Arthritis   . Graves disease 2000    s/p RIA  . Diabetes mellitus without complication   . Insomnia     Current Outpatient Prescriptions  Medication Sig Dispense Refill  . acetaminophen (TYLENOL) 325 MG tablet Take 2 tablets (650 mg total) by mouth every 6 (six) hours as needed for mild pain.      [name] aspirin EC 81 MG tablet Take 81 mg by mouth daily.      . Fluticasone Propionate (FLONASE NA) Place 1 spray into the nose daily as needed (for allergies).       . losartan (COZAAR) 50 MG tablet take 1 tablet by mouth once daily  30 tablet  0  . meclizine (ANTIVERT) 25 MG tablet Take 25 mg by mouth every 6 (six) hours as needed for dizziness.      . pravastatin (PRAVACHOL) 40 MG tablet take 1 tablet by mouth every evening  30 tablet  0  . promethazine (PHENERGAN) 25 MG tablet Take 25 mg by mouth every 6 (six) hours as needed for nausea or vomiting.      [name] SYNTHROID 100 MCG tablet take 1 tablet by mouth every morning  30 tablet  0  . TRADJENTA 5 MG TABS tablet take 1 tablet by mouth once daily  30 tablet  0   No current facility-administered medications for this visit.    Allergies:   Atorvastatin; Elavil; Hytrin; and Erythromycin  Social History:  The patient  reports that she has never smoked. She has never used smokeless tobacco. She reports that she drinks alcohol. She reports that she does not use illicit drugs.   Family History:  The patient's family history includes Cancer in her mother; Dementia in her mother; Diabetes in her father and paternal grandmother; Hypertension in her paternal grandmother.   ROS:  Please see the history of present illness.   She is not sleeping very well.   All other systems reviewed and negative.   PHYSICAL EXAM: VS:  BP 150/100  Pulse 62  Ht 5\' 6"  (1.676 m)  Wt 152 lb (68.947 kg)  BMI 24.55 kg/m2 Well nourished, well developed, in no acute distress HEENT: normal Neck: no JVD Cardiac:  normal S1, S2; RRR; no murmur Lungs:  clear to  auscultation bilaterally, no wheezing, rhonchi or rales Abd: soft, nontender, no hepatomegaly Ext: no edema Skin: warm and dry Neuro:  CNs 2-12 intact, no focal abnormalities noted  EKG:  NSR, HR 62, low voltage, no change from prior tracing     ASSESSMENT AND PLAN:  1. Coronary artery disease involving native coronary artery of native heart without angina pectoris:  The patient had many questions about her cardiac catheterization findings prior to surgery and what other options there were instead of surgery. I reviewed all this with her today and tried to answer all of her questions. She will be referred to cardiac rehabilitation in [facility]. Continue aspirin, statin. 2. Unspecified essential hypertension:  Blood pressure is uncontrolled. Increase losartan to 100 mg daily. Check a basic metabolic panel today and repeat in the next 2 weeks.  She was previously on HCTZ. This may need to be resumed if her blood pressure remains elevated. 3. Hyperlipidemia :  Continue statin. Arrange followup lipids and LFTs. 4. Carotid stenosis, bilateral:  She will likely need followup carotid US in 1 year. 5. Disposition: She does not want to see Dr. Acie Fredrickson in follow up. I will arrange follow up with Dr. Burt Knack who performed her cardiac catheterization.   Signed, Versie Starks, MHS 09/08/2013 2:20 PM    [facility] [address], [place], [place]  [address] Phone: [phone]; Fax: [phone]

## 2013-09-09 ENCOUNTER — Telehealth: Payer: Self-pay | Admitting: *Deleted

## 2013-09-09 LAB — BASIC METABOLIC PANEL
BUN: 25 mg/dL — ABNORMAL HIGH (ref 6–23)
CALCIUM: 9.4 mg/dL (ref 8.4–10.5)
CO2: 23 mEq/L (ref 19–32)
Chloride: 104 mEq/L (ref 96–112)
Creatinine, Ser: 1 mg/dL (ref 0.4–1.2)
GFR: 56.57 mL/min — ABNORMAL LOW (ref >60.00)
GLUCOSE: 104 mg/dL — AB (ref 70–99)
Potassium: 4.4 mEq/L (ref 3.5–5.1)
SODIUM: 136 meq/L (ref 135–145)

## 2013-09-09 NOTE — Telephone Encounter (Signed)
lmom lab work looks good, kidney function ok. If any questions can call [phone].

## 2013-09-11 ENCOUNTER — Ambulatory Visit: Payer: Medicare Other | Admitting: Physician Assistant

## 2013-09-11 ENCOUNTER — Telehealth: Payer: Self-pay | Admitting: Physician Assistant

## 2013-09-11 DIAGNOSIS — Z0289 Encounter for other administrative examinations: Secondary | ICD-10-CM

## 2013-09-11 NOTE — Telephone Encounter (Signed)
Pt called wants to wait appt for today until she can see Debbrah Alar, did mark as NOS

## 2013-09-11 NOTE — Telephone Encounter (Signed)
Thanks for making me aware  

## 2013-09-20 ENCOUNTER — Other Ambulatory Visit: Payer: Self-pay | Admitting: Family

## 2013-09-22 ENCOUNTER — Encounter: Payer: Self-pay | Admitting: Family

## 2013-09-22 ENCOUNTER — Ambulatory Visit (INDEPENDENT_AMBULATORY_CARE_PROVIDER_SITE_OTHER): Payer: Medicare Other | Admitting: Family

## 2013-09-22 VITALS — BP 158/98 | HR 60 | Temp 97.6°F | Resp 18 | Ht 65.5 in | Wt 157.1 lb

## 2013-09-22 DIAGNOSIS — E119 Type 2 diabetes mellitus without complications: Secondary | ICD-10-CM

## 2013-09-22 DIAGNOSIS — E1142 Type 2 diabetes mellitus with diabetic polyneuropathy: Secondary | ICD-10-CM | POA: Insufficient documentation

## 2013-09-22 DIAGNOSIS — E1159 Type 2 diabetes mellitus with other circulatory complications: Secondary | ICD-10-CM

## 2013-09-22 DIAGNOSIS — I10 Essential (primary) hypertension: Secondary | ICD-10-CM

## 2013-09-22 DIAGNOSIS — I2510 Atherosclerotic heart disease of native coronary artery without angina pectoris: Secondary | ICD-10-CM

## 2013-09-22 DIAGNOSIS — F411 Generalized anxiety disorder: Secondary | ICD-10-CM

## 2013-09-22 DIAGNOSIS — F419 Anxiety disorder, unspecified: Secondary | ICD-10-CM

## 2013-09-22 DIAGNOSIS — E1149 Type 2 diabetes mellitus with other diabetic neurological complication: Secondary | ICD-10-CM

## 2013-09-22 LAB — POCT CBG MONITORING: Glucose: 95 mg/dL (ref 60–200)

## 2013-09-22 MED ORDER — ESCITALOPRAM OXALATE 10 MG PO TABS: ORAL_TABLET | ORAL | Status: DC

## 2013-09-22 MED ORDER — HYDROCHLOROTHIAZIDE 25 MG PO TABS: 25.0000 mg | ORAL_TABLET | Freq: Every day | ORAL | Status: DC

## 2013-09-22 NOTE — Assessment & Plan Note (Signed)
Continue tight glycemic control.  Monitor.

## 2013-09-22 NOTE — Progress Notes (Signed)
Subjective:    Patient ID: Amanda Curtis, female    DOB: 03/24/1935, 78 y.o.   MRN: [id number]  HPI  Amanda Curtis is a 78 yr old female who presents today for follow up of multiple medical problems.  1) Anxiety- Reports severe anxiety. Reports difficulty sleeping. Reports that she took zoloft and found that this worsened her symptoms.  Never did try the lexapro that was prescribed last spring.   2) HTN- recently saw cardiology and blood pressure was elevated.  Losartan was increased to 100mg  daily.  She reports that she is now taking the 100 mg dose of losartan. BP Readings from Last 3 Encounters:  09/22/13 158/98  09/08/13 150/100  08/06/13 181/100   3) DM2- reports tradjenta- 95$. Had abdominal cramping on metformin.  Reports sugar this AM ws 95.    Lab Results  Component Value Date   HGBA1C 6.4* 06/30/2013   HGBA1C 6.6* 02/07/2013   Lab Results  Component Value Date   MICROALBUR 1.44 02/07/2013   LDLCALC 156* 02/07/2013   CREATININE 1.0 09/08/2013    4) CAD-s/p CABG 5/15. She went to short term SNF following her surgery and is now home.  She continues to have discomfort surrounding her sternal incision and the vein harvesting incisions on her right leg.  Will need flp next visit. She is on pravastatin.    Review of Systems    see HPI  Past Medical History  Diagnosis Date  . Hypertension   . Arthritis   . Graves disease 2000    s/p RIA  . Diabetes mellitus without complication   . Insomnia     History   Social History  . Marital Status: Widowed    Spouse Name: N/A    Number of Children: N/A  . Years of Education: N/A   Occupational History  . Not on file.   Social History Main Topics  . Smoking status: Never Smoker   . Smokeless tobacco: Never Used  . Alcohol Use: Yes     Comment: 1 glass wine every 6 months  . Drug Use: No  . Sexual Activity: Not on file   Other Topics Concern  . Not on file   Social History Narrative   Widow   Retired worked  for [employer] and did a lot of travelling for [employer].   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   She has 1 son- lives in [place]   3 grandsons- college age (one is in [place])             Past Surgical History  Procedure Laterality Date  . Appendectomy    . Nasal septum surgery      childhood  . Intraoperative transesophageal echocardiogram N/A 07/04/2013    Procedure: INTRAOPERATIVE TRANSESOPHAGEAL ECHOCARDIOGRAM;  Surgeon: Gaye Pollack, MD;  Location: [facility] OR;  Service: Open Heart Surgery;  Laterality: N/A;  . Coronary artery bypass graft N/A 07/04/2013    Procedure: CORONARY ARTERY BYPASS GRAFTING (CABG) times four using left internal mammary artery and right saphenous leg vein.;  Surgeon: Gaye Pollack, MD;  Location: [facility] OR;  Service: Open Heart Surgery;  Laterality: N/A;    Family History  Problem Relation Age of Onset  . Dementia Mother   . Cancer Mother     breast  . Diabetes Father   . Diabetes Paternal Grandmother   . Hypertension Paternal Grandmother  Allergies  Allergen Reactions  . Atorvastatin Other (See Comments)    Severe muscle cramping  . Elavil [Amitriptyline] Other (See Comments)    Tongue swelling  . Hytrin [Terazosin] Other (See Comments)    syncope  . Erythromycin Rash    Current Outpatient Prescriptions on File Prior to Visit  Medication Sig Dispense Refill  . acetaminophen (TYLENOL) 325 MG tablet Take 2 tablets (650 mg total) by mouth every 6 (six) hours as needed for mild pain.      [name] aspirin EC 81 MG tablet Take 81 mg by mouth daily.      . Fluticasone Propionate (FLONASE NA) Place 1 spray into the nose daily as needed (for allergies).       . losartan (COZAAR) 100 MG tablet Take 1 tablet (100 mg total) by mouth daily.  30 tablet  11  . meclizine (ANTIVERT) 25 MG tablet Take 25 mg by mouth every 6 (six) hours as needed for dizziness.      . pravastatin  (PRAVACHOL) 40 MG tablet take 1 tablet by mouth every evening  30 tablet  0  . promethazine (PHENERGAN) 25 MG tablet Take 25 mg by mouth every 6 (six) hours as needed for nausea or vomiting.      [name] SYNTHROID 100 MCG tablet take 1 tablet by mouth every morning  30 tablet  0  . TRADJENTA 5 MG TABS tablet take 1 tablet by mouth once daily  30 tablet  0   No current facility-administered medications on file prior to visit.    BP 158/98  Pulse 60  Temp(Src) 97.6 F (36.4 C) (Oral)  Resp 18  Ht 5' 5.5" (1.664 m)  Wt 157 lb 1.9 oz (71.269 kg)  BMI 25.74 kg/m2  SpO2 97%    Objective:   Physical Exam  Constitutional: She is oriented to person, place, and time. She appears well-developed and well-nourished. No distress.  Cardiovascular: Normal rate and regular rhythm.   No murmur heard. Pulmonary/Chest: Effort normal and breath sounds normal. No respiratory distress. She has no wheezes. She has no rales. She exhibits no tenderness.  Musculoskeletal: She exhibits no edema.  Neurological: She is alert and oriented to person, place, and time.  Psychiatric: Judgment and thought content normal.  Anxious appearing          Assessment & Plan:

## 2013-09-22 NOTE — Assessment & Plan Note (Signed)
Uncontrolled.  Will resume hctz, continue losartan 100mg . She was intolerant to beta blockers during her hospitalizations due to bradycardia.

## 2013-09-22 NOTE — Assessment & Plan Note (Signed)
Uncontrolled.  We discussed trial of lexapro. If no improvement could try low dose benzo, however given her advanced age, I would like to try lexapro first.

## 2013-09-22 NOTE — Telephone Encounter (Signed)
Rx failed to transmit. Left tradjenta refill on pharmacy voicemail.

## 2013-09-22 NOTE — Patient Instructions (Addendum)
Please restart hctz. Continue losartan. Start lexapro 10mg  1/2 tab by mouth once daily on week one, increase to a full tab once daily on week two. Follow up in 1 week.

## 2013-09-22 NOTE — Assessment & Plan Note (Signed)
At goal.  She does not wish to change tradjenta at this time. Continue same.

## 2013-09-22 NOTE — Assessment & Plan Note (Signed)
Clinically stable following CABG.  Management per cardiology. Continue ASA, statin.

## 2013-10-15 ENCOUNTER — Telehealth: Payer: Self-pay | Admitting: Cardiovascular Disease

## 2013-10-15 NOTE — Telephone Encounter (Signed)
Faxed detailed reports and order and received confirmation.  Spoke with Jasmine at [facility] who confirmed that she has received patient's paperwork and thanked me for the call

## 2013-10-15 NOTE — Telephone Encounter (Signed)
New message    Office calling checking on the status of cardiac rehab.

## 2013-10-20 ENCOUNTER — Telehealth: Payer: Self-pay | Admitting: Family

## 2013-10-20 DIAGNOSIS — I2510 Atherosclerotic heart disease of native coronary artery without angina pectoris: Secondary | ICD-10-CM

## 2013-10-20 NOTE — Telephone Encounter (Signed)
Patient is requesting a refill of synthroid. Please call patient when sent.

## 2013-10-21 ENCOUNTER — Ambulatory Visit: Payer: Medicare Other | Admitting: Family

## 2013-10-21 MED ORDER — SYNTHROID 100 MCG PO TABS: ORAL_TABLET | ORAL | Status: DC

## 2013-10-21 MED ORDER — LOSARTAN POTASSIUM 100 MG PO TABS: 100.0000 mg | ORAL_TABLET | Freq: Every day | ORAL | Status: DC

## 2013-10-21 NOTE — Telephone Encounter (Signed)
Patient states she told the person yesterday that she is out of her synthroid and needs this refill asap. And was assured that this would be done yesterday.  She is also out of [place].  [facility] [address] in [place].  Patient is coming for an appointment tomorrow.

## 2013-10-21 NOTE — Telephone Encounter (Signed)
Noted, refills are being sent this afternoon.

## 2013-10-21 NOTE — Telephone Encounter (Signed)
Refills sent, pt notified.

## 2013-10-22 ENCOUNTER — Telehealth: Payer: Self-pay | Admitting: *Deleted

## 2013-10-22 ENCOUNTER — Encounter: Payer: Self-pay | Admitting: Family

## 2013-10-22 ENCOUNTER — Ambulatory Visit (INDEPENDENT_AMBULATORY_CARE_PROVIDER_SITE_OTHER): Payer: Medicare Other | Admitting: Family

## 2013-10-22 VITALS — BP 112/50 | HR 63 | Temp 97.8°F | Resp 16 | Ht 65.5 in | Wt 156.1 lb

## 2013-10-22 DIAGNOSIS — G4700 Insomnia, unspecified: Secondary | ICD-10-CM

## 2013-10-22 DIAGNOSIS — E1159 Type 2 diabetes mellitus with other circulatory complications: Secondary | ICD-10-CM

## 2013-10-22 DIAGNOSIS — F411 Generalized anxiety disorder: Secondary | ICD-10-CM

## 2013-10-22 DIAGNOSIS — F419 Anxiety disorder, unspecified: Secondary | ICD-10-CM

## 2013-10-22 DIAGNOSIS — I10 Essential (primary) hypertension: Secondary | ICD-10-CM

## 2013-10-22 DIAGNOSIS — E785 Hyperlipidemia, unspecified: Secondary | ICD-10-CM

## 2013-10-22 MED ORDER — ALPRAZOLAM 0.25 MG PO TABS: ORAL_TABLET | ORAL | Status: DC

## 2013-10-22 NOTE — Assessment & Plan Note (Signed)
Add HS PRN xanax.

## 2013-10-22 NOTE — Progress Notes (Signed)
Pre visit review using our clinic review tool, if applicable. No additional management support is needed unless otherwise documented below in the visit note. 

## 2013-10-22 NOTE — Telephone Encounter (Signed)
Pt in office for follow up. States that Synthroid Rx was sent to the incorrect [facility] on 10/21/13. Reviewed EPIC, rx went to [facility] in [place]. Apologized to pt for error and confirmed with pharmacist Jaclyn Shaggy at [facility] on [address] that she will pull Rx from system and fill Rx now. Advised her that pt would pick it up when she leaves our office. Notified pt.

## 2013-10-22 NOTE — Patient Instructions (Signed)
You may use xanax as needed for panic attacks or insomnia. Please complete lab work prior to leaving.  Follow up in 2 months, sooner if problems/concerns.

## 2013-10-22 NOTE — Progress Notes (Signed)
Subjective:    Patient ID: Amanda Curtis, female    DOB: [birth year], 78 y.o.   MRN: [id number]  HPI  Amanda Curtis is a 78 yr old female who presents today for follow up.   Anxiety- last visit she reported that her anxiety was very uncontrolled.  She was started on lexapro. Reports that she is up to a full tab of lexapro.  No improvement in anxiety so far. Continues to have difficulty sleeping. Reports that in the past she took [place]. But this was discontinued by her previous MD.      HTN-  Last visit BP was uncontrolled.  HCTZ was added to her regimen.   BP Readings from Last 3 Encounters:  10/22/13 112/50  09/22/13 158/98  09/08/13 150/100    Review of Systems See HPI  Past Medical History  Diagnosis Date  . Hypertension   . Arthritis   . Graves disease 2000    s/p RIA  . Diabetes mellitus without complication   . Insomnia     History   Social History  . Marital Status: Widowed    Spouse Name: N/A    Number of Children: N/A  . Years of Education: N/A   Occupational History  . Not on file.   Social History Main Topics  . Smoking status: Never Smoker   . Smokeless tobacco: Never Used  . Alcohol Use: Yes     Comment: 1 glass wine every 6 months  . Drug Use: No  . Sexual Activity: Not on file   Other Topics Concern  . Not on file   Social History Narrative   Widow   Retired worked for [employer] and did a lot of travelling for [employer].   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   She has 1 son- lives in [place]   3 grandsons- college age (one is in [place])             Past Surgical History  Procedure Laterality Date  . Appendectomy    . Nasal septum surgery      childhood  . Intraoperative transesophageal echocardiogram N/A 07/04/2013    Procedure: INTRAOPERATIVE TRANSESOPHAGEAL ECHOCARDIOGRAM;  Surgeon: Gaye Pollack, MD;  Location: [facility] OR;  Service: Open Heart  Surgery;  Laterality: N/A;  . Coronary artery bypass graft N/A 07/04/2013    Procedure: CORONARY ARTERY BYPASS GRAFTING (CABG) times four using left internal mammary artery and right saphenous leg vein.;  Surgeon: Gaye Pollack, MD;  Location: [facility] OR;  Service: Open Heart Surgery;  Laterality: N/A;    Family History  Problem Relation Age of Onset  . Dementia Mother   . Cancer Mother     breast  . Diabetes Father   . Diabetes Paternal Grandmother   . Hypertension Paternal Grandmother     Allergies  Allergen Reactions  . Atorvastatin Other (See Comments)    Severe muscle cramping  . Elavil [Amitriptyline] Other (See Comments)    Tongue swelling  . Hytrin [Terazosin] Other (See Comments)    syncope  . Erythromycin Rash    Current Outpatient Prescriptions on File Prior to Visit  Medication Sig Dispense Refill  . acetaminophen (TYLENOL) 325 MG tablet Take 2 tablets (650 mg total) by mouth every 6 (six) hours as needed for mild pain.      [name] aspirin EC 81 MG tablet Take 81 mg by mouth daily.      [name]  escitalopram (LEXAPRO) 10 MG tablet 1/2 tab by mouth once daily for 1 week.  Increase to a full tab once daily on week two.  30 tablet  0  . Fluticasone Propionate (FLONASE NA) Place 1 spray into the nose daily as needed (for allergies).       . hydrochlorothiazide (HYDRODIURIL) 25 MG tablet Take 1 tablet (25 mg total) by mouth daily.  30 tablet  2  . losartan (COZAAR) 100 MG tablet Take 1 tablet (100 mg total) by mouth daily.  30 tablet  1  . meclizine (ANTIVERT) 25 MG tablet Take 25 mg by mouth every 6 (six) hours as needed for dizziness.      . pravastatin (PRAVACHOL) 40 MG tablet take 1 tablet by mouth every evening  30 tablet  0  . promethazine (PHENERGAN) 25 MG tablet Take 25 mg by mouth every 6 (six) hours as needed for nausea or vomiting.      [name] SYNTHROID 100 MCG tablet take 1 tablet by mouth every morning  30 tablet  1  . TRADJENTA 5 MG TABS tablet take 1 tablet by mouth once daily   30 tablet  3   No current facility-administered medications on file prior to visit.    BP 100/80  Pulse 63  Temp(Src) 97.8 F (36.6 C) (Oral)  Resp 16  Ht 5' 5.5" (1.664 m)  Wt 156 lb 1.3 oz (70.797 kg)  BMI 25.57 kg/m2  SpO2 97%       Objective:   Physical Exam  Constitutional: She is oriented to person, place, and time. She appears well-developed and well-nourished. No distress.  HENT:  Head: Normocephalic and atraumatic.  Cardiovascular: Normal rate and regular rhythm.   No murmur heard. Pulmonary/Chest: Effort normal and breath sounds normal. No respiratory distress. She has no wheezes. She has no rales. She exhibits no tenderness.  Neurological: She is alert and oriented to person, place, and time.  Psychiatric: She has a normal mood and affect. Her behavior is normal. Judgment and thought content normal.          Assessment & Plan:

## 2013-10-23 ENCOUNTER — Telehealth: Payer: Self-pay | Admitting: Family

## 2013-10-23 LAB — BASIC METABOLIC PANEL
BUN: 35 mg/dL — ABNORMAL HIGH (ref 6–23)
CHLORIDE: 102 meq/L (ref 96–112)
CO2: 26 mEq/L (ref 19–32)
Calcium: 9.1 mg/dL (ref 8.4–10.5)
Creat: 1.23 mg/dL — ABNORMAL HIGH (ref 0.50–1.10)
Glucose, Bld: 88 mg/dL (ref 70–99)
Potassium: 4.6 mEq/L (ref 3.5–5.3)
SODIUM: 135 meq/L (ref 135–145)

## 2013-10-23 LAB — LIPID PANEL
Cholesterol: 305 mg/dL — ABNORMAL HIGH (ref 0–200)
HDL: 43 mg/dL (ref >39)
LDL CALC: 206 mg/dL — AB (ref 0–99)
Total CHOL/HDL Ratio: 7.1 Ratio
Triglycerides: 280 mg/dL — ABNORMAL HIGH (ref <150)
VLDL: 56 mg/dL — ABNORMAL HIGH (ref 0–40)

## 2013-10-23 MED ORDER — PITAVASTATIN CALCIUM 2 MG PO TABS: 1.0000 | ORAL_TABLET | Freq: Every day | ORAL | Status: DC

## 2013-10-23 NOTE — Assessment & Plan Note (Signed)
Uncontrolled.  Lipid panel performed:  Lab Results  Component Value Date   CHOL 305* 10/22/2013   HDL 43 10/22/2013   LDLCALC 206* 10/22/2013   TRIG 280* 10/22/2013   CHOLHDL 7.1 10/22/2013   Has had intolerance to atorvastatin, currently on 40mg  pravastatin, trial of livalo. See phone note.

## 2013-10-23 NOTE — Assessment & Plan Note (Signed)
Still uncontrolled despite lexapro. Will continue lexapro and add prn xanax. A controlled substance contract is signed today and the pt is aware not to drive after taking.

## 2013-10-23 NOTE — Telephone Encounter (Signed)
Please contact pt and let her know that her cholesterol is extremely high. LDL is 200 and should be <100.  I would recommend trial of Livalo 2 mg instead of pravastatin. It is stronger and is better tolerated than most of the other statins she has tried in the past.  rx pended below. Let me know if she has any problems affording the medication.  Repeat flp/lft in 6 weeks.

## 2013-10-23 NOTE — Assessment & Plan Note (Signed)
Pt was given coupon card for tradjenta today to help with cost hopefully.

## 2013-10-23 NOTE — Assessment & Plan Note (Signed)
Stable on hctz, continue same, obtain follow up bmet.

## 2013-10-28 ENCOUNTER — Encounter: Payer: Self-pay | Admitting: Family

## 2013-10-28 ENCOUNTER — Ambulatory Visit (INDEPENDENT_AMBULATORY_CARE_PROVIDER_SITE_OTHER): Payer: Medicare Other | Admitting: Family

## 2013-10-28 ENCOUNTER — Telehealth: Payer: Self-pay | Admitting: *Deleted

## 2013-10-28 VITALS — BP 138/82 | HR 66 | Temp 97.8°F | Resp 16 | Ht 65.5 in | Wt 155.2 lb

## 2013-10-28 DIAGNOSIS — J329 Chronic sinusitis, unspecified: Secondary | ICD-10-CM | POA: Insufficient documentation

## 2013-10-28 DIAGNOSIS — J019 Acute sinusitis, unspecified: Secondary | ICD-10-CM

## 2013-10-28 DIAGNOSIS — E1159 Type 2 diabetes mellitus with other circulatory complications: Secondary | ICD-10-CM

## 2013-10-28 MED ORDER — AZITHROMYCIN 250 MG PO TABS: ORAL_TABLET | ORAL | Status: DC

## 2013-10-28 NOTE — Progress Notes (Signed)
Subjective:    Patient ID: Amanda Curtis, female    DOB: February 26, 1936, 78 y.o.   MRN: [id number]  HPI  Amanda Curtis is a 78 yr old female who presents today to discuss "hypoglycemia." She reports that yesterday at her cardiac rehab her glucose was 91 and she was not allowed to exercise.  Reports that she ate a bowel of cereal prior to exercise.  Reports that they gave her some juice but sugar was <100.  She did not check her sugar today because her machine is broken.  She reports that she has not started tradjenta. She reports that she has drastically improved her diet.  Lab Results  Component Value Date   HGBA1C 6.4* 06/30/2013   Nasal congestion- reports that she has left sided facial pain and nasal congestion.  She also had dizziness yesterday while at the grocery store. Reports that she has tolerated zpak without rash. (note in chart of rash with e-mycin)  Hyperlipidemia- was told that livalo will need prior auth.   Review of Systems    see HPI  Past Medical History  Diagnosis Date  . Hypertension   . Arthritis   . Graves disease 2000    s/p RIA  . Diabetes mellitus without complication   . Insomnia     History   Social History  . Marital Status: Widowed    Spouse Name: N/A    Number of Children: N/A  . Years of Education: N/A   Occupational History  . Not on file.   Social History Main Topics  . Smoking status: Never Smoker   . Smokeless tobacco: Never Used  . Alcohol Use: Yes     Comment: 1 glass wine every 6 months  . Drug Use: No  . Sexual Activity: Not on file   Other Topics Concern  . Not on file   Social History Narrative   Widow   Retired worked for [employer] and did a lot of travelling for [employer].   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   She has 1 son- lives in [place]   3 grandsons- college age (one is in [place])             Past Surgical History    Procedure Laterality Date  . Appendectomy    . Nasal septum surgery      childhood  . Intraoperative transesophageal echocardiogram N/A 07/04/2013    Procedure: INTRAOPERATIVE TRANSESOPHAGEAL ECHOCARDIOGRAM;  Surgeon: Gaye Pollack, MD;  Location: [facility] OR;  Service: Open Heart Surgery;  Laterality: N/A;  . Coronary artery bypass graft N/A 07/04/2013    Procedure: CORONARY ARTERY BYPASS GRAFTING (CABG) times four using left internal mammary artery and right saphenous leg vein.;  Surgeon: Gaye Pollack, MD;  Location: [facility] OR;  Service: Open Heart Surgery;  Laterality: N/A;    Family History  Problem Relation Age of Onset  . Dementia Mother   . Cancer Mother     breast  . Diabetes Father   . Diabetes Paternal Grandmother   . Hypertension Paternal Grandmother     Allergies  Allergen Reactions  . Atorvastatin Other (See Comments)    Severe muscle cramping  . Elavil [Amitriptyline] Other (See Comments)    Tongue swelling  . Hytrin [Terazosin] Other (See Comments)    syncope  . Erythromycin Rash    Current Outpatient Prescriptions on File Prior to Visit  Medication Sig Dispense Refill  . acetaminophen (TYLENOL) 325 MG tablet Take 2 tablets (650 mg total) by mouth every 6 (six) hours as needed for mild pain.      [name] ALPRAZolam (XANAX) 0.25 MG tablet One tablet by mouth twice daily as needed for anxiety or sleep  45 tablet  0  . aspirin EC 81 MG tablet Take 81 mg by mouth daily.      [name] escitalopram (LEXAPRO) 10 MG tablet 1/2 tab by mouth once daily for 1 week.  Increase to a full tab once daily on week two.  30 tablet  0  . Fluticasone Propionate (FLONASE NA) Place 1 spray into the nose daily as needed (for allergies).       . hydrochlorothiazide (HYDRODIURIL) 25 MG tablet Take 1 tablet (25 mg total) by mouth daily.  30 tablet  2  . losartan (COZAAR) 100 MG tablet Take 1 tablet (100 mg total) by mouth daily.  30 tablet  1  . meclizine (ANTIVERT) 25 MG tablet Take 25 mg by mouth every 6  (six) hours as needed for dizziness.      . Pitavastatin Calcium (LIVALO) 2 MG TABS Take 1 tablet (2 mg total) by mouth daily.  30 tablet  1  . promethazine (PHENERGAN) 25 MG tablet Take 25 mg by mouth every 6 (six) hours as needed for nausea or vomiting.      [name] SYNTHROID 100 MCG tablet take 1 tablet by mouth every morning  30 tablet  1  . TRADJENTA 5 MG TABS tablet take 1 tablet by mouth once daily  30 tablet  3   No current facility-administered medications on file prior to visit.    BP 138/82  Pulse 66  Temp(Src) 97.8 F (36.6 C) (Oral)  Resp 16  Ht 5' 5.5" (1.664 m)  Wt 155 lb 3.2 oz (70.398 kg)  BMI 25.42 kg/m2    Objective:   Physical Exam  Constitutional: She is oriented to person, place, and time. She appears well-developed and well-nourished. No distress.  HENT:  Head: Normocephalic and atraumatic.  Cardiovascular: Normal rate and regular rhythm.   No murmur heard. Pulmonary/Chest: Effort normal and breath sounds normal. No respiratory distress. She has no wheezes. She has no rales. She exhibits no tenderness.  Lymphadenopathy:    She has no cervical adenopathy.  Neurological: She is alert and oriented to person, place, and time.  Skin: Skin is warm and dry.  Psychiatric: She has a normal mood and affect. Her behavior is normal. Judgment and thought content normal.          Assessment & Plan:

## 2013-10-28 NOTE — Progress Notes (Signed)
Pre visit review using our clinic review tool, if applicable. No additional management support is needed unless otherwise documented below in the visit note. 

## 2013-10-28 NOTE — Patient Instructions (Signed)
Please start zpak for sinus.  Add glass of OJ with breakfast on the days you go to physical therapy. Call if sinus symptoms worsen or if they do not improve in 1 week.

## 2013-10-28 NOTE — Assessment & Plan Note (Signed)
Advised pt ok to stay off of tradjenta given current sugars. Advised her to check sugar twice daily and contact me if sugars running >150.  I gave her a hand written Rx for freestyle meter and strips.

## 2013-10-28 NOTE — Assessment & Plan Note (Signed)
Will rx with zpak.

## 2013-10-28 NOTE — Telephone Encounter (Signed)
Pt asked at end of follow up visit today if she should continue Tradjenta. States she has not been taking it due to cost and was going to check with pharmacist today to see if copay card will offset her cost. Per verbal from Provider, ok to hold off on Tradjenta at this time. Pt advised to check glucose once a day and call if reading greater than 150. Pt voices understanding.

## 2013-10-29 ENCOUNTER — Telehealth: Payer: Self-pay | Admitting: *Deleted

## 2013-10-29 NOTE — Telephone Encounter (Signed)
Livalo prior auth request submitted via cover my meds. Awaiting determination.

## 2013-10-31 ENCOUNTER — Telehealth: Payer: Self-pay | Admitting: *Deleted

## 2013-10-31 NOTE — Telephone Encounter (Signed)
Spoke with pt. She states she still hasn't gotten her Tradjenta yet. Reminded pt that she is to hold off on Tradjenta for now and call glucose > 150. Pt voices understanding. States that she has been drinking a 6-8 oz glass of orange juice each morning before she goes to cardiac rehab. She reports that her glucose is still below 100 when she gets there. They give her more juice to drink before exercise. Reports that after exercise glucose is back under 100. Pt wants to know when she is due to recheck her a1c and thyroid? States she was originally referred to an endocrinologist for both of these before her heart work up and wants to know if she should be referred to endo again?

## 2013-10-31 NOTE — Telephone Encounter (Signed)
Pt was made aware and we are working on prior [place] for livalo.

## 2013-10-31 NOTE — Telephone Encounter (Signed)
Received approval from insurance from 10/29/13 through 02/26/14. Faxed approval to pharmacy and notified pt.

## 2013-10-31 NOTE — Telephone Encounter (Signed)
At this point, I do not think she needs to see endocrinology at this time. Sugar and thyroid appear stable from my standpoint.  Remain off of tradjenta.  As long as her sugars are staying above 80 I am ok with it if she is off of all her diabetes meds.

## 2013-11-04 ENCOUNTER — Ambulatory Visit: Payer: Medicare Other | Admitting: Physician Assistant

## 2013-11-04 NOTE — Telephone Encounter (Signed)
Notified pt and she voices understanding. 

## 2013-11-04 NOTE — Telephone Encounter (Signed)
Left message on voicemail to return my call.  

## 2013-11-04 NOTE — Telephone Encounter (Signed)
Patient returned phone call. °

## 2013-11-04 NOTE — Telephone Encounter (Signed)
Left message for pt to return my call.

## 2013-12-12 ENCOUNTER — Other Ambulatory Visit: Payer: Self-pay | Admitting: Family

## 2013-12-12 NOTE — Telephone Encounter (Signed)
Rx request to pharmacy/[facility]  

## 2013-12-18 ENCOUNTER — Ambulatory Visit (INDEPENDENT_AMBULATORY_CARE_PROVIDER_SITE_OTHER): Payer: Medicare Other | Admitting: Cardiovascular Disease

## 2013-12-18 ENCOUNTER — Encounter: Payer: Self-pay | Admitting: Cardiovascular Disease

## 2013-12-18 VITALS — BP 152/98 | HR 74 | Ht 65.5 in | Wt 151.1 lb

## 2013-12-18 DIAGNOSIS — I2510 Atherosclerotic heart disease of native coronary artery without angina pectoris: Secondary | ICD-10-CM

## 2013-12-18 NOTE — Patient Instructions (Signed)
Your physician wants you to follow-up in: 6 MONTHS with Dr Cooper.  You will receive a reminder letter in the mail two months in advance. If you don't receive a letter, please call our office to schedule the follow-up appointment.  Your physician recommends that you continue on your current medications as directed. Please refer to the Current Medication list given to you today.  

## 2013-12-18 NOTE — Progress Notes (Signed)
Background: 78 year old woman with three-vessel coronary artery disease status post CABG in May 2015. Other problems include hypertension, hyperlipidemia, and bilateral carotid stenosis.  HPI:  The patient has a lot of questions about why she needed bypass surgery. We reviewed her symptoms of unstable angina with resting chest burning and multivessel coronary artery disease requiring urgent CABG. Chest burning has resolved. She denies shortness of breath. She still has a lot of issues with stress and anxiety. She does have some pain in the center of her chest and the left side. She relates this to sternotomy healing. Her current pain is completely different than what she experienced in the context of her unstable angina. She has no other complaints today. She denies exertional dyspnea, leg swelling, orthopnea, or PND.  Studies:  Cardiac catheterization 06/30/2013: Procedural Findings:  Hemodynamics:  AO 116/61 with a mean of 83  LV 114/5  Coronary angiography:  Coronary dominance: right  Left mainstem: The left main is heavily calcified. The vessel has 30-40% distal stenosis before it divides into the LAD and left circumflex.  Left anterior descending (LAD): The LAD has severe diffuse calcification and severe diffuse stenosis to the proximal and midportion. There are sequential 80% proximal LAD stenoses then 95-99% mid LAD stenosis just after the first diagonal. The diagonal branch has diffuse 90% stenosis. The LAD is then diffusely diseased down to the apex with nonobstructive stenosis distally, but 60-70% stenosis at the origin of the third diagonal branch.  Left circumflex (LCx): The left circumflex as heavy calcification. The proximal vessel is patent. The first obtuse marginal divides into twin vessels essentially supplying OM 1 and OM 2 territories. Prior to the bifurcation there is 90% stenosis present.  Right coronary artery (RCA): The RCA is a large, dominant vessel. The vessel has severe  diffuse calcification and severe 95% mid vessel stenosis. The distal vessel before the bifurcation of the PDA and PLA branches has 60-70% stenosis. The PDA and PLA branches are patent.  Left subclavian angiography: The subclavian has heavy calcification at its origin. There is no significant stenosis. The LIMA appears suitable for an arterial conduit.  Left ventriculography: Deferred  Final Conclusions:  1. Severe native three-vessel coronary artery disease with severe calcific stenosis of the LAD, left circumflex, and right coronary arteries  2. Patent left subclavian artery with patent left internal mammary artery  3. Known normal LV function by nuclear stress test with LVEF 73%  Recommendations: Hospital admission in this patient with severe multivessel coronary artery disease and symptoms of daily chest burning consistent with unstable angina. We'll start her on IV heparin.  Sherren Mocha  06/30/2013, 1:58 PM  Outpatient Encounter Prescriptions as of 12/18/2013  Medication Sig  . acetaminophen (TYLENOL) 325 MG tablet Take 2 tablets (650 mg total) by mouth every 6 (six) hours as needed for mild pain.  [name] ALPRAZolam (XANAX) 0.25 MG tablet One tablet by mouth twice daily as needed for anxiety or sleep  . aspirin EC 81 MG tablet Take 81 mg by mouth daily.  [name] escitalopram (LEXAPRO) 10 MG tablet 1/2 tab by mouth once daily for 1 week.  Increase to a full tab once daily on week two.  [name] Fluticasone Propionate (FLONASE NA) Place 1 spray into the nose daily as needed (for allergies).   . hydrochlorothiazide (HYDRODIURIL) 25 MG tablet Take 1 tablet (25 mg total) by mouth daily.  [name] losartan (COZAAR) 100 MG tablet take 1 tablet by mouth once daily  . meclizine (ANTIVERT) 25  MG tablet Take 25 mg by mouth every 6 (six) hours as needed for dizziness.  . Pitavastatin Calcium (LIVALO) 2 MG TABS Take 1 tablet (2 mg total) by mouth daily.  . promethazine (PHENERGAN) 25 MG tablet Take 25 mg by mouth every 6 (six)  hours as needed for nausea or vomiting.  [name] SYNTHROID 100 MCG tablet take 1 tablet by mouth every morning  . [DISCONTINUED] azithromycin (ZITHROMAX) 250 MG tablet 2 tabs by mouth today, then one tab by mouth daily for 4 more days.    Allergies  Allergen Reactions  . Atorvastatin Other (See Comments)    Severe muscle cramping  . Elavil [Amitriptyline] Other (See Comments)    Tongue swelling  . Hytrin [Terazosin] Other (See Comments)    syncope  . Erythromycin Rash    Past Medical History  Diagnosis Date  . Hypertension   . Arthritis   . Graves disease 2000    s/p RIA  . Diabetes mellitus without complication   . Insomnia     family history includes Cancer in her mother; Dementia in her mother; Diabetes in her father and paternal grandmother; Hypertension in her paternal grandmother.   ROS: Negative except as per HPI  BP 152/98  Pulse 74  Ht 5' 5.5" (1.664 m)  Wt 151 lb 1.9 oz (68.548 kg)  BMI 24.76 kg/m2  PHYSICAL EXAM: Pt is alert and oriented, NAD HEENT: normal Neck: JVP - normal, carotids 2+= without bruits Lungs: CTA bilaterally CV: RRR without murmur or gallop Abd: soft, NT, Positive BS, no hepatomegaly Ext: no C/C/E, distal pulses intact and equal Skin: warm/dry no rash  ASSESSMENT AND PLAN: 1. CAD status post CABG. The patient is stable with no symptoms of angina at present. She will continue on aspirin for antiplatelet therapy. I will see her back in 6 months. I think she is making good progress and we had extensive discussion today about why she in fact needed CABG as well as prognostic in the setting of her coronary artery disease and surgical revascularization.   2. Hyperlipidemia. She's had some problems with statin intolerance, but seems to be doing well on Livalo. Labs followed by PCP.  3. HTN, essential. Blood pressure is elevated today. She is on a combination of losartan hydrochlorothiazide. She admits to an inordinate amount of stress today. We'll  continue to follow. She reports good blood pressure readings with cardiac rehabilitation.  For followup I will plan on seeing her in 6 months.  Sherren Mocha 12/18/2013 3:39 PM

## 2013-12-23 ENCOUNTER — Ambulatory Visit (INDEPENDENT_AMBULATORY_CARE_PROVIDER_SITE_OTHER): Payer: Medicare Other | Admitting: Family

## 2013-12-23 ENCOUNTER — Encounter: Payer: Self-pay | Admitting: Family

## 2013-12-23 VITALS — BP 126/78 | HR 60 | Temp 98.1°F | Resp 16 | Ht 65.5 in | Wt 153.0 lb

## 2013-12-23 DIAGNOSIS — I10 Essential (primary) hypertension: Secondary | ICD-10-CM

## 2013-12-23 DIAGNOSIS — F419 Anxiety disorder, unspecified: Secondary | ICD-10-CM

## 2013-12-23 DIAGNOSIS — E119 Type 2 diabetes mellitus without complications: Secondary | ICD-10-CM

## 2013-12-23 DIAGNOSIS — E785 Hyperlipidemia, unspecified: Secondary | ICD-10-CM

## 2013-12-23 DIAGNOSIS — R300 Dysuria: Secondary | ICD-10-CM

## 2013-12-23 DIAGNOSIS — I6523 Occlusion and stenosis of bilateral carotid arteries: Secondary | ICD-10-CM

## 2013-12-23 DIAGNOSIS — Z23 Encounter for immunization: Secondary | ICD-10-CM

## 2013-12-23 LAB — POCT URINALYSIS DIPSTICK
Bilirubin, UA: NEGATIVE
GLUCOSE UA: NEGATIVE
Ketones, UA: NEGATIVE
NITRITE UA: NEGATIVE
Protein, UA: NEGATIVE
Spec Grav, UA: 1.02
UROBILINOGEN UA: 4
pH, UA: 7

## 2013-12-23 MED ORDER — ACCU-CHEK AVIVA DEVI: Status: DC

## 2013-12-23 MED ORDER — GLUCOSE BLOOD VI STRP: 1.0000 | ORAL_STRIP | Freq: Every day | Status: DC | PRN

## 2013-12-23 MED ORDER — LOSARTAN POTASSIUM 100 MG PO TABS: 50.0000 mg | ORAL_TABLET | Freq: Every day | ORAL | Status: DC

## 2013-12-23 MED ORDER — NYSTATIN 100000 UNIT/GM EX POWD: CUTANEOUS | Status: DC

## 2013-12-23 MED ORDER — PRAVASTATIN SODIUM 80 MG PO TABS: 80.0000 mg | ORAL_TABLET | Freq: Every day | ORAL | Status: DC

## 2013-12-23 NOTE — Assessment & Plan Note (Signed)
Fair control, stopped lexapro because it was not helping.

## 2013-12-23 NOTE — Patient Instructions (Signed)
Please complete lab work prior to leaving. Start pravastatin 80mg  once daily.  Restart losartan 100mg  but take only 1/2 tab once daily. Follow up in 3 months, sooner if problems/concerns.

## 2013-12-23 NOTE — Assessment & Plan Note (Signed)
Mild sxs, UA notes trace leuks only. Await culture results, treat if +.

## 2013-12-23 NOTE — Progress Notes (Addendum)
Subjective:    Patient ID: [place], female    DOB: Jul 23, 1935, 78 y.o.   MRN: [id number]  HPI  Amanda [place] is a 78 yr old female who presents today for follow up of multiple medical issues.  Anxiety Pt here for 2 month follow up. Reports that she could not sleep when she was on the lexapro.  She reports that she uses xanax very rarely.    Hypertension-Pt here for 2 month follow up. Pt reports that she has been out of losartan 100mg  x 2 days. Was taking 50mg  once daily.  Diabetes -Pt states her glucometer was recently thrown away and she needs new Rx for accuchek aviva glucometer and test strips.    Dysuria Pt reports burning with urination only when she takes certain medications (tyleno / advil).   Hyperlipidemia Pt never started Livalo. Would like new rx sent to pharmacy. Had leg cramping on atorvastatin, tolerated pravastatin but it was not effective.  Livalo is too expensive.     BP Readings from Last 3 Encounters:  12/23/13 126/78  12/18/13 152/98  10/28/13 138/82      Review of Systems See HPI  Past Medical History  Diagnosis Date  . Hypertension   . Arthritis   . Graves disease 2000    s/p RIA  . Diabetes mellitus without complication   . Insomnia     History   Social History  . Marital Status: Widowed    Spouse Name: N/A    Number of Children: N/A  . Years of Education: N/A   Occupational History  . Not on file.   Social History Main Topics  . Smoking status: Never Smoker   . Smokeless tobacco: Never Used  . Alcohol Use: Yes     Comment: 1 glass wine every 6 months  . Drug Use: No  . Sexual Activity: Not on file   Other Topics Concern  . Not on file   Social History Narrative   Widow   Retired worked for [employer] and did a lot of travelling for [employer].   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   She has 1 son- lives in [place]   3 grandsons-  college age (one is in [place])             Past Surgical History  Procedure Laterality Date  . Appendectomy    . Nasal septum surgery      childhood  . Intraoperative transesophageal echocardiogram N/A 07/04/2013    Procedure: INTRAOPERATIVE TRANSESOPHAGEAL ECHOCARDIOGRAM;  Surgeon: Gaye Pollack, MD;  Location: [facility] OR;  Service: Open Heart Surgery;  Laterality: N/A;  . Coronary artery bypass graft N/A 07/04/2013    Procedure: CORONARY ARTERY BYPASS GRAFTING (CABG) times four using left internal mammary artery and right saphenous leg vein.;  Surgeon: Gaye Pollack, MD;  Location: [facility] OR;  Service: Open Heart Surgery;  Laterality: N/A;    Family History  Problem Relation Age of Onset  . Dementia Mother   . Cancer Mother     breast  . Diabetes Father   . Diabetes Paternal Grandmother   . Hypertension Paternal Grandmother     Allergies  Allergen Reactions  . Atorvastatin Other (See Comments)    Severe muscle cramping  . Elavil [Amitriptyline] Other (See Comments)    Tongue swelling  . Hytrin [Terazosin] Other (See Comments)    syncope  .  Erythromycin Rash    Current Outpatient Prescriptions on File Prior to Visit  Medication Sig Dispense Refill  . acetaminophen (TYLENOL) 325 MG tablet Take 2 tablets (650 mg total) by mouth every 6 (six) hours as needed for mild pain.      [name] ALPRAZolam (XANAX) 0.25 MG tablet One tablet by mouth twice daily as needed for anxiety or sleep  45 tablet  0  . aspirin EC 81 MG tablet Take 81 mg by mouth daily.      . Fluticasone Propionate (FLONASE NA) Place 1 spray into the nose daily as needed (for allergies).       . hydrochlorothiazide (HYDRODIURIL) 25 MG tablet Take 1 tablet (25 mg total) by mouth daily.  30 tablet  2  . SYNTHROID 100 MCG tablet take 1 tablet by mouth every morning  30 tablet  1   No current facility-administered medications on file prior to visit.    BP 126/78  Pulse 60  Temp(Src) 98.1 F (36.7 C) (Oral)  Resp 16  Ht 5'  5.5" (1.664 m)  Wt 153 lb (69.4 kg)  BMI 25.06 kg/m2  SpO2 99%       Objective:   Physical Exam  Constitutional: She is oriented to person, place, and time. She appears well-developed and well-nourished. No distress.  Cardiovascular: Normal rate and regular rhythm.   No murmur heard. Pulmonary/Chest: Effort normal and breath sounds normal. No respiratory distress. She has no wheezes. She has no rales. She exhibits no tenderness.  Neurological: She is alert and oriented to person, place, and time.  Skin: Skin is warm and dry.  Psychiatric: Her behavior is normal. Judgment and thought content normal.  Anxious appearing female.           Assessment & Plan:  Addendum: pt reports intermittent rash beneath abd skin fold. rx given for nystatin powder.

## 2013-12-23 NOTE — Progress Notes (Signed)
Pre visit review using our clinic review tool, if applicable. No additional management support is needed unless otherwise documented below in the visit note. 

## 2013-12-23 NOTE — Assessment & Plan Note (Signed)
Cannot afford livalo, did not tolerate atorvastatin.  Pravastatin 40mg  was not effective.  Will resume pravastatin at 80 mg.

## 2013-12-23 NOTE — Assessment & Plan Note (Signed)
Obtain A1C, rx given for new glucometer.

## 2013-12-23 NOTE — Assessment & Plan Note (Signed)
BP looks ok today but was elevated last week, resume losartan at 50mg  once daily.

## 2013-12-24 ENCOUNTER — Encounter: Payer: Self-pay | Admitting: Family

## 2013-12-24 LAB — URINE CULTURE
COLONY COUNT: NO GROWTH
ORGANISM ID, BACTERIA: NO GROWTH

## 2013-12-24 LAB — HEMOGLOBIN A1C: Hgb A1c MFr Bld: 6.7 % — ABNORMAL HIGH (ref 4.6–6.5)

## 2013-12-25 ENCOUNTER — Encounter: Payer: Self-pay | Admitting: Family

## 2013-12-26 ENCOUNTER — Other Ambulatory Visit: Payer: Self-pay | Admitting: Family

## 2013-12-26 NOTE — Telephone Encounter (Signed)
Ok to send refill  

## 2013-12-29 ENCOUNTER — Telehealth: Payer: Self-pay | Admitting: Family

## 2013-12-29 ENCOUNTER — Telehealth: Payer: Self-pay | Admitting: *Deleted

## 2013-12-29 MED ORDER — ALPRAZOLAM 0.25 MG PO TABS: ORAL_TABLET | ORAL | Status: DC

## 2013-12-29 MED ORDER — SYNTHROID 100 MCG PO TABS: ORAL_TABLET | ORAL | Status: DC

## 2013-12-29 NOTE — Telephone Encounter (Signed)
A1C is slightly higher but still at goal of <7.  I think that it is ok to check sugar once daily, but she should vary the times of day that she checks and continue to work on healthy diet, exercise.

## 2013-12-29 NOTE — Telephone Encounter (Signed)
rx refill- xanax .25mg   Last OV- 12/24/23 Last refilled- 10/22/13 #45/ 0 rf  UDS- none.

## 2013-12-29 NOTE — Telephone Encounter (Signed)
Rx called to pharmacy voicemail. 

## 2013-12-29 NOTE — Telephone Encounter (Signed)
Refill sent. Spoke with pt. She states Hgb a1c was higher this time. States she continues to check blood sugar before breakfast of a day. Wants to know if she should be checking her blood sugar more than once a day or at any other time of the day?  Please advise.

## 2013-12-29 NOTE — Telephone Encounter (Signed)
OK to send 45 tabs no refills.

## 2013-12-29 NOTE — Telephone Encounter (Signed)
Caller name: [place] Relation to pt: self Call back number: [phone] Pharmacy: [facility] on [address]  Reason for call:   Patient is requesting a refill of synthroid. She would also like to discuss A1c level.

## 2013-12-30 NOTE — Telephone Encounter (Signed)
Left message for pt to return my call.

## 2013-12-31 NOTE — Telephone Encounter (Signed)
Notified pt and she voices understanding. 

## 2014-01-02 ENCOUNTER — Telehealth: Payer: Self-pay | Admitting: Family

## 2014-01-02 NOTE — Telephone Encounter (Signed)
Please request pt return for repeat UDS.  Quantity was not sufficient to complete test.

## 2014-01-06 ENCOUNTER — Other Ambulatory Visit: Payer: Medicare Other

## 2014-01-06 NOTE — Telephone Encounter (Signed)
Notified pt and scheduled lab appt for today at 1:45pm.

## 2014-01-12 ENCOUNTER — Other Ambulatory Visit: Payer: Self-pay | Admitting: Family

## 2014-01-12 ENCOUNTER — Encounter: Payer: Self-pay | Admitting: Family Medicine

## 2014-02-05 ENCOUNTER — Encounter (HOSPITAL_COMMUNITY): Payer: Self-pay | Admitting: Cardiovascular Disease

## 2014-02-06 ENCOUNTER — Encounter: Payer: Self-pay | Admitting: Family

## 2014-03-25 ENCOUNTER — Ambulatory Visit: Payer: Medicare Other | Admitting: Family

## 2014-04-02 ENCOUNTER — Ambulatory Visit (INDEPENDENT_AMBULATORY_CARE_PROVIDER_SITE_OTHER): Payer: Medicare Other | Admitting: Cardiovascular Disease

## 2014-04-02 ENCOUNTER — Ambulatory Visit: Payer: Self-pay | Admitting: Cardiovascular Disease

## 2014-04-02 ENCOUNTER — Encounter: Payer: Self-pay | Admitting: Cardiovascular Disease

## 2014-04-02 VITALS — BP 160/92 | HR 60 | Ht 65.5 in | Wt 155.4 lb

## 2014-04-02 DIAGNOSIS — I10 Essential (primary) hypertension: Secondary | ICD-10-CM

## 2014-04-02 DIAGNOSIS — I25119 Atherosclerotic heart disease of native coronary artery with unspecified angina pectoris: Secondary | ICD-10-CM

## 2014-04-02 MED ORDER — AMLODIPINE BESYLATE 5 MG PO TABS: 5.0000 mg | ORAL_TABLET | Freq: Every day | ORAL | Status: DC

## 2014-04-02 NOTE — Progress Notes (Signed)
Cardiology Office Note   Date:  04/02/2014   ID:  [place], DOB [birth year], MRN [id number]  PCP:  Nance Pear., NP  Cardiologist:  Sherren Mocha, MD    Chief Complaint  Patient presents with  . Follow-up     History of Present Illness: [place] is a 79 y.o. female who presents for follow-up CAD.  She had CABG in May 2015. Other problems include hypertension, hyperlipidemia, and bilateral carotid stenosis.  She has had a lot of stress at home. She feels pain in her left chest, primarily after she takes her antihypertensive medications. She also reports sternal chest pain she feels is related to her surgical incision/sternotomy. She also describes a pressure-like sensation across the upper chest. Prior to CABG, she had burning chest pain and this has not recurred. She notes poor control of her blood pressure. Average blood pressures are greater than 150/90. She denies headache, lightheadedness, heart palpitations, or shortness of breath. She is frustrated with weight gain.  Past Medical History  Diagnosis Date  . Hypertension   . Arthritis   . Graves disease 2000    s/p RIA  . Diabetes mellitus without complication   . Insomnia     Past Surgical History  Procedure Laterality Date  . Appendectomy    . Nasal septum surgery      childhood  . Intraoperative transesophageal echocardiogram N/A 07/04/2013    Procedure: INTRAOPERATIVE TRANSESOPHAGEAL ECHOCARDIOGRAM;  Surgeon: Gaye Pollack, MD;  Location: [facility] OR;  Service: Open Heart Surgery;  Laterality: N/A;  . Coronary artery bypass graft N/A 07/04/2013    Procedure: CORONARY ARTERY BYPASS GRAFTING (CABG) times four using left internal mammary artery and right saphenous leg vein.;  Surgeon: Gaye Pollack, MD;  Location: [facility] OR;  Service: Open Heart Surgery;  Laterality: N/A;  . Left heart catheterization with coronary angiogram N/A 06/30/2013    Procedure: LEFT HEART CATHETERIZATION WITH CORONARY ANGIOGRAM;  Surgeon:  Blane Ohara, MD;  Location: [facility] CATH LAB;  Service: Cardiovascular;  Laterality: N/A;    Current Outpatient Prescriptions  Medication Sig Dispense Refill  . acetaminophen (TYLENOL) 325 MG tablet Take 2 tablets (650 mg total) by mouth every 6 (six) hours as needed for mild pain.    [name] aspirin EC 81 MG tablet Take 81 mg by mouth daily.    . Fluticasone Propionate (FLONASE NA) Place 1 spray into the nose daily as needed (for allergies).     . hydrochlorothiazide (HYDRODIURIL) 25 MG tablet take 1 tablet by mouth once daily 30 tablet 2  . levothyroxine (SYNTHROID, LEVOTHROID) 100 MCG tablet Take 100 mcg by mouth daily before breakfast.    . losartan (COZAAR) 100 MG tablet Take 0.5 tablets (50 mg total) by mouth daily. 30 tablet 2  . nystatin (MYCOSTATIN/NYSTOP) 100000 UNIT/GM POWD Apply twice daily as needed to affected area 30 g 0  . pravastatin (PRAVACHOL) 80 MG tablet Take 1 tablet (80 mg total) by mouth daily. 30 tablet 2  . amLODipine (NORVASC) 5 MG tablet Take 1 tablet (5 mg total) by mouth daily. 30 tablet 11   No current facility-administered medications for this visit.    Allergies:   Atorvastatin; Elavil; Hytrin; and Erythromycin   Social History:  The patient  reports that she has never smoked. She has never used smokeless tobacco. She reports that she drinks alcohol. She reports that she does not use illicit drugs.   Family History:  The patient's family history includes Cancer  in her mother; Dementia in her mother; Diabetes in her father and paternal grandmother; Hypertension in her paternal grandmother.    ROS:  Please see the history of present illness.  Otherwise, review of systems is positive for chest pain, depression, back pain, dizziness, and chest pressure.  All other systems are reviewed and negative.    PHYSICAL EXAM: VS:  BP 160/92 mmHg  Pulse 60  Ht 5' 5.5" (1.664 m)  Wt 155 lb 6.4 oz (70.489 kg)  BMI 25.46 kg/m2 , BMI Body mass index is 25.46 kg/(m^2). GEN:  Well nourished, well developed, in no acute distress HEENT: normal Neck: no JVD, carotid bruits, or masses Cardiac: Regular rate and rhythm with grade 2/6 early peaking systolic ejection murmur at the right upper sternal border                No peripheral edema Respiratory:  clear to auscultation bilaterally, normal work of breathing GI: soft, nontender, nondistended, + BS MS: no deformity or atrophy Skin: warm and dry, no rash Neuro:  Strength and sensation are intact Psych: euthymic mood, full affect  EKG:  EKG is not ordered today.  Recent Labs: 07/03/2013: ALT 15 07/05/2013: Magnesium 2.5 07/07/2013: Hemoglobin 9.1*; Platelets 209 10/22/2013: BUN 35*; Creatinine 1.23*; Potassium 4.6; Sodium 135   Lipid Panel     Component Value Date/Time   CHOL 305* 10/22/2013 1423   TRIG 280* 10/22/2013 1423   HDL 43 10/22/2013 1423   CHOLHDL 7.1 10/22/2013 1423   VLDL 56* 10/22/2013 1423   LDLCALC 206* 10/22/2013 1423      Wt Readings from Last 3 Encounters:  04/02/14 155 lb 6.4 oz (70.489 kg)  12/23/13 153 lb (69.4 kg)  12/18/13 151 lb 1.9 oz (68.548 kg)    ASSESSMENT AND PLAN: 1.  CAD, native vessel, with symptoms of angina. The patient's medical program was reviewed. She has chest pain with both typical and atypical features. I went back and looked at her nuclear stress test preceding cardiac surgery and this was clearly abnormal. Now that she has been revascularized and is having recurrent symptoms, I think a repeat stress study as indicated. Recommend an exercise Myoview scan for further evaluation.  2. Essential hypertension, uncontrolled. Reviewed sodium restriction which she is compliant with. Discussed the importance of weight loss. Recommend add amlodipine 5 mg at bedtime. Continue other medications without change.  3. Hyperlipidemia: She is treated with pravastatin. Lipids have been followed by PCP.   Current medicines are reviewed with the patient today.  The patient does not  have concerns regarding medicines.  The following changes have been made:  Add amlodipine 5 mg daily  Labs/ tests ordered today include:   Orders Placed This Encounter  Procedures  . Myocardial Perfusion Imaging    Disposition:   FU with me in 6 months  Signed, Sherren Mocha, MD  04/02/2014 12:08 PM    [address] [place], [place],   [address] Phone: [phone]; Fax: [phone]

## 2014-04-02 NOTE — Patient Instructions (Signed)
Your physician has requested that you have an exercise stress myoview. For further information please visit [email or web address]. Please follow instruction sheet, as given.  Your physician has recommended you make the following change in your medication:  1. START Amlodipine 5mg  take one tablet by mouth at bedtime  Your physician wants you to follow-up in: 6 MONTHS with Dr Burt Knack.  You will receive a reminder letter in the mail two months in advance. If you don't receive a letter, please call our office to schedule the follow-up appointment.

## 2014-04-07 ENCOUNTER — Ambulatory Visit (HOSPITAL_COMMUNITY): Payer: Medicare Other | Attending: Cardiology | Admitting: Radiology

## 2014-04-07 VITALS — BP 150/86 | HR 56 | Ht 65.5 in | Wt 150.0 lb

## 2014-04-07 DIAGNOSIS — I10 Essential (primary) hypertension: Secondary | ICD-10-CM | POA: Diagnosis not present

## 2014-04-07 DIAGNOSIS — R1084 Generalized abdominal pain: Secondary | ICD-10-CM | POA: Insufficient documentation

## 2014-04-07 DIAGNOSIS — I25119 Atherosclerotic heart disease of native coronary artery with unspecified angina pectoris: Secondary | ICD-10-CM | POA: Diagnosis not present

## 2014-04-07 DIAGNOSIS — R0789 Other chest pain: Secondary | ICD-10-CM

## 2014-04-07 MED ORDER — TECHNETIUM TC 99M SESTAMIBI GENERIC - CARDIOLITE
10.0000 | Freq: Once | INTRAVENOUS | Status: AC | PRN
  Administered 2014-04-07: 10 via INTRAVENOUS

## 2014-04-07 MED ORDER — TECHNETIUM TC 99M SESTAMIBI GENERIC - CARDIOLITE
30.0000 | Freq: Once | INTRAVENOUS | Status: AC | PRN
  Administered 2014-04-07: 30 via INTRAVENOUS

## 2014-04-07 MED ORDER — REGADENOSON 0.4 MG/5ML IV SOLN
0.4000 mg | Freq: Once | INTRAVENOUS | Status: AC
  Administered 2014-04-07: 0.4 mg via INTRAVENOUS

## 2014-04-07 MED ORDER — AMINOPHYLLINE 25 MG/ML IV SOLN
75.0000 mg | Freq: Once | INTRAVENOUS | Status: AC
  Administered 2014-04-07: 75 mg via INTRAVENOUS

## 2014-04-07 NOTE — Progress Notes (Signed)
[place], [place] [address] [phone]    Cardiology Nuclear Med Study  Amanda [place] is a 79 y.o. female     MRN : [id number]     DOB: June 13, 1935  Procedure Date: 04/07/2014  Nuclear Med Background Indication for Stress Test:  Evaluation for Ischemia and Graft Patency History:  CAD, CABG, MPI 2015 (ischemia) EF 73% Cardiac Risk Factors: Carotid Disease and Hypertension  Symptoms:  Chest Pain   Nuclear Pre-Procedure Caffeine/Decaff Intake:  None> 12 hrs NPO After: 9:00pm   Lungs:  clear O2 Sat: 100% on room air. IV 0.9% NS with Angio Cath:  22g  IV Site: R Antecubital x 1, tolerated well IV Started by:  Irven Baltimore, RN  Chest Size (in):  38 Cup Size: D  Height: 5' 5.5" (1.664 m)  Weight:  150 lb (68.04 kg)  BMI:  Body mass index is 24.57 kg/(m^2). Tech Comments:  Patient took Cozaar and Hctz this am per patient. Irven Baltimore, RN.    Nuclear Med Study 1 or 2 day study: 1 day  Stress Test Type:  Treadmill/Lexiscan  Reading MD: N/A  Order Authorizing Provider:  Sherren Mocha, MD  Resting Radionuclide: Technetium 48m Sestamibi  Resting Radionuclide Dose: 11.0 mCi   Stress Radionuclide:  Technetium 38m Sestamibi  Stress Radionuclide Dose: 33.0 mCi           Stress Protocol Rest HR: 56 Stress HR: 110  Rest BP: 150/86 Stress BP: 121/71  Exercise Time (min): n/a METS: n/a   Predicted Max HR: 143 bpm % Max HR: 76.92 bpm Rate Pressure Product: 16610   Dose of Adenosine (mg):  n/a Dose of Lexiscan: 0.4 mg  Dose of Atropine (mg): n/a Dose of Dobutamine: n/a mcg/kg/min (at max HR)  Stress Test Technologist: Glade Lloyd, BS-ES  Nuclear Technologist:  Earl Many, CNMT     Rest Procedure:  Myocardial perfusion imaging was performed at rest 45 minutes following the intravenous administration of Technetium 10m Sestamibi. Rest ECG: Normal sinus rhythm. Diffuse nonspecific ST flattening  Stress Procedure:  The  patient received IV Lexiscan 0.4 mg over 15-seconds with concurrent low level exercise and then Technetium 53m Sestamibi was injected at 30-seconds while the patient continued walking one more minute.  Quantitative spect images were obtained after a 45-minute delay.  Attempted to walk patient on Bruce protocol but she became very fatigued before reaching THR.  Switched to a low level Lexiscan.  During the infusion the patient complained of fatigue and stabbing stomach pain.  Administered 75 mg aminophylline and the patient began to feel much better.  Stress ECG: No significant change from baseline ECG  QPS Raw Data Images:  Normal; no motion artifact; normal heart/lung ratio. Stress Images:  There is a large area of moderate decreased uptake affecting the mid anterior segment, base/mid anterolateral segment, apical lateral segment, base/mid inferolateral segment. These segments are associated primarily with the anterolateral wall in the inferolateral wall. These abnormalities are seen primarily on the quantitative analysis. They are not seen well on the tomographic images. Rest Images:  Small medium area of very mild decreased uptake at the mid anterolateral segment and the base/mid inferolateral segments./ Subtraction (SDS):  There is mild /moderate reversibility in the mid anterior segment, base/mid anterolateral segment, apical lateral segment, and mid inferolateral segment.  Transient Ischemic Dilatation (Normal <1.22):  0.77 Lung/Heart Ratio (Normal <0.45):  0.28  Quantitative Gated Spect Images QGS EDV:  48 ml  QGS ESV:  14 ml  Impression Exercise Capacity:  Lexiscan with low level exercise. BP Response:  Normal blood pressure response. Clinical Symptoms:  The patient had fatigue and stabbing stomach pain. Patient was given IV Aminophyllin ECG Impression:  No significant ST segment change suggestive of ischemia. The EKGs are abnormal at rest and remained that way with stress.  Comparison  with Prior Nuclear Study: The study is compared with the report and the paper images from the study of February, 2015  Overall Impression:  This study is abnormal. It is a moderate risk study because of the question of the number of segments involved. The findings are unusual because the quantitative analysis suggests abnormalities that are not seen well on the tomographic images. These abnormalities occur in the same areas that showed definite ischemia on the study of February, 2015. The abnormalities were clearly seen on the tomographic images in February, 2015. Overall, consideration has to be given to the possibility of mild ischemia affecting the anterolateral wall in the inferolateral wall.  LV Ejection Fraction: 71%.  LV Wall Motion:  Normal Wall Motion   Daryel November, MD

## 2014-04-09 ENCOUNTER — Encounter: Payer: Self-pay | Admitting: Family Medicine

## 2014-04-09 ENCOUNTER — Ambulatory Visit (INDEPENDENT_AMBULATORY_CARE_PROVIDER_SITE_OTHER): Payer: Medicare Other | Admitting: Family Medicine

## 2014-04-09 VITALS — BP 150/96 | HR 73 | Temp 97.9°F | Wt 157.0 lb

## 2014-04-09 DIAGNOSIS — R300 Dysuria: Secondary | ICD-10-CM

## 2014-04-09 DIAGNOSIS — N390 Urinary tract infection, site not specified: Secondary | ICD-10-CM

## 2014-04-09 LAB — POCT URINALYSIS DIPSTICK
Bilirubin, UA: NEGATIVE
Glucose, UA: NEGATIVE
KETONES UA: NEGATIVE
Nitrite, UA: NEGATIVE
PROTEIN UA: NEGATIVE
Urobilinogen, UA: 2
pH, UA: 6

## 2014-04-09 MED ORDER — CIPROFLOXACIN HCL 250 MG PO TABS: 250.0000 mg | ORAL_TABLET | Freq: Two times a day (BID) | ORAL | Status: DC

## 2014-04-09 NOTE — Progress Notes (Signed)
  PCP:O'SULLIVAN,MELISSA S., NP Chief Complaint  Patient presents with  . Urinary Tract Infection    c/o burning in the vaginal area with nausea x's 2 weeks    Current Issues:  Presents with 14 days of dysuria and urinary frequency Associated symptoms include:  dysuria and urinary frequency  There is no history of of similar symptoms. Sexually active:  No   No concern for STI.  Prior to Admission medications   Medication Sig Start Date End Date Taking? Authorizing Provider  acetaminophen (TYLENOL) 325 MG tablet Take 2 tablets (650 mg total) by mouth every 6 (six) hours as needed for mild pain. 07/09/13  Yes Gina L Collins, PA-C  amLODipine (NORVASC) 5 MG tablet Take 1 tablet (5 mg total) by mouth daily. 04/02/14  Yes Blane Ohara, MD  aspirin EC 81 MG tablet Take 81 mg by mouth daily.   Yes Historical Provider, MD  Fluticasone Propionate (FLONASE NA) Place 1 spray into the nose daily as needed (for allergies).    Yes Historical Provider, MD  hydrochlorothiazide (HYDRODIURIL) 25 MG tablet take 1 tablet by mouth once daily 01/12/14  Yes Debbrah Alar, NP  levothyroxine (SYNTHROID, LEVOTHROID) 100 MCG tablet Take 100 mcg by mouth daily before breakfast.   Yes Historical Provider, MD  losartan (COZAAR) 100 MG tablet Take 0.5 tablets (50 mg total) by mouth daily. 12/23/13  Yes Debbrah Alar, NP  nystatin (MYCOSTATIN/NYSTOP) 100000 UNIT/GM POWD Apply twice daily as needed to affected area 12/23/13  Yes Debbrah Alar, NP  pravastatin (PRAVACHOL) 80 MG tablet Take 1 tablet (80 mg total) by mouth daily. 12/23/13  Yes Debbrah Alar, NP    Review of Systems:see above  PE:  BP 150/96 mmHg  Pulse 73  Temp(Src) 97.9 F (36.6 C) (Oral)  Wt 157 lb (71.215 kg)  SpO2 98% Constitutional== no fever, no chills Heart-- S1S2  Lungs-- ctab/l  Back--no flank pain Abdomen--soft Pelvic--not done  Results for orders placed or performed in visit on 04/09/14  POCT Urinalysis  Dipstick  Result Value Ref Range   Color, UA Yellow    Clarity, UA Cloudy    Glucose, UA Neg    Bilirubin, UA Neg    Ketones, UA Neg    Spec Grav, UA >=1.030    Blood, UA Small    pH, UA 6.0    Protein, UA Neg    Urobilinogen, UA 2.0    Nitrite, UA Neg    Leukocytes, UA small (1+)     Assessment and Plan:  1. Dysuria cipro--250 bid x 3 days- POCT Urinalysis Dipstick - Urine Culture

## 2014-04-09 NOTE — Patient Instructions (Signed)

## 2014-04-09 NOTE — Progress Notes (Signed)
Pre visit review using our clinic review tool, if applicable. No additional management support is needed unless otherwise documented below in the visit note. 

## 2014-04-10 LAB — URINE CULTURE
Colony Count: NO GROWTH
Organism ID, Bacteria: NO GROWTH

## 2014-04-14 ENCOUNTER — Ambulatory Visit: Payer: Medicare Other | Admitting: Family

## 2014-04-16 ENCOUNTER — Telehealth: Payer: Self-pay | Admitting: Cardiovascular Disease

## 2014-04-16 ENCOUNTER — Ambulatory Visit (INDEPENDENT_AMBULATORY_CARE_PROVIDER_SITE_OTHER): Payer: Medicare Other | Admitting: Family

## 2014-04-16 ENCOUNTER — Other Ambulatory Visit (HOSPITAL_COMMUNITY)
Admission: RE | Admit: 2014-04-16 | Discharge: 2014-04-16 | Disposition: A | Discharge status: Home / Self Care | Payer: Medicare Other | Source: Ambulatory Visit | Attending: Family | Admitting: Family

## 2014-04-16 ENCOUNTER — Encounter: Payer: Self-pay | Admitting: Family

## 2014-04-16 VITALS — BP 176/89 | HR 63 | Temp 98.0°F | Ht 65.5 in | Wt 155.4 lb

## 2014-04-16 DIAGNOSIS — I10 Essential (primary) hypertension: Secondary | ICD-10-CM

## 2014-04-16 DIAGNOSIS — E119 Type 2 diabetes mellitus without complications: Secondary | ICD-10-CM

## 2014-04-16 DIAGNOSIS — E785 Hyperlipidemia, unspecified: Secondary | ICD-10-CM

## 2014-04-16 DIAGNOSIS — E039 Hypothyroidism, unspecified: Secondary | ICD-10-CM

## 2014-04-16 DIAGNOSIS — R300 Dysuria: Secondary | ICD-10-CM

## 2014-04-16 DIAGNOSIS — N760 Acute vaginitis: Secondary | ICD-10-CM | POA: Diagnosis present

## 2014-04-16 LAB — MICROALBUMIN / CREATININE URINE RATIO
Creatinine,U: 53.2 mg/dL
Microalb Creat Ratio: 1.3 mg/g (ref 0.0–30.0)

## 2014-04-16 LAB — GLUCOSE, POCT (MANUAL RESULT ENTRY): POC Glucose: 113 mg/dl — AB (ref 70–99)

## 2014-04-16 LAB — HEMOGLOBIN A1C: Hgb A1c MFr Bld: 7 % — ABNORMAL HIGH (ref 4.6–6.5)

## 2014-04-16 MED ORDER — AMLODIPINE BESYLATE 10 MG PO TABS: 10.0000 mg | ORAL_TABLET | Freq: Every day | ORAL | Status: DC

## 2014-04-16 NOTE — Telephone Encounter (Signed)
Follow UP  Pt calling to discuss results to test. Plaease call back and discuss.

## 2014-04-16 NOTE — Assessment & Plan Note (Signed)
Uncontrolled, increase amlodipine from 5mg to 10mg.  

## 2014-04-16 NOTE — Progress Notes (Signed)
Subjective:    Patient ID: Amanda Curtis, female    DOB: May 01, 1935, 79 y.o.   MRN: [id number]  HPI Amanda Curtis is here today for follow up for multiple medical conditions. She was treated with cipro for UTI on 04/09/2014. Reports she finished but is still having burning with urination.  She reports feeling depressed since heart surgery. Doesn't like toi idea of taking medication-has tried in past and didn't like the way she felt.  1. DM: Not checking BS-machine is broken. Last one checked was 2 weeks ago and it was 126. Diet: not doing well with diet. She did very well with Heart Stride program for diet/exercise post CABG. Still has some sweets and is eating more fat than she should. Exercise: not exercising.  Eye exam: she is due Lab Results  Component Value Date   HGBA1C 6.7* 12/23/2013   HGBA1C 6.4* 06/30/2013   HGBA1C 6.6* 02/07/2013   Lab Results  Component Value Date   MICROALBUR 1.44 02/07/2013   LDLCALC 206* 10/22/2013   CREATININE 1.23* 10/22/2013    2. HYPERTENSION: Reports compliance with losartan and hctz. Dr. Copper added amlodipine 5 mg 04/02/2014. Reports incisional chest pain, dyspnea, or lower extremity edema. Reports legs feel heavy. BP Readings from Last 3 Encounters:  04/16/14 176/89  04/09/14 150/96  04/07/14 150/86   3. Hyperlipidemia: Reports compliance with pravastatin. Has occasional myalgia. Lab Results  Component Value Date   CHOL 305* 10/22/2013   HDL 43 10/22/2013   LDLCALC 206* 10/22/2013   TRIG 280* 10/22/2013   CHOLHDL 7.1 10/22/2013   4. Hypothyroidism: Reports positive compliance with levothyroxine.  Lab Results  Component Value Date   TSH 1.646 03/25/2013    Review of Systems Past Medical History  Diagnosis Date  . Hypertension   . Arthritis   . Graves disease 2000    s/p RIA  . Diabetes mellitus without complication   . Insomnia     History   Social History  . Marital Status: Widowed    Spouse Name: N/A  . Number  of Children: N/A  . Years of Education: N/A   Occupational History  . Not on file.   Social History Main Topics  . Smoking status: Never Smoker   . Smokeless tobacco: Never Used  . Alcohol Use: Yes     Comment: 1 glass wine every 6 months  . Drug Use: No  . Sexual Activity: Not on file   Other Topics Concern  . Not on file   Social History Narrative   Widow   Retired worked for [employer] and did a lot of travelling for [employer].   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   She has 1 son- lives in [place]   3 grandsons- college age (one is in [place])             Past Surgical History  Procedure Laterality Date  . Appendectomy    . Nasal septum surgery      childhood  . Intraoperative transesophageal echocardiogram N/A 07/04/2013    Procedure: INTRAOPERATIVE TRANSESOPHAGEAL ECHOCARDIOGRAM;  Surgeon: Gaye Pollack, MD;  Location: [facility] OR;  Service: Open Heart Surgery;  Laterality: N/A;  . Coronary artery bypass graft N/A 07/04/2013    Procedure: CORONARY ARTERY BYPASS GRAFTING (CABG) times four using left internal mammary artery and right saphenous leg vein.;  Surgeon: Gaye Pollack, MD;  Location: [facility]  OR;  Service: Open Heart Surgery;  Laterality: N/A;  . Left heart catheterization with coronary angiogram N/A 06/30/2013    Procedure: LEFT HEART CATHETERIZATION WITH CORONARY ANGIOGRAM;  Surgeon: Blane Ohara, MD;  Location: [facility] CATH LAB;  Service: Cardiovascular;  Laterality: N/A;    Family History  Problem Relation Age of Onset  . Dementia Mother   . Cancer Mother     breast  . Diabetes Father   . Diabetes Paternal Grandmother   . Hypertension Paternal Grandmother     Allergies  Allergen Reactions  . Atorvastatin Other (See Comments)    Severe muscle cramping  . Elavil [Amitriptyline] Other (See Comments)    Tongue swelling  . Hytrin [Terazosin] Other (See Comments)    syncope  .  Erythromycin Rash    Current Outpatient Prescriptions on File Prior to Visit  Medication Sig Dispense Refill  . acetaminophen (TYLENOL) 325 MG tablet Take 2 tablets (650 mg total) by mouth every 6 (six) hours as needed for mild pain.    [name] amLODipine (NORVASC) 5 MG tablet Take 1 tablet (5 mg total) by mouth daily. 30 tablet 11  . aspirin EC 81 MG tablet Take 81 mg by mouth daily.    . Fluticasone Propionate (FLONASE NA) Place 1 spray into the nose daily as needed (for allergies).     . hydrochlorothiazide (HYDRODIURIL) 25 MG tablet take 1 tablet by mouth once daily 30 tablet 2  . levothyroxine (SYNTHROID, LEVOTHROID) 100 MCG tablet Take 100 mcg by mouth daily before breakfast.    . losartan (COZAAR) 100 MG tablet Take 0.5 tablets (50 mg total) by mouth daily. 30 tablet 2  . pravastatin (PRAVACHOL) 80 MG tablet Take 1 tablet (80 mg total) by mouth daily. 30 tablet 2   No current facility-administered medications on file prior to visit.    BP 176/89 mmHg  Pulse 63  Temp(Src) 98 F (36.7 C) (Oral)  Ht 5' 5.5" (1.664 m)  Wt 155 lb 6.4 oz (70.489 kg)  BMI 25.46 kg/m2  SpO2 98%      Objective:   Physical Exam  Constitutional: She is oriented to person, place, and time. She appears well-developed and well-nourished.  HENT:  Head: Normocephalic and atraumatic.  Cardiovascular: Normal rate, regular rhythm and normal heart sounds.   No murmur heard. Pulmonary/Chest: Effort normal and breath sounds normal. No respiratory distress. She has no wheezes.  Neurological: She is alert and oriented to person, place, and time.  Psychiatric: She has a normal mood and affect. Her behavior is normal. Judgment and thought content normal.          Assessment & Plan:

## 2014-04-16 NOTE — Assessment & Plan Note (Signed)
Uncontrolled, refer to lipid clinic

## 2014-04-16 NOTE — Assessment & Plan Note (Signed)
Clinically stable, obtain follow up tsh.

## 2014-04-16 NOTE — Progress Notes (Signed)
Pre visit review using our clinic review tool, if applicable. No additional management support is needed unless otherwise documented below in the visit note. 

## 2014-04-16 NOTE — Patient Instructions (Signed)
Increase amlodipine to 10mg  once daily. Complete lab work prior to leaving.  You will be contacted about your referral to lipid clinic.   Please contact [facility] to arrange an appointment with one of our therapists in [place]- [phone] Follow up in 1 month.

## 2014-04-16 NOTE — Telephone Encounter (Signed)
New Msg        Pt calling to get results of nuclear test.   Please return pt call.

## 2014-04-17 LAB — URINE CULTURE: Colony Count: 25000

## 2014-04-17 LAB — CERVICOVAGINAL ANCILLARY ONLY
WET PREP (BD AFFIRM): NEGATIVE
Wet Prep (BD Affirm): NEGATIVE
Wet Prep (BD Affirm): NEGATIVE

## 2014-04-17 NOTE — Telephone Encounter (Signed)
Follow up  ° ° °Patient calling for test results.  °

## 2014-04-17 NOTE — Telephone Encounter (Signed)
15 minute phone call. Dr Burt Knack reviewed [facility] and felt study was low risk. If the pt is still having a lot of chest pain then he would recommend a cardiac cath but if doing better would recommend medical therapy.  I spoke with the pt and made her aware of myoview results. At this time the pt feels like her symptoms are about the same and she does not want to have a cardiac catheterization.  The pt would like to continue to monitor her symptoms and if she has any worsening or increased frequency in symptoms then she will contact the office. The pt saw her PCP yesterday and her BP remained elevated and Amlodipine was increased to 10mg  daily. I will forward this information to Dr Burt Knack to make him aware of medication change.

## 2014-04-20 ENCOUNTER — Ambulatory Visit: Payer: Medicare Other | Admitting: Pharmacist

## 2014-04-21 ENCOUNTER — Other Ambulatory Visit: Payer: Self-pay | Admitting: Family

## 2014-04-21 ENCOUNTER — Ambulatory Visit (INDEPENDENT_AMBULATORY_CARE_PROVIDER_SITE_OTHER): Payer: Medicare Other | Admitting: Pharmacist

## 2014-04-21 DIAGNOSIS — E785 Hyperlipidemia, unspecified: Secondary | ICD-10-CM

## 2014-04-21 NOTE — Telephone Encounter (Signed)
Urine culture grew a small amount of bacteria.  If still having burning, start cipro 250mg  bid x 3 days #6.  Also, sugar is up slightly, need to resume exercise and continue work on diabetic diet.

## 2014-04-21 NOTE — Progress Notes (Signed)
Amanda Curtis is a pleasant 79 yo F referred by Dr. Burt Knack for lipid management. Her PMH is significant for CAD s/p CABG in May 2015, HTN, and DMII.  She has attempted use with simvastatin 40 mg daily, atorvastatin 20 mg and 80 mg daily which she did not tolerate due to muscle aches/pain. In October of 2015 she was taking pravastatin 40 mg daily but her LDL remained elevated at 206 so she was switched to Livalo 2 mg daily. Due to her inability to afford Livalo therapy, she was switched to pravastatin 80 mg daily. She states that she still has some weakness in her legs, shoulder and back area but she is unsure if that is from her surgery or the pravastatin.   SH: No smoking or alcohol abuse  FH: Brother - CABG at 51 yo  Diet: 1 cup caffeinated black coffee, water, rarely hot tea   Breakfast: oatmeal with fruit, eggs   Lunch: salad, vegetables   Dinner: fish (salmon), chicken, vegetables  Exercise: No exercise currently (she is a member of [employer], but she is looking into another gym that has designated trainers who will measure vitals during exercise)  Was attending the Heart Stride program x 12 weeks which finished in December  Labs: 04/16/14: A1c 7.0%  10/22/13: Tchol 305, TG 280, HDL 43, LDL 206 (pravastatin 40 mg daily) 02/07/13: Tchol 243, TG 205, HDL 46, LDL 156 (no antilipemics)  Allergies  Allergen Reactions  . Atorvastatin Other (See Comments)    Severe muscle cramping  . Elavil [Amitriptyline] Other (See Comments)    Tongue swelling  . Hytrin [Terazosin] Other (See Comments)    syncope  . Erythromycin Rash   Prior to Admission medications   Medication Sig Start Date End Date Taking? Authorizing Provider  acetaminophen (TYLENOL) 325 MG tablet Take 2 tablets (650 mg total) by mouth every 6 (six) hours as needed for mild pain. 07/09/13  Yes Gina L Collins, PA-C  amLODipine (NORVASC) 10 MG tablet Take 1 tablet (10 mg total) by mouth daily. 04/16/14  Yes Debbrah Alar, NP   aspirin EC 81 MG tablet Take 81 mg by mouth daily.   Yes Historical Provider, MD  Fluticasone Propionate (FLONASE NA) Place 1 spray into the nose daily as needed (for allergies).    Yes Historical Provider, MD  hydrochlorothiazide (HYDRODIURIL) 25 MG tablet take 1 tablet by mouth once daily 01/12/14  Yes Debbrah Alar, NP  levothyroxine (SYNTHROID, LEVOTHROID) 100 MCG tablet Take 100 mcg by mouth daily before breakfast.   Yes Historical Provider, MD  losartan (COZAAR) 100 MG tablet Take 0.5 tablets (50 mg total) by mouth daily. 12/23/13  Yes Debbrah Alar, NP  pravastatin (PRAVACHOL) 80 MG tablet Take 1 tablet (80 mg total) by mouth daily. 12/23/13  Yes Debbrah Alar, NP      A/P: Based on Ms. Althouse's last lipid panel, she remains well above her goal LDL <70 and non-HDL <100. At the time of this lipid panel, she was on pravastatin 40 mg daily which was increased to pravastatin 80 mg daily. We do not have more recent lipid values to assess the effects of the higher dose of pravastatin, but this dose will likely not get her to her goal LDL anyway. She asked about the new cholesterol medications so we discussed PCSK9 inhibitor therapy which she does not yet qualify for, but she was resistant to the use of an injectable agent. We discussed transitioning to a higher potency statin like Crestor since  she didn't tolerate atorvastatin in the past or adding on Zetia therapy. Due to the cost associated with these agents, she would like to try lifestyle changes first. At this time, she will continue on pravastatin 80 mg daily as well as begin an exercise regimen at a new gym in her home town. We will have a fasting lipid panel drawn in about 1-2 months and see her in clinic to reassess a few days after that.   Ruta Hinds. Velva Harman, PharmD Clinical Pharmacist - Resident Pager: [phone] Pharmacy: [phone] 04/21/2014 12:24 PM

## 2014-04-21 NOTE — Patient Instructions (Signed)
It was a pleasure to meet you today!  Please continue to take your pravastatin 80 mg daily.  Start exercising and continue your diet rich in lean proteins and fiber.   Please get a fasting cholesterol panel drawn in early April and we will see you back in the clinic on April 8th.

## 2014-04-23 MED ORDER — CIPROFLOXACIN HCL 250 MG PO TABS: 250.0000 mg | ORAL_TABLET | Freq: Two times a day (BID) | ORAL | Status: DC

## 2014-04-23 NOTE — Telephone Encounter (Signed)
Notified pt and she voices understanding.  Rx sent. Pt concerned that one of her medications is causing her to get UTI? Pt also states that she forgot to mention a rash that she has had on her lower back x 1 month. Denies blister, pain or burning of rash area but notes that it is bumpy and itches. Pt states she is unsure what could be causing this (?med).  Please advise.

## 2014-04-23 NOTE — Telephone Encounter (Signed)
She will need OV for [place] to evaluate her rash.

## 2014-04-23 NOTE — Telephone Encounter (Signed)
Notified pt and she voices understanding. States that hydrocortisone cream does help with the itching and states she can live with the rash at this time. Pt states she will address this at her follow up on 05/15/14 unless symptoms worsen.

## 2014-04-30 ENCOUNTER — Telehealth: Payer: Self-pay | Admitting: *Deleted

## 2014-04-30 NOTE — Telephone Encounter (Signed)
Noted and agree. 

## 2014-04-30 NOTE — Telephone Encounter (Signed)
Patient called complaining of chest pain and requesting appointment with Debbrah Alar.  She feels that her symptoms are side effects of her medications and had questions regarding their side effects.  She is taking her Norvasc,but stopped taking HCTZ because she read not to take it during chest pain.  She was told by cardiologist (Dr. Burt Knack) that she would need another heart catheterization earlier this year.  Patient describes pain as pressure and on the left side going to left arm.  She states it has been consistent for 5 days and shortness of breath began yesterday.    Instructed patient to go to ED now and patient stated understanding and agreed.  She has reservations about what they will do for her in the ED vs what her insurance will cover, but agrees with plan.  She is in [place] and says she will go to ED there.

## 2014-05-15 ENCOUNTER — Ambulatory Visit (INDEPENDENT_AMBULATORY_CARE_PROVIDER_SITE_OTHER): Payer: Medicare Other | Admitting: Family

## 2014-05-15 ENCOUNTER — Encounter: Payer: Self-pay | Admitting: Family

## 2014-05-15 VITALS — BP 140/98 | HR 58 | Temp 98.2°F | Resp 16 | Ht 65.5 in | Wt 148.2 lb

## 2014-05-15 DIAGNOSIS — I2510 Atherosclerotic heart disease of native coronary artery without angina pectoris: Secondary | ICD-10-CM

## 2014-05-15 DIAGNOSIS — I798 Other disorders of arteries, arterioles and capillaries in diseases classified elsewhere: Secondary | ICD-10-CM

## 2014-05-15 DIAGNOSIS — I10 Essential (primary) hypertension: Secondary | ICD-10-CM

## 2014-05-15 DIAGNOSIS — E039 Hypothyroidism, unspecified: Secondary | ICD-10-CM

## 2014-05-15 DIAGNOSIS — E1151 Type 2 diabetes mellitus with diabetic peripheral angiopathy without gangrene: Secondary | ICD-10-CM

## 2014-05-15 LAB — BASIC METABOLIC PANEL
BUN: 25 mg/dL — ABNORMAL HIGH (ref 6–23)
CO2: 27 meq/L (ref 19–32)
Calcium: 9.4 mg/dL (ref 8.4–10.5)
Chloride: 104 mEq/L (ref 96–112)
Creatinine, Ser: 1.26 mg/dL — ABNORMAL HIGH (ref 0.40–1.20)
GFR: 43.75 mL/min — ABNORMAL LOW (ref >60.00)
Glucose, Bld: 94 mg/dL (ref 70–99)
Potassium: 5.1 mEq/L (ref 3.5–5.1)
SODIUM: 136 meq/L (ref 135–145)

## 2014-05-15 LAB — TSH: TSH: 1.16 u[IU]/mL (ref 0.35–4.50)

## 2014-05-15 MED ORDER — HYDROCHLOROTHIAZIDE 25 MG PO TABS: 25.0000 mg | ORAL_TABLET | Freq: Every day | ORAL | Status: DC

## 2014-05-15 NOTE — Progress Notes (Signed)
Pre visit review using our clinic review tool, if applicable. No additional management support is needed unless otherwise documented below in the visit note. 

## 2014-05-15 NOTE — Progress Notes (Signed)
Subjective:    Patient ID: Amanda Curtis, female    DOB: 06-20-1935, 79 y.o.   MRN: [id number]  HPI   Patient is currently maintained on the following medications for blood pressure: (amlodipine 10mg , increased from 5mg  last visit) reports that she ran out of hctz and then increased losartan from a 1/2 pill to a whole pill.  Notes that her legs "hurt on the amlodipine")  Patient reports good compliance with blood pressure medications. Patient denies chest pain, shortness of breath or swelling. Last 3 blood pressure readings in our office are as follows: BP Readings from Last 3 Encounters:  05/15/14 140/98  04/16/14 176/89  04/09/14 150/96   DM2- FBS 120-127, avoids carbs in her diet. Does some short walks and floor exercises, will schedule eye exam.   CAD- Dr. Burt Knack repeated [facility] they are monitoring her sx's of intermittent left chest pain. She tells me she does not wish to repeat her cardiac cath.    Review of Systems    see HPI  Past Medical History  Diagnosis Date  . Hypertension   . Arthritis   . Graves disease 2000    s/p RIA  . Diabetes mellitus without complication   . Insomnia     History   Social History  . Marital Status: Widowed    Spouse Name: N/A  . Number of Children: N/A  . Years of Education: N/A   Occupational History  . Not on file.   Social History Main Topics  . Smoking status: Never Smoker   . Smokeless tobacco: Never Used  . Alcohol Use: Yes     Comment: 1 glass wine every 6 months  . Drug Use: No  . Sexual Activity: Not on file   Other Topics Concern  . Not on file   Social History Narrative   Widow   Retired worked for [employer] and did a lot of travelling for [employer].   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   She has 1 son- lives in [place]   3 grandsons- college age (one is in [place])             Past Surgical History  Procedure  Laterality Date  . Appendectomy    . Nasal septum surgery      childhood  . Intraoperative transesophageal echocardiogram N/A 07/04/2013    Procedure: INTRAOPERATIVE TRANSESOPHAGEAL ECHOCARDIOGRAM;  Surgeon: Gaye Pollack, MD;  Location: [facility] OR;  Service: Open Heart Surgery;  Laterality: N/A;  . Coronary artery bypass graft N/A 07/04/2013    Procedure: CORONARY ARTERY BYPASS GRAFTING (CABG) times four using left internal mammary artery and right saphenous leg vein.;  Surgeon: Gaye Pollack, MD;  Location: [facility] OR;  Service: Open Heart Surgery;  Laterality: N/A;  . Left heart catheterization with coronary angiogram N/A 06/30/2013    Procedure: LEFT HEART CATHETERIZATION WITH CORONARY ANGIOGRAM;  Surgeon: Blane Ohara, MD;  Location: [facility] CATH LAB;  Service: Cardiovascular;  Laterality: N/A;    Family History  Problem Relation Age of Onset  . Dementia Mother   . Cancer Mother     breast  . Diabetes Father   . Diabetes Paternal Grandmother   . Hypertension Paternal Grandmother     Allergies  Allergen Reactions  . Atorvastatin Other (See Comments)    Severe muscle cramping  . Elavil [Amitriptyline] Other (See Comments)    Tongue swelling  .  Hytrin [Terazosin] Other (See Comments)    syncope  . Erythromycin Rash    Current Outpatient Prescriptions on File Prior to Visit  Medication Sig Dispense Refill  . acetaminophen (TYLENOL) 325 MG tablet Take 2 tablets (650 mg total) by mouth every 6 (six) hours as needed for mild pain.    [name] amLODipine (NORVASC) 10 MG tablet Take 1 tablet (10 mg total) by mouth daily. 30 tablet 2  . aspirin EC 81 MG tablet Take 81 mg by mouth daily.    . Fluticasone Propionate (FLONASE NA) Place 1 spray into the nose daily as needed (for allergies).     [name] levothyroxine (SYNTHROID, LEVOTHROID) 100 MCG tablet Take 100 mcg by mouth daily before breakfast.    . losartan (COZAAR) 100 MG tablet Take 0.5 tablets (50 mg total) by mouth daily. 30 tablet 2  . pravastatin  (PRAVACHOL) 80 MG tablet Take 1 tablet (80 mg total) by mouth daily. 30 tablet 2   No current facility-administered medications on file prior to visit.    BP 140/98 mmHg  Pulse 58  Temp(Src) 98.2 F (36.8 C) (Oral)  Resp 16  Ht 5' 5.5" (1.664 m)  Wt 148 lb 3.2 oz (67.223 kg)  BMI 24.28 kg/m2  SpO2 99%    Objective:   Physical Exam  Constitutional: She is oriented to person, place, and time. She appears well-developed and well-nourished.  Cardiovascular: Normal rate, regular rhythm and normal heart sounds.   No murmur heard. Pulmonary/Chest: Effort normal and breath sounds normal. No respiratory distress. She has no wheezes.  Musculoskeletal: She exhibits no edema.  Neurological: She is alert and oriented to person, place, and time.  Psychiatric: She has a normal mood and affect. Her behavior is normal. Judgment and thought content normal.          Assessment & Plan:

## 2014-05-15 NOTE — Patient Instructions (Signed)
Restart HCTZ once daily. Continue losartan (cozaar) 100mg  once daily. Follow up in 1 month for nurse visit BP recheck with labs (BMET) Follow up in 3 months for a routine office visit.

## 2014-05-16 ENCOUNTER — Encounter: Payer: Self-pay | Admitting: Family

## 2014-05-16 NOTE — Assessment & Plan Note (Signed)
Lab Results  Component Value Date   HGBA1C 7.0* 04/16/2014   At goal. Advised pt to schedule eye exam.

## 2014-05-16 NOTE — Assessment & Plan Note (Signed)
Defer management to cardiology

## 2014-05-16 NOTE — Assessment & Plan Note (Signed)
Uncontrolled. Resume hctz, continue amlodipine 10mg  + cozaar 100mg .  Follow up in 1 month for bp recheck and bmet.

## 2014-05-22 ENCOUNTER — Other Ambulatory Visit: Payer: Self-pay | Admitting: Family

## 2014-05-25 ENCOUNTER — Other Ambulatory Visit: Payer: Self-pay | Admitting: Family

## 2014-05-26 ENCOUNTER — Other Ambulatory Visit: Payer: Self-pay | Admitting: *Deleted

## 2014-05-26 ENCOUNTER — Telehealth: Payer: Self-pay | Admitting: Family

## 2014-05-26 MED ORDER — LOSARTAN POTASSIUM 100 MG PO TABS: 100.0000 mg | ORAL_TABLET | Freq: Every day | ORAL | Status: DC

## 2014-05-26 NOTE — Telephone Encounter (Signed)
Gave verbal for RN to send in 100 mg tablet.  1 tablet daily.

## 2014-05-26 NOTE — Telephone Encounter (Signed)
Rx refill sent for Losartan 100MG .  Previous signature stated "take 1/2 tablet daily" so when previously refilled today this signature was on bottle.  Patient called concerned stating she is taking a full tab daily.  Per last office visit:  Essential hypertension - Debbrah Alar, NP at 05/16/2014 11:14 PM     Status: Written Related Problem: Essential hypertension   Expand All Collapse All   Uncontrolled. Resume hctz, continue amlodipine 10mg  + cozaar 100mg . Follow up in 1 month for bp recheck and bmet.       Per Einar Pheasant, [place] to send full dose since this is what patient has been taking and this was last order.

## 2014-05-26 NOTE — Telephone Encounter (Signed)
Advise on this sig on losartan as on medication list instructions state to take 1/2 daily.  This was discussed at her OV on 05/15/14. Advise please

## 2014-05-26 NOTE — Telephone Encounter (Signed)
Caller name: Tamsin Relation to pt: self Call back number: [phone] Pharmacy: [facility] on [address]  Reason for call:   Patient states that the wrong dosage of losartan was called in. She states that 1/2 tablet was called in but she take one daily. She is out.

## 2014-05-26 NOTE — Telephone Encounter (Signed)
RN spoke to the patient regarding refill.

## 2014-06-05 ENCOUNTER — Ambulatory Visit: Payer: Medicare Other | Admitting: Pharmacist

## 2014-06-30 ENCOUNTER — Other Ambulatory Visit: Payer: Self-pay | Admitting: Family

## 2014-06-30 NOTE — Telephone Encounter (Signed)
30 day supply losartan sent to pharmacy. Pt was due for nurse visit BP check on 06/15/14 and has not scheduled. She is also due for 3 month f/u on or after 08/15/14. Please call pt to arrange appointments.  Thanks!

## 2014-06-30 NOTE — Telephone Encounter (Signed)
Left message for patient to return my call.

## 2014-07-01 NOTE — Telephone Encounter (Signed)
Nurse appointment has been scheduled for 07/02/14

## 2014-07-02 ENCOUNTER — Ambulatory Visit (INDEPENDENT_AMBULATORY_CARE_PROVIDER_SITE_OTHER): Payer: Medicare Other | Admitting: Family Medicine

## 2014-07-02 VITALS — BP 146/82 | HR 56

## 2014-07-02 DIAGNOSIS — I10 Essential (primary) hypertension: Secondary | ICD-10-CM | POA: Diagnosis not present

## 2014-07-02 NOTE — Assessment & Plan Note (Signed)
Pt to f/u w/ PCP for additional adjustments

## 2014-07-02 NOTE — Progress Notes (Signed)
   Subjective:    Patient ID: Amanda Curtis, female    DOB: [birth year], 79 y.o.   MRN: [id number]  HPI HTN- pt returned for BP check w/ RN.  Asymptomatic.   Review of Systems For ROS see HPI     Objective:   Physical Exam  Constitutional: She appears well-developed and well-nourished. No distress.  Vitals reviewed.         Assessment & Plan:

## 2014-07-02 NOTE — Progress Notes (Signed)
Pre visit review using our clinic review tool, if applicable. No additional management support is needed unless otherwise documented below in the visit note. 

## 2014-07-15 ENCOUNTER — Encounter: Payer: Self-pay | Admitting: Family

## 2014-07-15 ENCOUNTER — Ambulatory Visit (INDEPENDENT_AMBULATORY_CARE_PROVIDER_SITE_OTHER): Payer: Medicare Other | Admitting: Family

## 2014-07-15 ENCOUNTER — Ambulatory Visit (HOSPITAL_BASED_OUTPATIENT_CLINIC_OR_DEPARTMENT_OTHER)
Admission: RE | Admit: 2014-07-15 | Discharge: 2014-07-15 | Disposition: A | Discharge status: Home / Self Care | Payer: Medicare Other | Source: Ambulatory Visit | Attending: Family | Admitting: Family

## 2014-07-15 VITALS — BP 154/82 | HR 67 | Temp 98.1°F | Resp 16

## 2014-07-15 DIAGNOSIS — E785 Hyperlipidemia, unspecified: Secondary | ICD-10-CM | POA: Diagnosis not present

## 2014-07-15 DIAGNOSIS — I10 Essential (primary) hypertension: Secondary | ICD-10-CM | POA: Diagnosis not present

## 2014-07-15 DIAGNOSIS — R0781 Pleurodynia: Secondary | ICD-10-CM | POA: Diagnosis present

## 2014-07-15 MED ORDER — HYDRALAZINE HCL 10 MG PO TABS: 10.0000 mg | ORAL_TABLET | Freq: Three times a day (TID) | ORAL | Status: DC

## 2014-07-15 MED ORDER — PRAVASTATIN SODIUM 80 MG PO TABS: 80.0000 mg | ORAL_TABLET | Freq: Every day | ORAL | Status: DC

## 2014-07-15 NOTE — Progress Notes (Signed)
Pre visit review using our clinic review tool, if applicable. No additional management support is needed unless otherwise documented below in the visit note. 

## 2014-07-15 NOTE — Progress Notes (Signed)
Subjective:    Patient ID: Amanda Curtis, female    DOB: June 22, 1935, 79 y.o.   MRN: [id number]  HPI  Amanda Curtis is a 79 yr old female who presents today for follow up.  1) HTN- Stopped amlodipine and hctz due to severe leg cramping. Continues losartan. Pt reports that she had cramping so bad that she could "hardly walk."  She reports that leg pain has stopped since she stopped amlodipine and hctz.  BP Readings from Last 3 Encounters:  07/15/14 154/82  07/02/14 146/82  05/15/14 140/98   2) Hyperlipidemia- maintained on pravastatin. Last visit a referral was made to the lipid clinic.  Lab Results  Component Value Date   CHOL 305* 10/22/2013   HDL 43 10/22/2013   LDLCALC 206* 10/22/2013   TRIG 280* 10/22/2013   CHOLHDL 7.1 10/22/2013    3) Rib pain- reports that she sneezed the other day and then develops sharp pain beneath the left breast.  Continues to have pain/tenderness.    Review of Systems See HPI  Past Medical History  Diagnosis Date  . Hypertension   . Arthritis   . Graves disease 2000    s/p RIA  . Diabetes mellitus without complication   . Insomnia     History   Social History  . Marital Status: Widowed    Spouse Name: N/A  . Number of Children: N/A  . Years of Education: N/A   Occupational History  . Not on file.   Social History Main Topics  . Smoking status: Never Smoker   . Smokeless tobacco: Never Used  . Alcohol Use: Yes     Comment: 1 glass wine every 6 months  . Drug Use: No  . Sexual Activity: Not on file   Other Topics Concern  . Not on file   Social History Narrative   Widow   Retired worked for [employer] and did a lot of travelling for [employer].   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   She has 1 son- lives in [place]   3 grandsons- college age (one is in [place])             Past Surgical History  Procedure Laterality Date  .  Appendectomy    . Nasal septum surgery      childhood  . Intraoperative transesophageal echocardiogram N/A 07/04/2013    Procedure: INTRAOPERATIVE TRANSESOPHAGEAL ECHOCARDIOGRAM;  Surgeon: Gaye Pollack, MD;  Location: [facility] OR;  Service: Open Heart Surgery;  Laterality: N/A;  . Coronary artery bypass graft N/A 07/04/2013    Procedure: CORONARY ARTERY BYPASS GRAFTING (CABG) times four using left internal mammary artery and right saphenous leg vein.;  Surgeon: Gaye Pollack, MD;  Location: [facility] OR;  Service: Open Heart Surgery;  Laterality: N/A;  . Left heart catheterization with coronary angiogram N/A 06/30/2013    Procedure: LEFT HEART CATHETERIZATION WITH CORONARY ANGIOGRAM;  Surgeon: Blane Ohara, MD;  Location: [facility] CATH LAB;  Service: Cardiovascular;  Laterality: N/A;    Family History  Problem Relation Age of Onset  . Dementia Mother   . Cancer Mother     breast  . Diabetes Father   . Diabetes Paternal Grandmother   . Hypertension Paternal Grandmother     Allergies  Allergen Reactions  . Amlodipine Other (See Comments)    Severe leg cramps  . Atorvastatin Other (See Comments)  Severe muscle cramping  . Elavil [Amitriptyline] Other (See Comments)    Tongue swelling  . Hctz [Hydrochlorothiazide] Other (See Comments)    Severe leg cramps  . Hytrin [Terazosin] Other (See Comments)    syncope  . Erythromycin Rash    Current Outpatient Prescriptions on File Prior to Visit  Medication Sig Dispense Refill  . aspirin EC 81 MG tablet Take 81 mg by mouth daily.    [name] losartan (COZAAR) 100 MG tablet take 1 tablet by mouth once daily 30 tablet 0  . SYNTHROID 100 MCG tablet take 1 tablet by mouth every morning 30 tablet 5  . acetaminophen (TYLENOL) 325 MG tablet Take 2 tablets (650 mg total) by mouth every 6 (six) hours as needed for mild pain.    [name] Fluticasone Propionate (FLONASE NA) Place 1 spray into the nose daily as needed (for allergies).      No current facility-administered  medications on file prior to visit.    BP 154/82 mmHg  Pulse 67  Temp(Src) 98.1 F (36.7 C) (Oral)  Resp 16  Wt   SpO2 99%       Objective:   Physical Exam  Constitutional: She is oriented to person, place, and time. She appears well-developed and well-nourished.  HENT:  Head: Normocephalic and atraumatic.  Cardiovascular: Normal rate, regular rhythm and normal heart sounds.   No murmur heard. Pulmonary/Chest: Effort normal and breath sounds normal. No respiratory distress. She has no wheezes.  Musculoskeletal: She exhibits no edema.  + left anterior rib tenderness to palpation  Neurological: She is alert and oriented to person, place, and time.  Psychiatric: She has a normal mood and affect. Her behavior is normal. Judgment and thought content normal.          Assessment & Plan:  Rib Pain- X ray demonstrates no obvious rib fracture.

## 2014-07-15 NOTE — Patient Instructions (Addendum)
Please complete x ray on the first floor.  Add hydralazine. Restart pravastatin. Follow up in 1 month.

## 2014-07-16 ENCOUNTER — Telehealth: Payer: Self-pay | Admitting: Family

## 2014-07-16 NOTE — Telephone Encounter (Signed)
Caller name: Abigail, Teall Relation to pt: self  Call back number: [phone]   Reason for call:  Pt was informed 07/15/14 to call today regarding her ultra sound results.

## 2014-07-17 NOTE — Telephone Encounter (Signed)
Notified pt of rib xray result and she voices understanding.

## 2014-07-18 NOTE — Assessment & Plan Note (Signed)
Uncontrolled. Add hydralazine.

## 2014-07-18 NOTE — Assessment & Plan Note (Signed)
She is will to restart pravastatin.

## 2014-08-04 ENCOUNTER — Other Ambulatory Visit: Payer: Self-pay | Admitting: Family

## 2014-08-18 ENCOUNTER — Ambulatory Visit: Payer: Medicare Other | Admitting: Family

## 2014-08-19 ENCOUNTER — Telehealth: Payer: Self-pay | Admitting: Family

## 2014-08-19 NOTE — Telephone Encounter (Signed)
No charge. Thanks.  

## 2014-08-19 NOTE — Telephone Encounter (Signed)
Pt was no show 08/18/14 10:30am, pt rescheduled for 08/26/14, follow up 15, charge?

## 2014-08-26 ENCOUNTER — Encounter: Payer: Self-pay | Admitting: Family

## 2014-08-26 ENCOUNTER — Ambulatory Visit (INDEPENDENT_AMBULATORY_CARE_PROVIDER_SITE_OTHER): Payer: Medicare Other | Admitting: Family

## 2014-08-26 VITALS — BP 168/100 | HR 57 | Temp 97.7°F | Resp 18 | Ht 65.0 in | Wt 149.8 lb

## 2014-08-26 DIAGNOSIS — T63464A Toxic effect of venom of wasps, undetermined, initial encounter: Secondary | ICD-10-CM | POA: Diagnosis not present

## 2014-08-26 DIAGNOSIS — I10 Essential (primary) hypertension: Secondary | ICD-10-CM | POA: Diagnosis not present

## 2014-08-26 MED ORDER — AMLODIPINE BESYLATE 5 MG PO TABS: 5.0000 mg | ORAL_TABLET | Freq: Every day | ORAL | Status: DC

## 2014-08-26 NOTE — Patient Instructions (Addendum)
Continue losartan, restart amlodipine.You can apply hydrocortisone to left thumb as needed and use benadryl 25 mg every 6 hours as needed for itching/swelling. Call if the area becomes more swollen/red painful, or if it does not improve in the next few days. Please schedule a follow up appointment in 1 month for nurse visit. Follow up in 3 months for routine follow up with provider.

## 2014-08-26 NOTE — Assessment & Plan Note (Signed)
Uncontrolled. Resume low dose amlodipine.

## 2014-08-26 NOTE — Progress Notes (Signed)
Pre visit review using our clinic review tool, if applicable. No additional management support is needed unless otherwise documented below in the visit note. 

## 2014-08-26 NOTE — Progress Notes (Signed)
Subjective:    Patient ID: Amanda Curtis, female    DOB: 1935/07/27, 79 y.o.   MRN: [id number]  HPI  [name] is a 79 yr old female who presents today for follow up. Last visit it was recommended that she start hydralazine, amlodipine and cozaar.  Stopped the hydralazine due to dizziness. Felt like it was unsafe to continue.   She is currently taking losartan (notes some weakness/dizziness on this med) but it is generally tolerable.   Amanda Curtis is getting married in July.    Reports that she was stung by a wasp yesterday on her left thumb. Area is red/swollen, itching.  Wt Readings from Last 3 Encounters:  08/26/14 149 lb 12.8 oz (67.949 kg)  05/15/14 148 lb 3.2 oz (67.223 kg)  04/16/14 155 lb 6.4 oz (70.489 kg)     BP Readings from Last 3 Encounters:  08/26/14 168/100  07/15/14 154/82  07/02/14 146/82    Review of Systems See HPI  Past Medical History  Diagnosis Date  . Hypertension   . Arthritis   . Graves disease 2000    s/p RIA  . Diabetes mellitus without complication   . Insomnia     History   Social History  . Marital Status: Widowed    Spouse Name: N/A  . Number of Children: N/A  . Years of Education: N/A   Occupational History  . Not on file.   Social History Main Topics  . Smoking status: Never Smoker   . Smokeless tobacco: Never Used  . Alcohol Use: Yes     Comment: 1 glass wine every 6 months  . Drug Use: No  . Sexual Activity: Not on file   Other Topics Concern  . Not on file   Social History Narrative   Widow   Retired worked for [employer] and did a lot of travelling for [employer].   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   She has 1 son- lives in [place]   3 grandsons- college age (one is in [place])             Past Surgical History  Procedure Laterality Date  . Appendectomy    . Nasal septum surgery      childhood  . Intraoperative  transesophageal echocardiogram N/A 07/04/2013    Procedure: INTRAOPERATIVE TRANSESOPHAGEAL ECHOCARDIOGRAM;  Surgeon: Gaye Pollack, MD;  Location: [facility] OR;  Service: Open Heart Surgery;  Laterality: N/A;  . Coronary artery bypass graft N/A 07/04/2013    Procedure: CORONARY ARTERY BYPASS GRAFTING (CABG) times four using left internal mammary artery and right saphenous leg vein.;  Surgeon: Gaye Pollack, MD;  Location: [facility] OR;  Service: Open Heart Surgery;  Laterality: N/A;  . Left heart catheterization with coronary angiogram N/A 06/30/2013    Procedure: LEFT HEART CATHETERIZATION WITH CORONARY ANGIOGRAM;  Surgeon: Blane Ohara, MD;  Location: [facility] CATH LAB;  Service: Cardiovascular;  Laterality: N/A;    Family History  Problem Relation Age of Onset  . Dementia Mother   . Cancer Mother     breast  . Diabetes Father   . Diabetes Paternal Grandmother   . Hypertension Paternal Grandmother     Allergies  Allergen Reactions  . Amlodipine Other (See Comments)    Severe leg cramps  . Atorvastatin Other (See Comments)    Severe muscle cramping  . Elavil [Amitriptyline] Other (See Comments)  Tongue swelling  . Hctz [Hydrochlorothiazide] Other (See Comments)    Severe leg cramps  . Hytrin [Terazosin] Other (See Comments)    syncope  . Erythromycin Rash    Current Outpatient Prescriptions on File Prior to Visit  Medication Sig Dispense Refill  . acetaminophen (TYLENOL) 325 MG tablet Take 2 tablets (650 mg total) by mouth every 6 (six) hours as needed for mild pain.    [name] aspirin EC 81 MG tablet Take 81 mg by mouth daily.    . Fluticasone Propionate (FLONASE NA) Place 1 spray into the nose daily as needed (for allergies).     . losartan (COZAAR) 100 MG tablet take 1 tablet by mouth once daily 30 tablet 3  . pravastatin (PRAVACHOL) 80 MG tablet Take 1 tablet (80 mg total) by mouth daily. 30 tablet 5  . SYNTHROID 100 MCG tablet take 1 tablet by mouth every morning 30 tablet 5  . hydrALAZINE  (APRESOLINE) 10 MG tablet Take 1 tablet (10 mg total) by mouth 3 (three) times daily. (Patient not taking: Reported on 08/26/2014) 90 tablet 2   No current facility-administered medications on file prior to visit.    BP 168/100 mmHg  Pulse 57  Temp(Src) 97.7 F (36.5 C) (Oral)  Resp 18  Ht 5\' 5"  (1.651 m)  Wt 149 lb 12.8 oz (67.949 kg)  BMI 24.93 kg/m2  SpO2 98%        Objective:   Physical Exam  Constitutional: She is oriented to person, place, and time. She appears well-developed and well-nourished.  HENT:  Head: Normocephalic and atraumatic.  Cardiovascular: Normal rate, regular rhythm and normal heart sounds.   No murmur heard. Pulmonary/Chest: Effort normal and breath sounds normal. No respiratory distress. She has no wheezes.  Neurological: She is alert and oriented to person, place, and time.  Skin:  Mild swelling/erythema of left thumb noted  Psychiatric: She has a normal mood and affect. Her behavior is normal. Judgment and thought content normal.          Assessment & Plan:  Wasp sting- local reaction. Advised pt:  You can apply hydrocortisone to left thumb as needed and use benadryl 25 mg every 6 hours as needed for itching/swelling. Call if the area becomes more swollen/red painful, or if it does not improve in the next few days.

## 2014-08-27 ENCOUNTER — Other Ambulatory Visit: Payer: Self-pay | Admitting: Family

## 2014-08-27 NOTE — Telephone Encounter (Signed)
Rx printed, signed by NP, and faxed to [facility] per patient preference.

## 2014-10-01 ENCOUNTER — Ambulatory Visit (INDEPENDENT_AMBULATORY_CARE_PROVIDER_SITE_OTHER): Payer: Medicare Other | Admitting: Family Medicine

## 2014-10-01 ENCOUNTER — Encounter: Payer: Self-pay | Admitting: Family Medicine

## 2014-10-01 VITALS — BP 138/90 | HR 90 | Temp 98.0°F | Resp 16 | Wt 150.1 lb

## 2014-10-01 DIAGNOSIS — R319 Hematuria, unspecified: Secondary | ICD-10-CM | POA: Diagnosis not present

## 2014-10-01 DIAGNOSIS — R300 Dysuria: Secondary | ICD-10-CM

## 2014-10-01 DIAGNOSIS — N390 Urinary tract infection, site not specified: Secondary | ICD-10-CM | POA: Diagnosis not present

## 2014-10-01 DIAGNOSIS — R82998 Other abnormal findings in urine: Secondary | ICD-10-CM

## 2014-10-01 LAB — POCT URINALYSIS DIPSTICK
BILIRUBIN UA: NEGATIVE
Glucose, UA: NEGATIVE
Ketones, UA: NEGATIVE
NITRITE UA: NEGATIVE
Protein, UA: NEGATIVE
Spec Grav, UA: 1.025
Urobilinogen, UA: 0.2
pH, UA: 6

## 2014-10-01 MED ORDER — CIPROFLOXACIN HCL 500 MG PO TABS: 500.0000 mg | ORAL_TABLET | Freq: Two times a day (BID) | ORAL | Status: DC

## 2014-10-01 NOTE — Progress Notes (Signed)
Pre visit review using our clinic review tool, if applicable. No additional management support is needed unless otherwise documented below in the visit note. 

## 2014-10-01 NOTE — Assessment & Plan Note (Signed)
New to provider, recurrent issue for pt.  sxs and UA consistent w/ infxn.  Start abx and await cx results

## 2014-10-01 NOTE — Progress Notes (Signed)
   Subjective:    Patient ID: Amanda Curtis, female    DOB: 08-Mar-1935, 79 y.o.   MRN: [id number]  HPI Dysuria- 'i have a burning'.  sxs started 10 days ago.  Increased frequency.  + urgency.  Some suprapubic pressure.  No fevers.  No back pain.   Review of Systems For ROS see HPI     Objective:   Physical Exam  Constitutional: She is oriented to person, place, and time. She appears well-developed and well-nourished. No distress.  HENT:  Head: Normocephalic and atraumatic.  Abdominal: Soft. She exhibits no distension. There is no tenderness (no suprapubic or CVA tenderness).  Neurological: She is alert and oriented to person, place, and time.  Skin: Skin is warm and dry.  Psychiatric: She has a normal mood and affect. Her behavior is normal. Thought content normal.  Vitals reviewed.         Assessment & Plan:

## 2014-10-01 NOTE — Patient Instructions (Signed)
Follow up as needed We'll notify you of your urine culture results and make any changes if needed Increase your water intake! Start the Cipro twice daily- take w/ food Call with any questions or concerns Hang in there!!!

## 2014-11-30 ENCOUNTER — Encounter: Payer: Self-pay | Admitting: Family

## 2014-11-30 ENCOUNTER — Ambulatory Visit (INDEPENDENT_AMBULATORY_CARE_PROVIDER_SITE_OTHER): Payer: Medicare Other | Admitting: Family

## 2014-11-30 VITALS — BP 142/88 | HR 62 | Temp 97.5°F | Resp 16 | Ht 65.0 in | Wt 156.6 lb

## 2014-11-30 DIAGNOSIS — R300 Dysuria: Secondary | ICD-10-CM

## 2014-11-30 DIAGNOSIS — E785 Hyperlipidemia, unspecified: Secondary | ICD-10-CM

## 2014-11-30 DIAGNOSIS — R0789 Other chest pain: Secondary | ICD-10-CM | POA: Diagnosis not present

## 2014-11-30 DIAGNOSIS — E039 Hypothyroidism, unspecified: Secondary | ICD-10-CM

## 2014-11-30 DIAGNOSIS — E118 Type 2 diabetes mellitus with unspecified complications: Secondary | ICD-10-CM

## 2014-11-30 DIAGNOSIS — I10 Essential (primary) hypertension: Secondary | ICD-10-CM | POA: Diagnosis not present

## 2014-11-30 DIAGNOSIS — Z23 Encounter for immunization: Secondary | ICD-10-CM | POA: Diagnosis not present

## 2014-11-30 DIAGNOSIS — Z91030 Bee allergy status: Secondary | ICD-10-CM | POA: Insufficient documentation

## 2014-11-30 DIAGNOSIS — Z91038 Other insect allergy status: Secondary | ICD-10-CM

## 2014-11-30 MED ORDER — EPINEPHRINE 0.3 MG/0.3ML IJ SOAJ: 0.3000 mg | Freq: Once | INTRAMUSCULAR | Status: DC

## 2014-11-30 NOTE — Assessment & Plan Note (Signed)
BP stable on current meds, continue same.  

## 2014-11-30 NOTE — Progress Notes (Signed)
Subjective:    Patient ID: Amanda Curtis, female    DOB: Jun 27, 1935, 79 y.o.   MRN: [id number]  HPI  Amanda Curtis is a 79 yr old female who presents today for follow up.  1) HTN- She is maintained  BP Readings from Last 3 Encounters:  11/30/14 142/88  10/01/14 138/90  08/26/14 168/100   2) Atypical Chest pain- reports occasional pain at site of her sternal superrfical scar.  Pain if worse with bending forward.  Occasional left sided breast pain and numbness of the left breast. She has an appointment on 9/17.    3) Hyperlipidemia- she is maintained on pravastatin 80mg .   Lab Results  Component Value Date   CHOL 305* 10/22/2013   HDL 43 10/22/2013   LDLCALC 206* 10/22/2013   TRIG 280* 10/22/2013   CHOLHDL 7.1 10/22/2013    4) Bee sting-Reports that she had multiple wasp stings right hand over the summer and had subsequent severe swelling of her hand. Went to the ED and received rx for steroids.    5) Notes some dysuria- Dysuria occurs right before urination.   Review of Systems See HPI  Past Medical History  Diagnosis Date  . Hypertension   . Arthritis   . Graves disease 2000    s/p RIA  . Diabetes mellitus without complication ([place])   . Insomnia     Social History   Social History  . Marital Status: Widowed    Spouse Name: N/A  . Number of Children: N/A  . Years of Education: N/A   Occupational History  . Not on file.   Social History Main Topics  . Smoking status: Never Smoker   . Smokeless tobacco: Never Used  . Alcohol Use: Yes     Comment: 1 glass wine every 6 months  . Drug Use: No  . Sexual Activity: Not on file   Other Topics Concern  . Not on file   Social History Narrative   Widow   Retired worked for [employer] and did a lot of travelling for [employer].   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   She has 1 son- lives in [place]   3 grandsons- college  age (one is in [place])             Past Surgical History  Procedure Laterality Date  . Appendectomy    . Nasal septum surgery      childhood  . Intraoperative transesophageal echocardiogram N/A 07/04/2013    Procedure: INTRAOPERATIVE TRANSESOPHAGEAL ECHOCARDIOGRAM;  Surgeon: Gaye Pollack, MD;  Location: [facility] OR;  Service: Open Heart Surgery;  Laterality: N/A;  . Coronary artery bypass graft N/A 07/04/2013    Procedure: CORONARY ARTERY BYPASS GRAFTING (CABG) times four using left internal mammary artery and right saphenous leg vein.;  Surgeon: Gaye Pollack, MD;  Location: [facility] OR;  Service: Open Heart Surgery;  Laterality: N/A;  . Left heart catheterization with coronary angiogram N/A 06/30/2013    Procedure: LEFT HEART CATHETERIZATION WITH CORONARY ANGIOGRAM;  Surgeon: Blane Ohara, MD;  Location: [facility] CATH LAB;  Service: Cardiovascular;  Laterality: N/A;    Family History  Problem Relation Age of Onset  . Dementia Mother   . Cancer Mother     breast  . Diabetes Father   . Diabetes Paternal Grandmother   . Hypertension Paternal Grandmother     Allergies  Allergen Reactions  .  Ampicillin   . Atorvastatin Other (See Comments)    Severe muscle cramping  . Elavil [Amitriptyline] Other (See Comments)    Tongue swelling  . Hctz [Hydrochlorothiazide] Other (See Comments)    Severe leg cramps  . Hytrin [Terazosin] Other (See Comments)    syncope  . Wasp Venom Other (See Comments)    Hand swelling  . Erythromycin Rash    Current Outpatient Prescriptions on File Prior to Visit  Medication Sig Dispense Refill  . acetaminophen (TYLENOL) 325 MG tablet Take 2 tablets (650 mg total) by mouth every 6 (six) hours as needed for mild pain.    [name] ALPRAZolam (XANAX) 0.25 MG tablet take 1 tablet by mouth twice a day if needed for anxiety or sleep 45 tablet 0  . amLODipine (NORVASC) 5 MG tablet Take 1 tablet (5 mg total) by mouth daily. 30 tablet 3  . aspirin EC 81 MG tablet Take 81 mg by mouth  daily.    . Fluticasone Propionate (FLONASE NA) Place 1 spray into the nose daily as needed (for allergies).     . hydrALAZINE (APRESOLINE) 10 MG tablet Take 1 tablet (10 mg total) by mouth 3 (three) times daily. 90 tablet 2  . losartan (COZAAR) 100 MG tablet take 1 tablet by mouth once daily (Patient taking differently: Take 1/2 tablet twice a day.) 30 tablet 3  . pravastatin (PRAVACHOL) 80 MG tablet Take 1 tablet (80 mg total) by mouth daily. 30 tablet 5  . SYNTHROID 100 MCG tablet take 1 tablet by mouth every morning 30 tablet 5   No current facility-administered medications on file prior to visit.    BP 142/88 mmHg  Pulse 62  Temp(Src) 97.5 F (36.4 C) (Oral)  Resp 16  Ht 5\' 5"  (1.651 m)  Wt 156 lb 9.6 oz (71.033 kg)  BMI 26.06 kg/m2  SpO2 98%       Objective:   Physical Exam  Constitutional: She is oriented to person, place, and time. She appears well-developed and well-nourished.  Cardiovascular: Normal rate, regular rhythm and normal heart sounds.   No murmur heard. Pulmonary/Chest: Effort normal and breath sounds normal. No respiratory distress. She has no wheezes.  Musculoskeletal: She exhibits no edema.  Neurological: She is alert and oriented to person, place, and time.  Psychiatric: Her behavior is normal. Judgment and thought content normal.  Anxious appearing          Assessment & Plan:  Atypical CP-  Likely related to scar tissue from CABG.  Advised pt to keep upcoming appointment with Dr. Burt Knack. Go to ER if severe CP. EKG tracing is personally reviewed.  EKG notes NSR.  No acute changes. Appears unchanged compared to prior EKG.   Hypothyroid- continue synthroid, obtain TSH  Dysuria- obtain UA/Culture to rule out UTI  Hyperlipidemia- obtain lipid panel, continue statin.  DM2- obtain A1C.  Flu shot today

## 2014-11-30 NOTE — Patient Instructions (Signed)
Please complete lab work prior to leaving. We have sent an epi-pen to your pharmacy to use in case of recurrent bee/wasp sting with allergic reaction such as: tongue/lips swelling/shorntess of breath. Please keep your upcoming appointment with Dr. Burt Knack.

## 2014-11-30 NOTE — Assessment & Plan Note (Signed)
Pt given rx for epi pen.

## 2014-11-30 NOTE — Progress Notes (Signed)
Pre visit review using our clinic review tool, if applicable. No additional management support is needed unless otherwise documented below in the visit note. 

## 2014-12-01 LAB — URINALYSIS, ROUTINE W REFLEX MICROSCOPIC
BILIRUBIN URINE: NEGATIVE
Hgb urine dipstick: NEGATIVE
Ketones, ur: NEGATIVE
Nitrite: NEGATIVE
RBC / HPF: NONE SEEN (ref 0–?)
Specific Gravity, Urine: <=1.005 — AB (ref 1.000–1.030)
Total Protein, Urine: NEGATIVE
UROBILINOGEN UA: 0.2 (ref 0.0–1.0)
Urine Glucose: NEGATIVE
pH: 7 (ref 5.0–8.0)

## 2014-12-01 LAB — LIPID PANEL
CHOL/HDL RATIO: 6
CHOLESTEROL: 317 mg/dL — AB (ref 0–200)
HDL: 49.7 mg/dL (ref >39.00)
NonHDL: 267.65
TRIGLYCERIDES: 258 mg/dL — AB (ref 0.0–149.0)
VLDL: 51.6 mg/dL — ABNORMAL HIGH (ref 0.0–40.0)

## 2014-12-01 LAB — BASIC METABOLIC PANEL
BUN: 18 mg/dL (ref 6–23)
CALCIUM: 9.5 mg/dL (ref 8.4–10.5)
CHLORIDE: 100 meq/L (ref 96–112)
CO2: 28 mEq/L (ref 19–32)
CREATININE: 1.16 mg/dL (ref 0.40–1.20)
GFR: 48.06 mL/min — ABNORMAL LOW (ref >60.00)
Glucose, Bld: 82 mg/dL (ref 70–99)
Potassium: 4.4 mEq/L (ref 3.5–5.1)
Sodium: 136 mEq/L (ref 135–145)

## 2014-12-01 LAB — HEMOGLOBIN A1C: HEMOGLOBIN A1C: 6.7 % — AB (ref 4.6–6.5)

## 2014-12-01 LAB — TSH: TSH: 9.05 u[IU]/mL — ABNORMAL HIGH (ref 0.35–4.50)

## 2014-12-01 LAB — LDL CHOLESTEROL, DIRECT: LDL DIRECT: 227 mg/dL

## 2014-12-02 ENCOUNTER — Telehealth: Payer: Self-pay | Admitting: Family

## 2014-12-02 NOTE — Telephone Encounter (Signed)
Please contact pt and let her know that her cholesterol is extremely high. I would recommend that she arrange follow up with lipid clinic. Sugar appears well controlled.

## 2014-12-03 ENCOUNTER — Telehealth: Payer: Self-pay

## 2014-12-03 ENCOUNTER — Other Ambulatory Visit: Payer: Self-pay

## 2014-12-03 LAB — URINE CULTURE: Colony Count: 50000

## 2014-12-03 MED ORDER — CIPROFLOXACIN HCL 250 MG PO TABS: 250.0000 mg | ORAL_TABLET | Freq: Two times a day (BID) | ORAL | Status: DC

## 2014-12-03 NOTE — Telephone Encounter (Signed)
Informed patient medication has been sent to pharmacy for her symptoms.

## 2014-12-03 NOTE — Telephone Encounter (Signed)
Please send Rex for Cipro 250 mg bid x 3 days. Thanks

## 2014-12-03 NOTE — Telephone Encounter (Signed)
Called patient with lab results. Expressed understanding. States she needs to know what she can do about her burning with urination. States it is really bad. Please advise.

## 2014-12-07 NOTE — Telephone Encounter (Signed)
Lab Results  Component Value Date   HGBA1C 6.7* 11/30/2014   No further recommendations, continue current meds and dietary changes.

## 2014-12-07 NOTE — Telephone Encounter (Signed)
Left message for pt to return my call.

## 2014-12-07 NOTE — Telephone Encounter (Signed)
Spoke with pt. States lipid clinic contacted her today. She is aware of cholesterol results. Wanted to know if she should be taking blood sugar medication. I advised pt that labs appear controlled on her diet alone. Pt states she was last told not to take blood sugar medication if BS remained below 150.  Please advise if any new instructions?

## 2014-12-07 NOTE — Telephone Encounter (Signed)
Notified pt and she voices understanding. 

## 2014-12-13 NOTE — Progress Notes (Signed)
Cardiology Office Note Date:  12/14/2014   ID:  Amanda Curtis, DOB Aug 22, 1935, MRN [id number]  PCP:  Nance Pear., NP  Cardiologist:  Sherren Mocha, MD    Chief Complaint  Patient presents with  . Chest Pain    History of Present Illness: Amanda Curtis is a 79 y.o. female who presents for follow-up of CAD. She had CABG in May 2015. Other problems include hypertension, hyperlipidemia, and bilateral carotid stenosis. A Myoview scan was performed after her last visit in February to evaluate recurrence of chest pain. This study was abnormal without a clear impression of significant ischemia. After review of symptoms with the patient, ongoing medical therapy was recommended.    Past Medical History  Diagnosis Date  . Hypertension   . Arthritis   . Graves disease 2000    s/p RIA  . Diabetes mellitus without complication ([place])   . Insomnia     Past Surgical History  Procedure Laterality Date  . Appendectomy    . Nasal septum surgery      childhood  . Intraoperative transesophageal echocardiogram N/A 07/04/2013    Procedure: INTRAOPERATIVE TRANSESOPHAGEAL ECHOCARDIOGRAM;  Surgeon: Gaye Pollack, MD;  Location: [facility] OR;  Service: Open Heart Surgery;  Laterality: N/A;  . Coronary artery bypass graft N/A 07/04/2013    Procedure: CORONARY ARTERY BYPASS GRAFTING (CABG) times four using left internal mammary artery and right saphenous leg vein.;  Surgeon: Gaye Pollack, MD;  Location: [facility] OR;  Service: Open Heart Surgery;  Laterality: N/A;  . Left heart catheterization with coronary angiogram N/A 06/30/2013    Procedure: LEFT HEART CATHETERIZATION WITH CORONARY ANGIOGRAM;  Surgeon: Blane Ohara, MD;  Location: [facility] CATH LAB;  Service: Cardiovascular;  Laterality: N/A;    Current Outpatient Prescriptions  Medication Sig Dispense Refill  . acetaminophen (TYLENOL) 325 MG tablet Take 2 tablets (650 mg total) by mouth every 6 (six) hours as needed for mild pain.    [name] ALPRAZolam  (XANAX) 0.25 MG tablet take 1 tablet by mouth twice a day if needed for anxiety or sleep 45 tablet 0  . amLODipine (NORVASC) 10 MG tablet Take 1 tablet (10 mg total) by mouth daily. 30 tablet 8  . aspirin EC 81 MG tablet Take 81 mg by mouth daily.    [name] EPINEPHrine 0.3 mg/0.3 mL IJ SOAJ injection Inject 0.3 mLs (0.3 mg total) into the muscle once. As needed for tongue/lip swelling/shortness of breath with bee/wasp sting 2 Device 1  . Fluticasone Propionate (FLONASE NA) Place 1 spray into the nose daily as needed (for allergies).     . losartan (COZAAR) 100 MG tablet take 1 tablet by mouth once daily 30 tablet 3  . SYNTHROID 100 MCG tablet take 1 tablet by mouth every morning 30 tablet 5  . pravastatin (PRAVACHOL) 40 MG tablet Take 1 tablet (40 mg total) by mouth every evening. 30 tablet 8   No current facility-administered medications for this visit.    Allergies:   Ampicillin; Atorvastatin; Elavil; Hctz; Hytrin; Wasp venom; and Erythromycin   Social History:  The patient  reports that she has never smoked. She has never used smokeless tobacco. She reports that she drinks alcohol. She reports that she does not use illicit drugs.   Family History:  The patient's  family history includes Cancer in her mother; Dementia in her mother; Diabetes in her father and paternal grandmother; Hypertension in her paternal grandmother.    ROS:  Please see the history  of present illness.  Otherwise, review of systems is positive for dizziness, fatigue, leg pain, generalized weakness.  All other systems are reviewed and negative.    PHYSICAL EXAM: VS:  BP 170/80 mmHg  Pulse 61  Ht 5' 5.5" (1.664 m)  Wt 156 lb 1.9 oz (70.816 kg)  BMI 25.58 kg/m2 , BMI Body mass index is 25.58 kg/(m^2). GEN: Well nourished, well developed, in no acute distress HEENT: normal Neck: no JVD, no masses. No carotid bruits Cardiac: RRR without murmur or gallop                Respiratory:  clear to auscultation bilaterally,  normal work of breathing GI: soft, nontender, nondistended, + BS MS: no deformity or atrophy Ext: no pretibial edema, pedal pulses 2+= bilaterally Skin: warm and dry, no rash Neuro:  Strength and sensation are intact Psych: euthymic mood, full affect  EKG:  EKG is not ordered today.  Recent Labs: 11/30/2014: BUN 18; Creatinine, Ser 1.16; Potassium 4.4; Sodium 136; TSH 9.05*   Lipid Panel     Component Value Date/Time   CHOL 317* 11/30/2014 1534   TRIG 258.0* 11/30/2014 1534   HDL 49.70 11/30/2014 1534   CHOLHDL 6 11/30/2014 1534   VLDL 51.6* 11/30/2014 1534   LDLCALC 206* 10/22/2013 1423   LDLDIRECT 227.0 11/30/2014 1534      Wt Readings from Last 3 Encounters:  12/14/14 156 lb 1.9 oz (70.816 kg)  11/30/14 156 lb 9.6 oz (71.033 kg)  10/01/14 150 lb 2 oz (68.096 kg)    Cardiac Studies Reviewed: Myoview Scan 04/07/2014: Overall Impression: This study is abnormal. It is a moderate risk study because of the question of the number of segments involved. The findings are unusual because the quantitative analysis suggests abnormalities that are not seen well on the tomographic images. These abnormalities occur in the same areas that showed definite ischemia on the study of February, 2015. The abnormalities were clearly seen on the tomographic images in February, 2015. Overall, consideration has to be given to the possibility of mild ischemia affecting the anterolateral wall in the inferolateral wall.  LV Ejection Fraction: 71%. LV Wall Motion: Normal Wall Motion   Rib XRay 07/15/2014: IMPRESSION: 1. No displaced left rib fracture identified. 2. No acute cardiopulmonary abnormality identified  ASSESSMENT AND PLAN: 1.  CAD, native vessel, with atypical angina. Medical program reviewed. Diet and exercise recommendations made. Last Myoview scan reviewed as above. Continue same Rx and see back in 6 months.  2. Sternal chest pain: continues to be a prominent complaint. Rib series did  not show deformity. Will ask Dr Cyndia Bent to see her for follow-up evaluation. Advised this is likely something she's going to have to live with.  3. HTN, uncontrolled: increase amlodipine to 10 mg daily, follow low-sodium diet, continue remaining antihypertensive agents.  4. Hyperlipidemia: statin dose has been limited by side effects. Discussed options with her at length. She doesn't want to consider PCSK9 right now because of injections. I advised her that she is capable of doing this. Will resume pravastatin and repeat lipids in 3 months with a Lipid Clinic follow-up. Advised that pravastatin alone will NOT get her to goal, but cost and tolerance are major issues.   Current medicines are reviewed with the patient today.  The patient does not have concerns regarding medicines.  Labs/ tests ordered today include:   Orders Placed This Encounter  Procedures  . Lipid panel  . Hepatic function panel    Disposition:  FU 6 months  Signed, Sherren Mocha, MD  12/14/2014 1:03 PM    [facility] [place], [place], [place]  [address] Phone: [phone]; Fax: [phone]

## 2014-12-14 ENCOUNTER — Ambulatory Visit (INDEPENDENT_AMBULATORY_CARE_PROVIDER_SITE_OTHER): Payer: Medicare Other | Admitting: Cardiovascular Disease

## 2014-12-14 ENCOUNTER — Encounter: Payer: Self-pay | Admitting: Cardiovascular Disease

## 2014-12-14 VITALS — BP 170/80 | HR 61 | Ht 65.5 in | Wt 156.1 lb

## 2014-12-14 DIAGNOSIS — I10 Essential (primary) hypertension: Secondary | ICD-10-CM | POA: Diagnosis not present

## 2014-12-14 DIAGNOSIS — E785 Hyperlipidemia, unspecified: Secondary | ICD-10-CM

## 2014-12-14 MED ORDER — PRAVASTATIN SODIUM 40 MG PO TABS: 40.0000 mg | ORAL_TABLET | Freq: Every evening | ORAL | Status: DC

## 2014-12-14 MED ORDER — AMLODIPINE BESYLATE 10 MG PO TABS: 10.0000 mg | ORAL_TABLET | Freq: Every day | ORAL | Status: DC

## 2014-12-14 NOTE — Patient Instructions (Signed)
Medication Instructions:  Your physician has recommended you make the following change in your medication:  1. INCREASE Amlodipine to 10mg  take one by mouth daily 2. RESTART Pravastatin 40mg  take one by mouth daily  Labwork: Your physician recommends that you return for a FASTING LIPID and LIVER in 3 MONTHS--nothing to eat or drink after midnight, lab opens at 7:30 AM  Testing/Procedures: No new orders.  Follow-Up: You have been referred to the Lipid Clinic in 3 MONTHS (appointment a week after fasting lab work)  Your physician recommends that you schedule a follow-up appointment with Dr Cyndia Bent to discuss sternal pain after CABG.  Your physician wants you to follow-up in: 6 MONTHS with Dr Burt Knack.  You will receive a reminder letter in the mail two months in advance. If you don't receive a letter, please call our office to schedule the follow-up appointment.  Any Other Special Instructions Will Be Listed Below (If Applicable).

## 2014-12-21 ENCOUNTER — Ambulatory Visit: Payer: Medicare Other | Admitting: Pharmacist

## 2014-12-30 ENCOUNTER — Ambulatory Visit (INDEPENDENT_AMBULATORY_CARE_PROVIDER_SITE_OTHER): Payer: Medicare Other | Admitting: Family

## 2014-12-30 ENCOUNTER — Ambulatory Visit: Payer: Medicare Other | Admitting: Family

## 2014-12-30 ENCOUNTER — Encounter: Payer: Self-pay | Admitting: Family

## 2014-12-30 VITALS — BP 147/80 | HR 66 | Temp 98.3°F | Resp 16 | Ht 66.0 in | Wt 158.0 lb

## 2014-12-30 DIAGNOSIS — R319 Hematuria, unspecified: Secondary | ICD-10-CM

## 2014-12-30 DIAGNOSIS — N3001 Acute cystitis with hematuria: Secondary | ICD-10-CM | POA: Diagnosis not present

## 2014-12-30 DIAGNOSIS — E039 Hypothyroidism, unspecified: Secondary | ICD-10-CM

## 2014-12-30 DIAGNOSIS — R300 Dysuria: Secondary | ICD-10-CM | POA: Diagnosis not present

## 2014-12-30 LAB — POCT URINALYSIS DIPSTICK
BILIRUBIN UA: NEGATIVE
Glucose, UA: NEGATIVE
Ketones, UA: NEGATIVE
Nitrite, UA: NEGATIVE
Protein, UA: NEGATIVE
Spec Grav, UA: 1.025
UROBILINOGEN UA: NEGATIVE
pH, UA: 6

## 2014-12-30 NOTE — Patient Instructions (Addendum)
Please complete lab work prior to leaving. Start bactrim for urinary tract infection. You will be contacted about your referral to urology to evaluate your burning and recurrent urinary tract infections. Schedule mammogram on the first floor.  Call if your symptoms worsen or do not improve.

## 2014-12-30 NOTE — Addendum Note (Signed)
Addended by: Caffie Pinto on: 12/30/2014 04:01 PM   Modules accepted: Orders

## 2014-12-30 NOTE — Progress Notes (Signed)
Subjective:    Patient ID: Amanda Curtis, female    DOB: 10/24/1935, 79 y.o.   MRN: [id number]  HPI  Amanda Curtis is a 79 yr old female who presents today with chief complaint of dysuria.  Reports that she has had dysuria frequently.  + frequency.  Reports intermittent right sided flank pain.  Dysuria is worse at night.  Reports that she did not feel good on cipro.  Hypothyroid- reports ongoing fatigue. Review of Systems See HPI  Past Medical History  Diagnosis Date  . Hypertension   . Arthritis   . Graves disease 2000    s/p RIA  . Diabetes mellitus without complication ([place])   . Insomnia     Social History   Social History  . Marital Status: Widowed    Spouse Name: N/A  . Number of Children: N/A  . Years of Education: N/A   Occupational History  . Not on file.   Social History Main Topics  . Smoking status: Never Smoker   . Smokeless tobacco: Never Used  . Alcohol Use: Yes     Comment: 1 glass wine every 6 months  . Drug Use: No  . Sexual Activity: Not on file   Other Topics Concern  . Not on file   Social History Narrative   Widow   Retired worked for [employer] and did a lot of travelling for [employer].   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   She has 1 son- lives in [place]   3 grandsons- college age (one is in [place])             Past Surgical History  Procedure Laterality Date  . Appendectomy    . Nasal septum surgery      childhood  . Intraoperative transesophageal echocardiogram N/A 07/04/2013    Procedure: INTRAOPERATIVE TRANSESOPHAGEAL ECHOCARDIOGRAM;  Surgeon: Gaye Pollack, MD;  Location: [facility] OR;  Service: Open Heart Surgery;  Laterality: N/A;  . Coronary artery bypass graft N/A 07/04/2013    Procedure: CORONARY ARTERY BYPASS GRAFTING (CABG) times four using left internal mammary artery and right saphenous leg vein.;  Surgeon: Gaye Pollack, MD;  Location:  [facility] OR;  Service: Open Heart Surgery;  Laterality: N/A;  . Left heart catheterization with coronary angiogram N/A 06/30/2013    Procedure: LEFT HEART CATHETERIZATION WITH CORONARY ANGIOGRAM;  Surgeon: Blane Ohara, MD;  Location: [facility] CATH LAB;  Service: Cardiovascular;  Laterality: N/A;    Family History  Problem Relation Age of Onset  . Dementia Mother   . Cancer Mother     breast  . Diabetes Father   . Diabetes Paternal Grandmother   . Hypertension Paternal Grandmother     Allergies  Allergen Reactions  . Ampicillin     rash  . Atorvastatin Other (See Comments)    Severe muscle cramping  . Elavil [Amitriptyline] Other (See Comments)    Tongue swelling  . Hctz [Hydrochlorothiazide] Other (See Comments)    Severe leg cramps  . Hytrin [Terazosin] Other (See Comments)    syncope  . Wasp Venom Other (See Comments)    Hand swelling  . Erythromycin Rash    Current Outpatient Prescriptions on File Prior to Visit  Medication Sig Dispense Refill  . acetaminophen (TYLENOL) 325 MG tablet Take 2 tablets (650 mg total) by mouth every 6 (six) hours as needed for mild pain.    [name]  ALPRAZolam (XANAX) 0.25 MG tablet take 1 tablet by mouth twice a day if needed for anxiety or sleep 45 tablet 0  . amLODipine (NORVASC) 10 MG tablet Take 1 tablet (10 mg total) by mouth daily. 30 tablet 8  . aspirin EC 81 MG tablet Take 81 mg by mouth daily.    [name] EPINEPHrine 0.3 mg/0.3 mL IJ SOAJ injection Inject 0.3 mLs (0.3 mg total) into the muscle once. As needed for tongue/lip swelling/shortness of breath with bee/wasp sting 2 Device 1  . Fluticasone Propionate (FLONASE NA) Place 1 spray into the nose daily as needed (for allergies).     . losartan (COZAAR) 100 MG tablet take 1 tablet by mouth once daily 30 tablet 3  . pravastatin (PRAVACHOL) 40 MG tablet Take 1 tablet (40 mg total) by mouth every evening. 30 tablet 8  . SYNTHROID 100 MCG tablet take 1 tablet by mouth every morning 30 tablet 5   No current  facility-administered medications on file prior to visit.    BP 147/80 mmHg  Pulse 66  Temp(Src) 98.3 F (36.8 C) (Oral)  Resp 16  Ht 5\' 6"  (1.676 m)  Wt 158 lb (71.668 kg)  BMI 25.51 kg/m2  SpO2 98%       Objective:   Physical Exam  Constitutional: She appears well-developed and well-nourished.  HENT:  Head: Normocephalic and atraumatic.  Cardiovascular: Normal rate, regular rhythm and normal heart sounds.   No murmur heard. Pulmonary/Chest: Effort normal and breath sounds normal. No respiratory distress. She has no wheezes.  Abdominal: Soft. She exhibits no distension. There is no tenderness. There is no rebound.  Genitourinary:  Neg CVAT bilaterally  Psychiatric: She has a normal mood and affect. Her behavior is normal. Judgment and thought content normal.          Assessment & Plan:  Acute cystitis without hematuria- rx with bactrim (declines cipro and has allergy to ampicillin).  Send urine for culture and UDS.  Refer to urology due to recurrent UTI and c/o chronic dysuria.   Hypothyroid-  Lab Results  Component Value Date   TSH 9.05* 11/30/2014   Obtain follow up [facility]

## 2014-12-30 NOTE — Progress Notes (Signed)
Pre visit review using our clinic review tool, if applicable. No additional management support is needed unless otherwise documented below in the visit note. 

## 2014-12-31 LAB — TSH: TSH: 1.01 u[IU]/mL (ref 0.35–4.50)

## 2015-01-02 LAB — URINE CULTURE: Colony Count: 65000

## 2015-01-03 ENCOUNTER — Telehealth: Payer: Self-pay | Admitting: Family

## 2015-01-03 DIAGNOSIS — N390 Urinary tract infection, site not specified: Secondary | ICD-10-CM

## 2015-01-03 MED ORDER — CEPHALEXIN 500 MG PO CAPS: 500.0000 mg | ORAL_CAPSULE | Freq: Two times a day (BID) | ORAL | Status: DC

## 2015-01-03 NOTE — Telephone Encounter (Signed)
Urine culture is growing bacteria.  Need to stop bactrim since this will not treat the bacteria which is growing.  Start Keflex

## 2015-01-04 ENCOUNTER — Other Ambulatory Visit: Payer: Self-pay | Admitting: Surgery

## 2015-01-04 DIAGNOSIS — Z951 Presence of aortocoronary bypass graft: Secondary | ICD-10-CM

## 2015-01-04 NOTE — Telephone Encounter (Signed)
Notified pt and she voices understanding. She will keep urology appointment in December.

## 2015-01-04 NOTE — Telephone Encounter (Signed)
This is more common after menopause.  I would like her to meet with urology for further evaluation.  Referral was placed at time of her visit.

## 2015-01-04 NOTE — Telephone Encounter (Signed)
Notified pt and she voices understanding. States pharmacy never got Bactrim Rx. She will start Keflex. Pt wants to know why she is getting frequent UTI (3 since August).  Discussed hygiene and pt reports compliance.  Please advise?

## 2015-01-05 ENCOUNTER — Other Ambulatory Visit: Payer: Self-pay

## 2015-01-05 DIAGNOSIS — R079 Chest pain, unspecified: Secondary | ICD-10-CM

## 2015-01-06 ENCOUNTER — Ambulatory Visit: Payer: Medicare Other | Admitting: Surgery

## 2015-01-06 ENCOUNTER — Telehealth: Payer: Self-pay | Admitting: *Deleted

## 2015-01-13 ENCOUNTER — Ambulatory Visit
Admission: RE | Admit: 2015-01-13 | Discharge: 2015-01-13 | Disposition: A | Discharge status: Home / Self Care | Payer: Medicare Other | Source: Ambulatory Visit | Attending: Surgery | Admitting: Surgery

## 2015-01-13 ENCOUNTER — Encounter: Payer: Self-pay | Admitting: Surgery

## 2015-01-13 ENCOUNTER — Ambulatory Visit (INDEPENDENT_AMBULATORY_CARE_PROVIDER_SITE_OTHER): Payer: Medicare Other | Admitting: Surgery

## 2015-01-13 VITALS — BP 136/85 | HR 86 | Resp 16 | Ht 66.0 in | Wt 158.0 lb

## 2015-01-13 DIAGNOSIS — I2510 Atherosclerotic heart disease of native coronary artery without angina pectoris: Secondary | ICD-10-CM

## 2015-01-13 DIAGNOSIS — R079 Chest pain, unspecified: Secondary | ICD-10-CM

## 2015-01-13 DIAGNOSIS — Z951 Presence of aortocoronary bypass graft: Secondary | ICD-10-CM | POA: Diagnosis not present

## 2015-01-14 ENCOUNTER — Encounter: Payer: Self-pay | Admitting: Surgery

## 2015-01-14 NOTE — Progress Notes (Signed)
Cardiothoracic Surgery Consultation   PCP is Nance Pear., NP Referring Provider is Nahser, Wonda Cheng, MD  Chief Complaint  Patient presents with  . Routine Post Op    sternal pain intermittent since surgery... s/p CABG X 4 07/04/13....with a cxr    HPI:  The patient is a 79 year old woman who underwent CABG x 4 on 07/04/13. She had an uncomplicated course but has had some vague anterior chest pain since that has persisted. She had a Myoview scan in 03/2014 that was low risk. She continues to complain of pain beneath the sternotomy incision as well as in the left anterior chest wall in a segmental distribution. She also reports persistent numbness in the left anterior chest. She feels like these symptoms are limiting her activity but she has not been taking anything for the pain.   Past Medical History  Diagnosis Date  . Hypertension   . Arthritis   . Graves disease 2000    s/p RIA  . Diabetes mellitus without complication ([place])   . Insomnia     Past Surgical History  Procedure Laterality Date  . Appendectomy    . Nasal septum surgery      childhood  . Intraoperative transesophageal echocardiogram N/A 07/04/2013    Procedure: INTRAOPERATIVE TRANSESOPHAGEAL ECHOCARDIOGRAM;  Surgeon: Gaye Pollack, MD;  Location: [facility] OR;  Service: Open Heart Surgery;  Laterality: N/A;  . Coronary artery bypass graft N/A 07/04/2013    Procedure: CORONARY ARTERY BYPASS GRAFTING (CABG) times four using left internal mammary artery and right saphenous leg vein.;  Surgeon: Gaye Pollack, MD;  Location: [facility] OR;  Service: Open Heart Surgery;  Laterality: N/A;  . Left heart catheterization with coronary angiogram N/A 06/30/2013    Procedure: LEFT HEART CATHETERIZATION WITH CORONARY ANGIOGRAM;  Surgeon: Blane Ohara, MD;  Location: [facility] CATH LAB;  Service: Cardiovascular;  Laterality: N/A;    Family History  Problem Relation Age of Onset  . Dementia Mother   . Cancer Mother     breast  .  Diabetes Father   . Diabetes Paternal Grandmother   . Hypertension Paternal Grandmother     Social History Social History  Substance Use Topics  . Smoking status: Never Smoker   . Smokeless tobacco: Never Used  . Alcohol Use: Yes     Comment: 1 glass wine every 6 months    Current Outpatient Prescriptions  Medication Sig Dispense Refill  . acetaminophen (TYLENOL) 325 MG tablet Take 2 tablets (650 mg total) by mouth every 6 (six) hours as needed for mild pain.    [name] ALPRAZolam (XANAX) 0.25 MG tablet take 1 tablet by mouth twice a day if needed for anxiety or sleep 45 tablet 0  . amLODipine (NORVASC) 10 MG tablet Take 1 tablet (10 mg total) by mouth daily. 30 tablet 8  . aspirin EC 81 MG tablet Take 81 mg by mouth daily.    [name] EPINEPHrine 0.3 mg/0.3 mL IJ SOAJ injection Inject 0.3 mLs (0.3 mg total) into the muscle once. As needed for tongue/lip swelling/shortness of breath with bee/wasp sting 2 Device 1  . Fluticasone Propionate (FLONASE NA) Place 1 spray into the nose daily as needed (for allergies).     . losartan (COZAAR) 100 MG tablet take 1 tablet by mouth once daily 30 tablet 3  . pravastatin (PRAVACHOL) 40 MG tablet Take 1 tablet (40 mg total) by mouth every evening. 30 tablet 8  . SYNTHROID 100 MCG tablet take  1 tablet by mouth every morning 30 tablet 5   No current facility-administered medications for this visit.    Allergies  Allergen Reactions  . Ampicillin     rash  . Atorvastatin Other (See Comments)    Severe muscle cramping  . Elavil [Amitriptyline] Other (See Comments)    Tongue swelling  . Hctz [Hydrochlorothiazide] Other (See Comments)    Severe leg cramps  . Hytrin [Terazosin] Other (See Comments)    syncope  . Wasp Venom Other (See Comments)    Hand swelling  . Erythromycin Rash    Review of Systems  Constitutional: Positive for activity change. Negative for fever, appetite change, fatigue and unexpected weight change.  HENT: Negative.   Respiratory:  Negative for chest tightness and shortness of breath.   Cardiovascular: Negative for chest pain, palpitations and leg swelling.  Gastrointestinal: Negative.   Endocrine: Negative.   Genitourinary: Negative.   Musculoskeletal:       Anterior chest wall discomfort  Skin: Negative.   Neurological: Negative.   Hematological: Negative.   Psychiatric/Behavioral: The patient is nervous/anxious.     BP 136/85 mmHg  Pulse 86  Resp 16  Ht 5\' 6"  (1.676 m)  Wt 158 lb (71.668 kg)  BMI 25.51 kg/m2  SpO2 98% Physical Exam  Constitutional: She appears well-developed and well-nourished. No distress.  Cardiovascular: Normal rate, regular rhythm and normal heart sounds.   No murmur heard. Pulmonary/Chest: Effort normal and breath sounds normal. No respiratory distress.  Mild tenderness along the lower sternum. The incision is well-healed and there is no sign of infection. The sternum is stable.     Diagnostic Tests:  CLINICAL DATA: Chest pain  EXAM: CHEST 2 VIEW  COMPARISON: 08/06/2013  FINDINGS: The lungs are hyperinflated likely secondary to COPD. There is mild left basilar scarring. There is no focal parenchymal opacity. There is no pleural effusion or pneumothorax. The heart and mediastinal contours are unremarkable. There is evidence of prior CABG.  The osseous structures are unremarkable.  IMPRESSION: No active cardiopulmonary disease.   Electronically Signed  By: Kathreen Devoid  On: 01/13/2015 11:20   Impression:  She has persistent chest wall discomfort over 1 year following sternotomy for CABG. The distribution sounds like it could be related to the sternal wires which we see occasionally. It does not sound like angina. I think the only therapeutic option is wire removal which can be done easily through a couple of very small incisions as an outpatient. I discussed this with her and told her that there was no guarantee that removing the wires would resolve  all of her symptoms. I have done this in one or two patients a year with persistent pain and it usually completely resolves.  Plan:  She is going to think about sternal wire removal and will call me to schedule it if she decides to proceed.  Gaye Pollack, MD [facility] [phone]

## 2015-01-19 ENCOUNTER — Other Ambulatory Visit: Payer: Self-pay | Admitting: Family

## 2015-01-19 ENCOUNTER — Telehealth: Payer: Self-pay | Admitting: Family

## 2015-01-19 NOTE — Telephone Encounter (Signed)
Pt returned my call and scheduled f/u for tomorrow at 1:15pm.

## 2015-01-19 NOTE — Telephone Encounter (Signed)
Pt calling for thyroid results. I told her they were normal and she is asking further questions. She is asking about b12 and other things to help her with feeling tired. She knows it may be in part to kidney issues. Pt states she is still have UTI sx as well. She is requesting call back.

## 2015-01-19 NOTE — Telephone Encounter (Signed)
Pt states she completed Keflex but symptoms never resolved. Continues to have constant "vaginal burning and burning in right side pubic area". Symptoms seem to be worse at night and has to urinate 4 to 5 times during the night x 1 month.  Feels fatigued. Has not seen urology yet but is supposed to have consult next month.  Please advise.

## 2015-01-19 NOTE — Telephone Encounter (Signed)
Left message for pt to return my call. Will need a 30 min appt.

## 2015-01-19 NOTE — Telephone Encounter (Signed)
I would recommend OV for repeat urine culture and pelvic exam.

## 2015-01-20 ENCOUNTER — Encounter: Payer: Self-pay | Admitting: Family

## 2015-01-20 ENCOUNTER — Other Ambulatory Visit (HOSPITAL_COMMUNITY)
Admission: RE | Admit: 2015-01-20 | Discharge: 2015-01-20 | Disposition: A | Discharge status: Home / Self Care | Payer: Medicare Other | Source: Ambulatory Visit | Attending: Family | Admitting: Family

## 2015-01-20 ENCOUNTER — Ambulatory Visit (INDEPENDENT_AMBULATORY_CARE_PROVIDER_SITE_OTHER): Payer: Medicare Other | Admitting: Family

## 2015-01-20 VITALS — BP 127/69 | HR 62 | Temp 98.5°F | Resp 16 | Ht 66.0 in

## 2015-01-20 DIAGNOSIS — N760 Acute vaginitis: Secondary | ICD-10-CM | POA: Insufficient documentation

## 2015-01-20 DIAGNOSIS — R300 Dysuria: Secondary | ICD-10-CM

## 2015-01-20 NOTE — Progress Notes (Signed)
Pre visit review using our clinic review tool, if applicable. No additional management support is needed unless otherwise documented below in the visit note. 

## 2015-01-20 NOTE — Patient Instructions (Signed)
Please complete lab work prior to leaving. We will contact you with the results of your tests.

## 2015-01-20 NOTE — Addendum Note (Signed)
Addended by: Kelle Darting A on: 01/20/2015 02:32 PM   Modules accepted: Orders

## 2015-01-20 NOTE — Progress Notes (Signed)
Subjective:    Patient ID: Amanda Curtis, female    DOB: 03-Jul-1935, 79 y.o.   MRN: [id number]  HPI  Amanda Curtis is a 79 yr old female who presents today with chief complaint of vaginal burning. Reports increased urination at night. Pt urinates 4-5 times each night.  Reports periurethral burning.  Worse on the right.  Burns before and after urination. Denies vaginal itching.    Review of Systems See HPI  Past Medical History  Diagnosis Date  . Hypertension   . Arthritis   . Graves disease 2000    s/p RIA  . Diabetes mellitus without complication ([place])   . Insomnia     Social History   Social History  . Marital Status: Widowed    Spouse Name: Amanda Curtis  . Number of Children: Amanda Curtis  . Years of Education: Amanda Curtis   Occupational History  . Not on file.   Social History Main Topics  . Smoking status: Never Smoker   . Smokeless tobacco: Never Used  . Alcohol Use: Yes     Comment: 1 glass wine every 6 months  . Drug Use: No  . Sexual Activity: Not on file   Other Topics Concern  . Not on file   Social History Narrative   Widow   Retired worked for [employer] and did a lot of travelling for [employer].   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   She has 1 son- lives in [place]   3 grandsons- college age (one is in [place])             Past Surgical History  Procedure Laterality Date  . Appendectomy    . Nasal septum surgery      childhood  . Intraoperative transesophageal echocardiogram Amanda Curtis 07/04/2013    Procedure: INTRAOPERATIVE TRANSESOPHAGEAL ECHOCARDIOGRAM;  Surgeon: Gaye Pollack, MD;  Location: [facility] OR;  Service: Open Heart Surgery;  Laterality: Amanda Curtis;  . Coronary artery bypass graft Amanda Curtis 07/04/2013    Procedure: CORONARY ARTERY BYPASS GRAFTING (CABG) times four using left internal mammary artery and right saphenous leg vein.;  Surgeon: Gaye Pollack, MD;  Location: [facility] OR;  Service: Open Heart  Surgery;  Laterality: Amanda Curtis;  . Left heart catheterization with coronary angiogram Amanda Curtis 06/30/2013    Procedure: LEFT HEART CATHETERIZATION WITH CORONARY ANGIOGRAM;  Surgeon: Blane Ohara, MD;  Location: [facility] CATH LAB;  Service: Cardiovascular;  Laterality: Amanda Curtis;    Family History  Problem Relation Age of Onset  . Dementia Mother   . Cancer Mother     breast  . Diabetes Father   . Diabetes Paternal Grandmother   . Hypertension Paternal Grandmother     Allergies  Allergen Reactions  . Ampicillin     rash  . Atorvastatin Other (See Comments)    Severe muscle cramping  . Elavil [Amitriptyline] Other (See Comments)    Tongue swelling  . Hctz [Hydrochlorothiazide] Other (See Comments)    Severe leg cramps  . Hytrin [Terazosin] Other (See Comments)    syncope  . Wasp Venom Other (See Comments)    Hand swelling  . Erythromycin Rash    Current Outpatient Prescriptions on File Prior to Visit  Medication Sig Dispense Refill  . acetaminophen (TYLENOL) 325 MG tablet Take 2 tablets (650 mg total) by mouth every 6 (six) hours as needed for mild pain.    [name] ALPRAZolam (XANAX) 0.25 MG  tablet take 1 tablet by mouth twice a day if needed for anxiety or sleep 45 tablet 0  . amLODipine (NORVASC) 10 MG tablet Take 1 tablet (10 mg total) by mouth daily. 30 tablet 8  . aspirin EC 81 MG tablet Take 81 mg by mouth daily.    [name] EPINEPHrine 0.3 mg/0.3 mL IJ SOAJ injection Inject 0.3 mLs (0.3 mg total) into the muscle once. As needed for tongue/lip swelling/shortness of breath with bee/wasp sting 2 Device 1  . Fluticasone Propionate (FLONASE NA) Place 1 spray into the nose daily as needed (for allergies).     . losartan (COZAAR) 100 MG tablet take 1 tablet by mouth once daily 30 tablet 5  . pravastatin (PRAVACHOL) 40 MG tablet Take 1 tablet (40 mg total) by mouth every evening. 30 tablet 8  . SYNTHROID 100 MCG tablet take 1 tablet by mouth every morning 30 tablet 5   No current facility-administered  medications on file prior to visit.    BP 127/69 mmHg  Pulse 62  Temp(Src) 98.5 F (36.9 C) (Oral)  Resp 16  Ht 5\' 6"  (1.676 m)  Wt   SpO2 100%       Objective:   Physical Exam  Constitutional: She appears well-developed and well-nourished.  Cardiovascular: Normal rate, regular rhythm and normal heart sounds.   No murmur heard. Pulmonary/Chest: Effort normal and breath sounds normal. No respiratory distress. She has no wheezes.  Genitourinary: Vagina normal. There is no rash or lesion on the right labia. There is no rash or lesion on the left labia. No vaginal discharge found.  Psychiatric: She has a normal mood and affect. Her behavior is normal. Judgment and thought content normal.          Assessment & Plan:  Vaginitis- Wet prep obtained to evaluate for yeast/bacteria.   Atrophic vaginitis is another consideration. Will also sent urine for culture to rule out infection.

## 2015-01-22 ENCOUNTER — Telehealth: Payer: Self-pay | Admitting: Family

## 2015-01-22 DIAGNOSIS — N952 Postmenopausal atrophic vaginitis: Secondary | ICD-10-CM

## 2015-01-22 LAB — URINE CULTURE
Colony Count: NO GROWTH
ORGANISM ID, BACTERIA: NO GROWTH

## 2015-01-22 LAB — CERVICOVAGINAL ANCILLARY ONLY: Wet Prep (BD Affirm): NEGATIVE

## 2015-01-22 NOTE — Telephone Encounter (Signed)
Please let pt know that her urine culture was negative.  No yeast/bacteria on wet prep.  I think her discomfort is due to vaginal atrophy from menopause. I would recommend referral to GYN if she is willing.

## 2015-01-26 NOTE — Telephone Encounter (Signed)
Notified pt and she is agreeable to proceed with referral. She wants to see a female doctor.  Referral updated.

## 2015-02-03 ENCOUNTER — Encounter: Payer: Self-pay | Admitting: Obstetrics & Gynecology

## 2015-02-03 ENCOUNTER — Ambulatory Visit (INDEPENDENT_AMBULATORY_CARE_PROVIDER_SITE_OTHER): Payer: Medicare Other | Admitting: Obstetrics & Gynecology

## 2015-02-03 VITALS — BP 126/80 | HR 69 | Ht 66.0 in | Wt 158.0 lb

## 2015-02-03 DIAGNOSIS — N905 Atrophy of vulva: Secondary | ICD-10-CM | POA: Diagnosis not present

## 2015-02-03 MED ORDER — ESTRADIOL 0.1 MG/GM VA CREA: TOPICAL_CREAM | VAGINAL | Status: DC

## 2015-02-03 NOTE — Progress Notes (Signed)
   Subjective:    Patient ID: Amanda Curtis, female    DOB: 05-Sep-1935, 79 y.o.   MRN: [id number]  HPI  79yo  WW lady here with vulvo vaginal burning, presumably from atrophy. She was on premarin cream years ago and did well with it but her gyn stopped it right after the [facility] studies came out.   Review of Systems  She has a mammogram ordered She had her flu vaccine.     Objective:   Physical Exam Lovely WFNAD Breathing, conversing, and ambulating normally Abd- benign EG, vagina- atrophic, moderate-severe Bimanual- deferred until next month when her vagina will be less atrophic       Assessment & Plan:  Symptomatic vulvovaginal atrophy- estrace cream 1 gram PV for 4 weeks then 2 times per week RTC 4 weeks

## 2015-02-03 NOTE — Progress Notes (Signed)
Patient states that she has vaginal burning most days all throughout the day. Patient states that she has used Premarin cream in the past but was taken off by a previous GYN. Kathrene Alu RN BSN

## 2015-02-04 ENCOUNTER — Telehealth: Payer: Self-pay

## 2015-02-04 NOTE — Telephone Encounter (Signed)
Left message to schedule annual wellness visit with RN

## 2015-02-16 ENCOUNTER — Encounter: Payer: Self-pay | Admitting: Family Medicine

## 2015-02-16 ENCOUNTER — Ambulatory Visit (INDEPENDENT_AMBULATORY_CARE_PROVIDER_SITE_OTHER): Payer: Medicare Other | Admitting: Family Medicine

## 2015-02-16 VITALS — BP 130/82 | HR 66 | Temp 98.2°F | Ht 66.0 in

## 2015-02-16 DIAGNOSIS — J069 Acute upper respiratory infection, unspecified: Secondary | ICD-10-CM

## 2015-02-16 DIAGNOSIS — R05 Cough: Secondary | ICD-10-CM

## 2015-02-16 DIAGNOSIS — R059 Cough, unspecified: Secondary | ICD-10-CM

## 2015-02-16 MED ORDER — GUAIFENESIN-CODEINE 100-10 MG/5ML PO SYRP: 5.0000 mL | ORAL_SOLUTION | Freq: Three times a day (TID) | ORAL | Status: DC | PRN

## 2015-02-16 MED ORDER — LEVOCETIRIZINE DIHYDROCHLORIDE 5 MG PO TABS: 5.0000 mg | ORAL_TABLET | Freq: Every evening | ORAL | Status: DC

## 2015-02-16 MED ORDER — FLUTICASONE PROPIONATE 50 MCG/ACT NA SUSP: 2.0000 | Freq: Every day | NASAL | Status: DC

## 2015-02-16 NOTE — Progress Notes (Signed)
Pre visit review using our clinic review tool, if applicable. No additional management support is needed unless otherwise documented below in the visit note. 

## 2015-02-16 NOTE — Patient Instructions (Signed)
Upper Respiratory Infection, Adult Most upper respiratory infections (URIs) are a viral infection of the air passages leading to the lungs. A URI affects the nose, throat, and upper air passages. The most common type of URI is nasopharyngitis and is typically referred to as "the common cold." URIs run their course and usually go away on their own. Most of the time, a URI does not require medical attention, but sometimes a bacterial infection in the upper airways can follow a viral infection. This is called a secondary infection. Sinus and middle ear infections are common types of secondary upper respiratory infections. Bacterial pneumonia can also complicate a URI. A URI can worsen asthma and chronic obstructive pulmonary disease (COPD). Sometimes, these complications can require emergency medical care and may be life threatening.  CAUSES Almost all URIs are caused by viruses. A virus is a type of germ and can spread from one person to another.  RISKS FACTORS You may be at risk for a URI if:   You smoke.   You have chronic heart or lung disease.  You have a weakened defense (immune) system.   You are very young or very old.   You have nasal allergies or asthma.  You work in crowded or poorly ventilated areas.  You work in health care facilities or schools. SIGNS AND SYMPTOMS  Symptoms typically develop 2-3 days after you come in contact with a cold virus. Most viral URIs last 7-10 days. However, viral URIs from the influenza virus (flu virus) can last 14-18 days and are typically more severe. Symptoms may include:   Runny or stuffy (congested) nose.   Sneezing.   Cough.   Sore throat.   Headache.   Fatigue.   Fever.   Loss of appetite.   Pain in your forehead, behind your eyes, and over your cheekbones (sinus pain).  Muscle aches.  DIAGNOSIS  Your health care provider may diagnose a URI by:  Physical exam.  Tests to check that your symptoms are not due to  another condition such as:  Strep throat.  Sinusitis.  Pneumonia.  Asthma. TREATMENT  A URI goes away on its own with time. It cannot be cured with medicines, but medicines may be prescribed or recommended to relieve symptoms. Medicines may help:  Reduce your fever.  Reduce your cough.  Relieve nasal congestion. HOME CARE INSTRUCTIONS   Take medicines only as directed by your health care provider.   Gargle warm saltwater or take cough drops to comfort your throat as directed by your health care provider.  Use a warm mist humidifier or inhale steam from a shower to increase air moisture. This may make it easier to breathe.  Drink enough fluid to keep your urine clear or pale yellow.   Eat soups and other clear broths and maintain good nutrition.   Rest as needed.   Return to work when your temperature has returned to normal or as your health care provider advises. You may need to stay home longer to avoid infecting others. You can also use a face mask and careful hand washing to prevent spread of the virus.  Increase the usage of your inhaler if you have asthma.   Do not use any tobacco products, including cigarettes, chewing tobacco, or electronic cigarettes. If you need help quitting, ask your health care provider. PREVENTION  The best way to protect yourself from getting a cold is to practice good hygiene.   Avoid oral or hand contact with people with cold   symptoms.   Wash your hands often if contact occurs.  There is no clear evidence that vitamin C, vitamin E, echinacea, or exercise reduces the chance of developing a cold. However, it is always recommended to get plenty of rest, exercise, and practice good nutrition.  SEEK MEDICAL CARE IF:   You are getting worse rather than better.   Your symptoms are not controlled by medicine.   You have chills.  You have worsening shortness of breath.  You have brown or red mucus.  You have yellow or brown nasal  discharge.  You have pain in your face, especially when you bend forward.  You have a fever.  You have swollen neck glands.  You have pain while swallowing.  You have white areas in the back of your throat. SEEK IMMEDIATE MEDICAL CARE IF:   You have severe or persistent:  Headache.  Ear pain.  Sinus pain.  Chest pain.  You have chronic lung disease and any of the following:  Wheezing.  Prolonged cough.  Coughing up blood.  A change in your usual mucus.  You have a stiff neck.  You have changes in your:  Vision.  Hearing.  Thinking.  Mood. MAKE SURE YOU:   Understand these instructions.  Will watch your condition.  Will get help right away if you are not doing well or get worse.   This information is not intended to replace advice given to you by your health care provider. Make sure you discuss any questions you have with your health care provider.   Document Released: 08/09/2000 Document Revised: 06/30/2014 Document Reviewed: 05/21/2013 Elsevier Interactive Patient Education 2016 Elsevier Inc.  

## 2015-02-16 NOTE — Progress Notes (Signed)
Patient ID: Amanda Curtis, female    DOB: 08-27-1935  Age: 79 y.o. MRN: [id number]    Subjective:  Subjective HPI Amanda Curtis presents for 4 day hx sore throat and nasal congestion.  Dry hacking cough.   Not taking anything but flonase for it.   No fever.  Review of Systems  Constitutional: Positive for chills. Negative for fever.  HENT: Positive for congestion, postnasal drip, rhinorrhea and sinus pressure.   Respiratory: Positive for cough, shortness of breath and wheezing. Negative for chest tightness.   Cardiovascular: Negative for chest pain, palpitations and leg swelling.  Allergic/Immunologic: Negative for environmental allergies.    History Past Medical History  Diagnosis Date  . Hypertension   . Arthritis   . Graves disease 2000    s/p RIA  . Diabetes mellitus without complication ([place])   . Insomnia     She has past surgical history that includes Appendectomy; Nasal septum surgery; Intraoprative transesophageal echocardiogram (N/A, 07/04/2013); Coronary artery bypass graft (N/A, 07/04/2013); and left heart catheterization with coronary angiogram (N/A, 06/30/2013).   Her family history includes Cancer in her mother; Dementia in her mother; Diabetes in her father and paternal grandmother; Hypertension in her paternal grandmother.She reports that she has never smoked. She has never used smokeless tobacco. She reports that she drinks alcohol. She reports that she does not use illicit drugs.  Current Outpatient Prescriptions on File Prior to Visit  Medication Sig Dispense Refill  . acetaminophen (TYLENOL) 325 MG tablet Take 2 tablets (650 mg total) by mouth every 6 (six) hours as needed for mild pain.    [name] amLODipine (NORVASC) 10 MG tablet Take 1 tablet (10 mg total) by mouth daily. 30 tablet 8  . aspirin EC 81 MG tablet Take 81 mg by mouth daily.    [name] EPINEPHrine 0.3 mg/0.3 mL IJ SOAJ injection Inject 0.3 mLs (0.3 mg total) into the muscle once. As needed for tongue/lip  swelling/shortness of breath with bee/wasp sting 2 Device 1  . estradiol (ESTRACE) 0.1 MG/GM vaginal cream Apply 1 gram per vagina every night for 2 weeks, then apply three times a week 30 g 12  . Fluticasone Propionate (FLONASE NA) Place 1 spray into the nose daily as needed (for allergies).     . losartan (COZAAR) 100 MG tablet take 1 tablet by mouth once daily 30 tablet 5  . pravastatin (PRAVACHOL) 40 MG tablet Take 1 tablet (40 mg total) by mouth every evening. 30 tablet 8  . SYNTHROID 100 MCG tablet take 1 tablet by mouth every morning 30 tablet 5  . ALPRAZolam (XANAX) 0.25 MG tablet take 1 tablet by mouth twice a day if needed for anxiety or sleep (Patient not taking: Reported on 02/16/2015) 45 tablet 0   No current facility-administered medications on file prior to visit.     Objective:  Objective Physical Exam  Constitutional: She is oriented to person, place, and time. She appears well-developed and well-nourished.  HENT:  Right Ear: External ear normal.  Left Ear: External ear normal.  Nose: Right sinus exhibits no maxillary sinus tenderness and no frontal sinus tenderness. Left sinus exhibits no maxillary sinus tenderness and no frontal sinus tenderness.  + PND + errythema  Eyes: Conjunctivae are normal. Right eye exhibits no discharge. Left eye exhibits no discharge.  Cardiovascular: Normal rate, regular rhythm and normal heart sounds.   No murmur heard. Pulmonary/Chest: Effort normal and breath sounds normal. No respiratory distress. She has no wheezes. She has no  rales. She exhibits no tenderness.  Musculoskeletal: She exhibits no edema.  Lymphadenopathy:    She has cervical adenopathy.  Neurological: She is alert and oriented to person, place, and time.  Nursing note and vitals reviewed.  BP 130/82 mmHg  Pulse 66  Temp(Src) 98.2 F (36.8 C) (Oral)  Ht 5\' 6"  (1.676 m)  Wt   SpO2 98% Wt Readings from Last 3 Encounters:  02/03/15 158 lb (71.668 kg)  01/13/15 158 lb  (71.668 kg)  12/30/14 158 lb (71.668 kg)     Lab Results  Component Value Date   WBC 11.4* 07/07/2013   HGB 9.1* 07/07/2013   HCT 28.5* 07/07/2013   PLT 209 07/07/2013   GLUCOSE 82 11/30/2014   CHOL 317* 11/30/2014   TRIG 258.0* 11/30/2014   HDL 49.70 11/30/2014   LDLDIRECT 227.0 11/30/2014   LDLCALC 206* 10/22/2013   ALT 15 07/03/2013   AST 22 07/03/2013   NA 136 11/30/2014   K 4.4 11/30/2014   CL 100 11/30/2014   CREATININE 1.16 11/30/2014   BUN 18 11/30/2014   CO2 28 11/30/2014   TSH 1.01 12/30/2014   INR 1.31 07/04/2013   HGBA1C 6.7* 11/30/2014   MICROALBUR <0.7 04/16/2014    No results found.   Assessment & Plan:  Plan I am having [name] start on levocetirizine, guaiFENesin-codeine, and fluticasone. I am also having her maintain her aspirin EC, Fluticasone Propionate (FLONASE NA), acetaminophen, ALPRAZolam, EPINEPHrine, amLODipine, pravastatin, losartan, SYNTHROID, and estradiol.  Meds ordered this encounter  Medications  . levocetirizine (XYZAL) 5 MG tablet    Sig: Take 1 tablet (5 mg total) by mouth every evening.    Dispense:  30 tablet    Refill:  5  . guaiFENesin-codeine (ROBITUSSIN AC) 100-10 MG/5ML syrup    Sig: Take 5 mLs by mouth 3 (three) times daily as needed for cough.    Dispense:  120 mL    Refill:  0  . fluticasone (FLONASE) 50 MCG/ACT nasal spray    Sig: Place 2 sprays into both nostrils daily.    Dispense:  16 g    Refill:  6    Problem List Items Addressed This Visit    None    Visit Diagnoses    Acute upper respiratory infection    -  Primary    Relevant Medications    levocetirizine (XYZAL) 5 MG tablet    guaiFENesin-codeine (ROBITUSSIN AC) 100-10 MG/5ML syrup    fluticasone (FLONASE) 50 MCG/ACT nasal spray    Cough        Relevant Medications    guaiFENesin-codeine (ROBITUSSIN AC) 100-10 MG/5ML syrup       Follow-up: Return if symptoms worsen or fail to improve.  Garnet Koyanagi, DO

## 2015-02-17 ENCOUNTER — Ambulatory Visit (INDEPENDENT_AMBULATORY_CARE_PROVIDER_SITE_OTHER): Payer: Medicare Other | Admitting: Family

## 2015-02-17 ENCOUNTER — Encounter: Payer: Self-pay | Admitting: Family

## 2015-02-17 VITALS — BP 157/82 | HR 78 | Temp 99.0°F | Resp 18 | Ht 66.0 in

## 2015-02-17 DIAGNOSIS — J069 Acute upper respiratory infection, unspecified: Secondary | ICD-10-CM

## 2015-02-17 MED ORDER — CEFDINIR 300 MG PO CAPS: 300.0000 mg | ORAL_CAPSULE | Freq: Two times a day (BID) | ORAL | Status: DC

## 2015-02-17 NOTE — Patient Instructions (Signed)
I think you have a viral upper respiratory infection.  However, if your sinus pressure/pain is not improved by Friday, I would like you to start cefdinir (antibiotic). Please call if symptoms worsen or if not improved in 1 week.

## 2015-02-17 NOTE — Progress Notes (Signed)
Pre visit review using our clinic review tool, if applicable. No additional management support is needed unless otherwise documented below in the visit note. 

## 2015-02-17 NOTE — Progress Notes (Signed)
Subjective:    Patient ID: Amanda Curtis, female    DOB: [birth year], 79 y.o.   MRN: [id number]  HPI  Ms.  Curtis is a 79 yr old female who presents today with d/o cough and nasal congestion. Symptoms began 5 days ago. Saw Dr. Etter Sjogren yesterday and was given rx for flonase robitussin AC.  Nasal drainage "all night long".  Reports frontal sinus pressure and maxillary sinus pressure L>R. Bilateral ear pain.  Denies fever, + chills. Reports feeling worse than she did yesterday. Cough syrup helped a little bit.     Review of Systems See HPI  Past Medical History  Diagnosis Date  . Hypertension   . Arthritis   . Graves disease 2000    s/p RIA  . Diabetes mellitus without complication ([place])   . Insomnia     Social History   Social History  . Marital Status: Widowed    Spouse Name: N/A  . Number of Children: N/A  . Years of Education: N/A   Occupational History  . Not on file.   Social History Main Topics  . Smoking status: Never Smoker   . Smokeless tobacco: Never Used  . Alcohol Use: Yes     Comment: 1 glass wine every 6 months  . Drug Use: No  . Sexual Activity: Not on file   Other Topics Concern  . Not on file   Social History Narrative   Widow   Retired worked for [employer] and did a lot of travelling for [employer].   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   She has 1 son- lives in [place]   3 grandsons- college age (one is in [place])             Past Surgical History  Procedure Laterality Date  . Appendectomy    . Nasal septum surgery      childhood  . Intraoperative transesophageal echocardiogram N/A 07/04/2013    Procedure: INTRAOPERATIVE TRANSESOPHAGEAL ECHOCARDIOGRAM;  Surgeon: Gaye Pollack, MD;  Location: [facility] OR;  Service: Open Heart Surgery;  Laterality: N/A;  . Coronary artery bypass graft N/A 07/04/2013    Procedure: CORONARY ARTERY BYPASS GRAFTING (CABG) times four  using left internal mammary artery and right saphenous leg vein.;  Surgeon: Gaye Pollack, MD;  Location: [facility] OR;  Service: Open Heart Surgery;  Laterality: N/A;  . Left heart catheterization with coronary angiogram N/A 06/30/2013    Procedure: LEFT HEART CATHETERIZATION WITH CORONARY ANGIOGRAM;  Surgeon: Blane Ohara, MD;  Location[facility] LAB;  Service: Cardiovascular;  Laterality: N/A;    Family History  Problem Relation Age of Onset  . Dementia Mother   . Cancer Mother     breast  . Diabetes Father   . Diabetes Paternal Grandmother   . Hypertension Paternal Grandmother     Allergies  Allergen Reactions  . Ampicillin     rash  . Atorvastatin Other (See Comments)    Severe muscle cramping  . Elavil [Amitriptyline] Other (See Comments)    Tongue swelling  . Hctz [Hydrochlorothiazide] Other (See Comments)    Severe leg cramps  . Hytrin [Terazosin] Other (See Comments)    syncope  . Wasp Venom Other (See Comments)    Hand swelling  . Erythromycin Rash    Current Outpatient Prescriptions on File Prior to Visit  Medication Sig Dispense Refill  . acetaminophen (TYLENOL) 325  MG tablet Take 2 tablets (650 mg total) by mouth every 6 (six) hours as needed for mild pain.    [name] ALPRAZolam (XANAX) 0.25 MG tablet take 1 tablet by mouth twice a day if needed for anxiety or sleep (Patient not taking: Reported on 02/16/2015) 45 tablet 0  . amLODipine (NORVASC) 10 MG tablet Take 1 tablet (10 mg total) by mouth daily. 30 tablet 8  . aspirin EC 81 MG tablet Take 81 mg by mouth daily.    [name] EPINEPHrine 0.3 mg/0.3 mL IJ SOAJ injection Inject 0.3 mLs (0.3 mg total) into the muscle once. As needed for tongue/lip swelling/shortness of breath with bee/wasp sting 2 Device 1  . estradiol (ESTRACE) 0.1 MG/GM vaginal cream Apply 1 gram per vagina every night for 2 weeks, then apply three times a week 30 g 12  . fluticasone (FLONASE) 50 MCG/ACT nasal spray Place 2 sprays into both nostrils daily. 16 g 6    . Fluticasone Propionate (FLONASE NA) Place 1 spray into the nose daily as needed (for allergies).     [name] guaiFENesin-codeine (ROBITUSSIN AC) 100-10 MG/5ML syrup Take 5 mLs by mouth 3 (three) times daily as needed for cough. 120 mL 0  . levocetirizine (XYZAL) 5 MG tablet Take 1 tablet (5 mg total) by mouth every evening. 30 tablet 5  . losartan (COZAAR) 100 MG tablet take 1 tablet by mouth once daily 30 tablet 5  . pravastatin (PRAVACHOL) 40 MG tablet Take 1 tablet (40 mg total) by mouth every evening. 30 tablet 8  . SYNTHROID 100 MCG tablet take 1 tablet by mouth every morning 30 tablet 5   No current facility-administered medications on file prior to visit.    BP 157/82 mmHg  Pulse 78  Temp(Src) 99 F (37.2 C) (Oral)  Resp 18  Ht 5\' 6"  (1.676 m)  Wt   SpO2 99%       Objective:   Physical Exam  Constitutional: She is oriented to person, place, and time. She appears well-developed and well-nourished.  HENT:  Head: Normocephalic and atraumatic.  Right Ear: Tympanic membrane and ear canal normal.  Left Ear: Tympanic membrane and ear canal normal.  Mouth/Throat: No oropharyngeal exudate, posterior oropharyngeal edema or posterior oropharyngeal erythema.  Mild frontal/maxillary sinusitis  Cardiovascular: Normal rate, regular rhythm and normal heart sounds.   No murmur heard. Pulmonary/Chest: Effort normal and breath sounds normal. No respiratory distress. She has no wheezes.  Lymphadenopathy:    She has no cervical adenopathy.  Neurological: She is alert and oriented to person, place, and time.  Skin: Skin is warm and dry.  Psychiatric: She has a normal mood and affect. Her behavior is normal. Judgment and thought content normal.          Assessment & Plan:  URI- symptoms most consistent with viral URI at this point, however if her sinus pain/pressure persists, I recommended that she begin cefdinir on Friday. Continue flonase, continue robitussin AC prn. Follow up if  symptoms worsen or do not improve.

## 2015-03-08 ENCOUNTER — Ambulatory Visit: Payer: Medicare Other | Admitting: Family

## 2015-03-08 ENCOUNTER — Ambulatory Visit: Payer: Medicare Other | Admitting: Obstetrics & Gynecology

## 2015-03-12 ENCOUNTER — Encounter: Payer: Self-pay | Admitting: Family

## 2015-03-12 ENCOUNTER — Ambulatory Visit (INDEPENDENT_AMBULATORY_CARE_PROVIDER_SITE_OTHER): Payer: Medicare Other | Admitting: Family

## 2015-03-12 ENCOUNTER — Other Ambulatory Visit: Payer: Self-pay | Admitting: Family

## 2015-03-12 VITALS — BP 137/78 | HR 63 | Temp 98.4°F | Resp 16 | Ht 66.0 in | Wt 163.0 lb

## 2015-03-12 DIAGNOSIS — D649 Anemia, unspecified: Secondary | ICD-10-CM

## 2015-03-12 DIAGNOSIS — N905 Atrophy of vulva: Secondary | ICD-10-CM

## 2015-03-12 DIAGNOSIS — E1151 Type 2 diabetes mellitus with diabetic peripheral angiopathy without gangrene: Secondary | ICD-10-CM | POA: Diagnosis not present

## 2015-03-12 DIAGNOSIS — I10 Essential (primary) hypertension: Secondary | ICD-10-CM

## 2015-03-12 DIAGNOSIS — Z862 Personal history of diseases of the blood and blood-forming organs and certain disorders involving the immune mechanism: Secondary | ICD-10-CM | POA: Diagnosis not present

## 2015-03-12 DIAGNOSIS — E118 Type 2 diabetes mellitus with unspecified complications: Secondary | ICD-10-CM | POA: Diagnosis not present

## 2015-03-12 DIAGNOSIS — E785 Hyperlipidemia, unspecified: Secondary | ICD-10-CM

## 2015-03-12 DIAGNOSIS — R5383 Other fatigue: Secondary | ICD-10-CM

## 2015-03-12 LAB — BASIC METABOLIC PANEL
BUN: 21 mg/dL (ref 7–25)
CO2: 27 mmol/L (ref 20–31)
CREATININE: 1.25 mg/dL — AB (ref 0.60–0.93)
Calcium: 9.5 mg/dL (ref 8.6–10.4)
Chloride: 103 mmol/L (ref 98–110)
Glucose, Bld: 98 mg/dL (ref 65–99)
POTASSIUM: 5.3 mmol/L (ref 3.5–5.3)
SODIUM: 137 mmol/L (ref 135–146)

## 2015-03-12 LAB — CBC WITH DIFFERENTIAL/PLATELET
Basophils Absolute: 0.2 10*3/uL — ABNORMAL HIGH (ref 0.0–0.1)
Basophils Relative: 2 % — ABNORMAL HIGH (ref 0–1)
EOS PCT: 2 % (ref 0–5)
Eosinophils Absolute: 0.2 10*3/uL (ref 0.0–0.7)
HCT: 38.1 % (ref 36.0–46.0)
HEMOGLOBIN: 12.3 g/dL (ref 12.0–15.0)
LYMPHS ABS: 2.7 10*3/uL (ref 0.7–4.0)
Lymphocytes Relative: 26 % (ref 12–46)
MCH: 29.1 pg (ref 26.0–34.0)
MCHC: 32.3 g/dL (ref 30.0–36.0)
MCV: 90.3 fL (ref 78.0–100.0)
MONO ABS: 0.8 10*3/uL (ref 0.1–1.0)
MONOS PCT: 8 % (ref 3–12)
MPV: 9.2 fL (ref 8.6–12.4)
NEUTROS ABS: 6.3 10*3/uL (ref 1.7–7.7)
Neutrophils Relative %: 62 % (ref 43–77)
Platelets: 474 10*3/uL — ABNORMAL HIGH (ref 150–400)
RBC: 4.22 MIL/uL (ref 3.87–5.11)
RDW: 16.6 % — AB (ref 11.5–15.5)
WBC: 10.2 10*3/uL (ref 4.0–10.5)

## 2015-03-12 NOTE — Progress Notes (Signed)
Subjective:    Patient ID: Amanda Curtis, female    DOB: 08-20-1935, 80 y.o.   MRN: [id number]  HPI  Amanda Curtis is a 80 yr old female who presents today for follow up.  1) DM2- This is diet controlled- admits to some dietary indiscretion over the holidays. Wants to return to exercising.   Lab Results  Component Value Date   HGBA1C 6.7* 11/30/2014   HGBA1C 7.0* 04/16/2014   HGBA1C 6.7* 12/23/2013   Lab Results  Component Value Date   MICROALBUR <0.7 04/16/2014   LDLCALC 206* 10/22/2013   CREATININE 1.16 11/30/2014   2) HTN- currently maintained on losartan and amlodipine.  BP Readings from Last 3 Encounters:  03/12/15 137/78  02/17/15 157/82  02/16/15 130/82   3) Hyperlipidemia- patient is maintained on pravastatin. She has follow up in the Lipid clinic 1/16.   Lab Results  Component Value Date   CHOL 317* 11/30/2014   HDL 49.70 11/30/2014   LDLCALC 206* 10/22/2013   LDLDIRECT 227.0 11/30/2014   TRIG 258.0* 11/30/2014   CHOLHDL 6 11/30/2014   4) Hypothyroid- continues synthroid. She does describe low energy.  Lab Results  Component Value Date   TSH 1.01 12/30/2014      Review of Systems  Cardiovascular:       Notes some LE edema which she attributes to amlodipine      see HPI  Past Medical History  Diagnosis Date  . Hypertension   . Arthritis   . Graves disease 2000    s/p RIA  . Diabetes mellitus without complication ([place])   . Insomnia     Social History   Social History  . Marital Status: Widowed    Spouse Name: N/A  . Number of Children: N/A  . Years of Education: N/A   Occupational History  . Not on file.   Social History Main Topics  . Smoking status: Never Smoker   . Smokeless tobacco: Never Used  . Alcohol Use: Yes     Comment: 1 glass wine every 6 months  . Drug Use: No  . Sexual Activity: Not on file   Other Topics Concern  . Not on file   Social History Narrative   Widow   Retired worked for [employer] and did a lot of  travelling for [employer].   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   She has 1 son- lives in [place]   3 grandsons- college age (one is in [place])             Past Surgical History  Procedure Laterality Date  . Appendectomy    . Nasal septum surgery      childhood  . Intraoperative transesophageal echocardiogram N/A 07/04/2013    Procedure: INTRAOPERATIVE TRANSESOPHAGEAL ECHOCARDIOGRAM;  Surgeon: Gaye Pollack, MD;  Location: [facility] OR;  Service: Open Heart Surgery;  Laterality: N/A;  . Coronary artery bypass graft N/A 07/04/2013    Procedure: CORONARY ARTERY BYPASS GRAFTING (CABG) times four using left internal mammary artery and right saphenous leg vein.;  Surgeon: Gaye Pollack, MD;  Location: [facility] OR;  Service: Open Heart Surgery;  Laterality: N/A;  . Left heart catheterization with coronary angiogram N/A 06/30/2013    Procedure: LEFT HEART CATHETERIZATION WITH CORONARY ANGIOGRAM;  Surgeon: Blane Ohara, MD;  Location: [facility] CATH LAB;  Service: Cardiovascular;  Laterality: N/A;    Family History  Problem Relation Age of Onset  . Dementia Mother   . Cancer Mother     breast  . Diabetes Father   . Diabetes Paternal Grandmother   . Hypertension Paternal Grandmother     Allergies  Allergen Reactions  . Ampicillin     rash  . Atorvastatin Other (See Comments)    Severe muscle cramping  . Codeine Other (See Comments)    dizziness  . Elavil [Amitriptyline] Other (See Comments)    Tongue swelling  . Hctz [Hydrochlorothiazide] Other (See Comments)    Severe leg cramps  . Hytrin [Terazosin] Other (See Comments)    syncope  . Wasp Venom Other (See Comments)    Hand swelling  . Erythromycin Rash    Current Outpatient Prescriptions on File Prior to Visit  Medication Sig Dispense Refill  . acetaminophen (TYLENOL) 325 MG tablet Take 2 tablets (650 mg total) by mouth every 6 (six) hours as needed  for mild pain.    [name] ALPRAZolam (XANAX) 0.25 MG tablet take 1 tablet by mouth twice a day if needed for anxiety or sleep 45 tablet 0  . amLODipine (NORVASC) 10 MG tablet Take 1 tablet (10 mg total) by mouth daily. 30 tablet 8  . aspirin EC 81 MG tablet Take 81 mg by mouth daily.    [name] EPINEPHrine 0.3 mg/0.3 mL IJ SOAJ injection Inject 0.3 mLs (0.3 mg total) into the muscle once. As needed for tongue/lip swelling/shortness of breath with bee/wasp sting 2 Device 1  . fluticasone (FLONASE) 50 MCG/ACT nasal spray Place 2 sprays into both nostrils daily. 16 g 6  . Fluticasone Propionate (FLONASE NA) Place 1 spray into the nose daily as needed (for allergies).     [name] levocetirizine (XYZAL) 5 MG tablet Take 1 tablet (5 mg total) by mouth every evening. 30 tablet 5  . losartan (COZAAR) 100 MG tablet take 1 tablet by mouth once daily 30 tablet 5  . pravastatin (PRAVACHOL) 40 MG tablet Take 1 tablet (40 mg total) by mouth every evening. 30 tablet 8  . SYNTHROID 100 MCG tablet take 1 tablet by mouth every morning 30 tablet 5  . estradiol (ESTRACE) 0.1 MG/GM vaginal cream Apply 1 gram per vagina every night for 2 weeks, then apply three times a week (Patient not taking: Reported on 03/12/2015) 30 g 12   No current facility-administered medications on file prior to visit.    BP 137/78 mmHg  Pulse 63  Temp(Src) 98.4 F (36.9 C) (Oral)  Resp 16  Ht 5\' 6"  (1.676 m)  Wt 163 lb (73.936 kg)  BMI 26.32 kg/m2  SpO2 99%    Objective:   Physical Exam  Constitutional: She is oriented to person, place, and time. She appears well-developed and well-nourished.  HENT:  Head: Normocephalic and atraumatic.  Cardiovascular: Normal rate, regular rhythm and normal heart sounds.   No murmur heard. Pulmonary/Chest: Effort normal and breath sounds normal. No respiratory distress. She has no wheezes.  Musculoskeletal: She exhibits no edema.  Neurological: She is alert and oriented to person, place, and time.    Psychiatric: She has a normal mood and affect. Her behavior is normal. Judgment and thought content normal.          Assessment & Plan:  Hx of Anemia- will obtain CBC to further evaluate due to pt c/o low energy.  Lab Results  Component Value Date   WBC 11.4* 07/07/2013   HGB 9.1* 07/07/2013   HCT 28.5* 07/07/2013  MCV 96.9 07/07/2013   PLT 209 07/07/2013

## 2015-03-12 NOTE — Assessment & Plan Note (Signed)
Pt reports that she tried estrace cream but this caused burning. I encouraged her to discuss with her GYN.

## 2015-03-12 NOTE — Assessment & Plan Note (Signed)
I encouraged her to keep her upcoming appointment with lipid clinic next week.

## 2015-03-12 NOTE — Progress Notes (Signed)
Pre visit review using our clinic review tool, if applicable. No additional management support is needed unless otherwise documented below in the visit note. 

## 2015-03-12 NOTE — Patient Instructions (Addendum)
Please complete lab work prior to leaving.   Please keep your upcoming appointment with the Lipid clinic for your cholesterol.

## 2015-03-12 NOTE — Assessment & Plan Note (Signed)
Clinically stable, obtain A1C, bmet. Urine microalbumin. We discussed healthy diet, exercise.

## 2015-03-12 NOTE — Assessment & Plan Note (Signed)
BP stable on current meds. Continue same.  

## 2015-03-13 LAB — MICROALBUMIN / CREATININE URINE RATIO
Creatinine, Urine: 139 mg/dL (ref 20–320)
MICROALB UR: 0.5 mg/dL
MICROALB/CREAT RATIO: 4 ug/mg{creat} (ref <30)

## 2015-03-15 ENCOUNTER — Other Ambulatory Visit (INDEPENDENT_AMBULATORY_CARE_PROVIDER_SITE_OTHER): Payer: Medicare Other | Admitting: *Deleted

## 2015-03-15 DIAGNOSIS — E785 Hyperlipidemia, unspecified: Secondary | ICD-10-CM | POA: Diagnosis not present

## 2015-03-15 DIAGNOSIS — I10 Essential (primary) hypertension: Secondary | ICD-10-CM

## 2015-03-15 LAB — LIPID PANEL
CHOL/HDL RATIO: 6.7 ratio — AB (ref <=5.0)
Cholesterol: 280 mg/dL — ABNORMAL HIGH (ref 125–200)
HDL: 42 mg/dL — AB (ref >=46)
LDL Cholesterol: 191 mg/dL — ABNORMAL HIGH (ref <130)
Triglycerides: 234 mg/dL — ABNORMAL HIGH (ref <150)
VLDL: 47 mg/dL — ABNORMAL HIGH (ref <30)

## 2015-03-15 LAB — HEPATIC FUNCTION PANEL
ALBUMIN: 4.1 g/dL (ref 3.6–5.1)
ALK PHOS: 83 U/L (ref 33–130)
ALT: 10 U/L (ref 6–29)
AST: 16 U/L (ref 10–35)
BILIRUBIN DIRECT: 0.1 mg/dL (ref <=0.2)
BILIRUBIN TOTAL: 0.4 mg/dL (ref 0.2–1.2)
Indirect Bilirubin: 0.3 mg/dL (ref 0.2–1.2)
Total Protein: 7.7 g/dL (ref 6.1–8.1)

## 2015-03-15 NOTE — Addendum Note (Signed)
Addended by: Eulis Foster on: 03/15/2015 09:08 AM   Modules accepted: Orders

## 2015-03-17 LAB — HEMOGLOBIN A1C
HEMOGLOBIN A1C: 6.9 % — AB (ref <5.7)
MEAN PLASMA GLUCOSE: 151 mg/dL — AB (ref <117)

## 2015-03-18 ENCOUNTER — Encounter: Payer: Self-pay | Admitting: Family

## 2015-03-18 ENCOUNTER — Telehealth: Payer: Self-pay | Admitting: General Practice

## 2015-03-18 NOTE — Telephone Encounter (Signed)
Spoke with patient about pravachol.  Patient said that she is not taking it right now because she is out.  I asked patient if she is planning to get it re-filled.  I did not get a straight answer.  Patient did say that she "hates" statin drugs and that she doesn't think they are good for her.  Patient was referred to a lipid clinic on 1/22 in which she cancelled the appointment.  Message sent to Debbrah Alar to find out how to proceed in closing this medication adherence gap.

## 2015-03-22 ENCOUNTER — Telehealth: Payer: Self-pay | Admitting: General Practice

## 2015-03-22 ENCOUNTER — Ambulatory Visit: Payer: Medicare Other | Admitting: Pharmacist

## 2015-03-22 NOTE — Telephone Encounter (Signed)
-----   Message from Sherren Mocha, MD sent at 03/19/2015  4:26 PM EST ----- Regarding: RE: Medication Adherence Yes - I just reviewed her cholesterol earlier today and we are going to call her to encourage a Lipid Clinic appt again. thx ----- Message -----    From: Warden Fillers, RN    Sent: 03/19/2015   1:16 PM      To: Sherren Mocha, MD Subject: Medication Adherence                           Hi Dr. Burt Knack,  I am working on closing medication adherence gaps for [facility].  This patient is not taking her cholesterol medication and doesn't plan to get re-fills.  It also looks like she cancelled an appointment for the lipid clinic for next week.  Her cholesterol medication was filled less that 45% of the time over the last 2 years.  Would you consider discontinuing this medication?  Thanks, Villa Herb, RN [facility]

## 2015-03-23 ENCOUNTER — Encounter: Payer: Self-pay | Admitting: Cardiovascular Disease

## 2015-03-23 NOTE — Telephone Encounter (Signed)
This encounter was created in error - please disregard.

## 2015-03-23 NOTE — Telephone Encounter (Signed)
Follow Up  Pt returning call from what she was told to be URGENT Please call back to discuss.

## 2015-03-24 ENCOUNTER — Telehealth: Payer: Self-pay | Admitting: Family

## 2015-03-24 NOTE — Telephone Encounter (Signed)
Caller name: Raahi   Relationship to patient: Self  Can be reached: [phone]  Reason for call: Pt called back requesting her lab results.

## 2015-03-24 NOTE — Telephone Encounter (Signed)
Notified pt per 03/18/15 lab letter.

## 2015-03-25 ENCOUNTER — Ambulatory Visit (INDEPENDENT_AMBULATORY_CARE_PROVIDER_SITE_OTHER): Payer: Medicare Other | Admitting: Pharmacist

## 2015-03-25 DIAGNOSIS — E785 Hyperlipidemia, unspecified: Secondary | ICD-10-CM | POA: Diagnosis not present

## 2015-03-25 MED ORDER — ROSUVASTATIN CALCIUM 10 MG PO TABS
10.0000 mg | ORAL_TABLET | Freq: Every day | ORAL | Status: DC
Start: 2015-03-25 — End: 2015-04-19

## 2015-03-25 NOTE — Progress Notes (Signed)
Patient ID: ZAILY WRITT                 DOB: [birth year], 80 yo                         MRN: [id number]     HPI: Amanda Curtis is a very pleasant 80 y.o. female patient referred to lipid clinic by Dr. Burt Knack. PMH is significant for CAD s/p CABG in May 2015, HLD, HTN, and bilateral carotid stenosis. Most recently, pt cut back her dose of pravastatin from 80mg  to 40mg  for tolerability reasons. She reports that she ran out of pravastatin about 3 months ago, but that so far she was feeling ok on the lower dose.   Current Medications: none Intolerances: Lipitor 20mg  (2014) and 80mg  (2015), Livalo 2mg  (2015), pravastatin 80mg  (2016) and 40mg  (2016) - myalgias in her legs. Cramping with Lipitor was so bad that she could not walk. Risk Factors: CABG in 2015, LDL of 191 LDL goal: 70mg /dL  Diet: Breakfast - oatmeal or cream of wheat, sometimes eggs. Lunch - salad with chicken or soup. Dinner - fish (salmon), trying to cut red meat out and limit pork and bacon. Eats a lot of veggies and fruit. Drinks - trying to cut out regular Coke, drinking more water.   Exercise: [facility] [employer], wants to switch to [employer] - weight lifting.   Family History: Cancer and dementia in her mother, DM in her father and paternal grandmother, HTN in her paternal grandmother.  Social History: Patient reports that she has never smoked. She occasionally drinks wine, and does not use illicit drugs.  Labs: 02/2015: TC 280, TG 234, HDL 42, LDL 191, LFTS wnl (no therapy)  Past Medical History  Diagnosis Date  . Hypertension   . Arthritis   . Graves disease 2000    s/p RIA  . Diabetes mellitus without complication ([place])   . Insomnia     Current Outpatient Prescriptions on File Prior to Visit  Medication Sig Dispense Refill  . acetaminophen (TYLENOL) 325 MG tablet Take 2 tablets (650 mg total) by mouth every 6 (six) hours as needed for mild pain.    [name] ALPRAZolam (XANAX) 0.25 MG tablet take 1 tablet by mouth twice a  day if needed for anxiety or sleep 45 tablet 0  . amLODipine (NORVASC) 10 MG tablet Take 1 tablet (10 mg total) by mouth daily. 30 tablet 8  . aspirin EC 81 MG tablet Take 81 mg by mouth daily.    [name] EPINEPHrine 0.3 mg/0.3 mL IJ SOAJ injection Inject 0.3 mLs (0.3 mg total) into the muscle once. As needed for tongue/lip swelling/shortness of breath with bee/wasp sting 2 Device 1  . estradiol (ESTRACE) 0.1 MG/GM vaginal cream Apply 1 gram per vagina every night for 2 weeks, then apply three times a week (Patient not taking: Reported on 03/12/2015) 30 g 12  . fluticasone (FLONASE) 50 MCG/ACT nasal spray Place 2 sprays into both nostrils daily. 16 g 6  . Fluticasone Propionate (FLONASE NA) Place 1 spray into the nose daily as needed (for allergies).     [name] levocetirizine (XYZAL) 5 MG tablet Take 1 tablet (5 mg total) by mouth every evening. 30 tablet 5  . losartan (COZAAR) 100 MG tablet take 1 tablet by mouth once daily 30 tablet 5  . pravastatin (PRAVACHOL) 40 MG tablet Take 1 tablet (40 mg total) by mouth every evening. 30 tablet 8  .  SYNTHROID 100 MCG tablet take 1 tablet by mouth every morning 30 tablet 5   No current facility-administered medications on file prior to visit.    Allergies  Allergen Reactions  . Ampicillin     rash  . Atorvastatin Other (See Comments)    Severe muscle cramping  . Codeine Other (See Comments)    dizziness  . Elavil [Amitriptyline] Other (See Comments)    Tongue swelling  . Hctz [Hydrochlorothiazide] Other (See Comments)    Severe leg cramps  . Hytrin [Terazosin] Other (See Comments)    syncope  . Wasp Venom Other (See Comments)    Hand swelling  . Erythromycin Rash    Assessment/Plan:  1. Hyperlipidemia - LDL of 191 far above goal <70 given history of CABG. Discussed PCSK9i as an option since she is intolerant to multiple statins, but copay on Medicare will be a few hundred dollars a month and pt cannot afford this. Will start Crestor 10mg  daily and try  to titrate her to highest max tolerated statin dose. If she is intolerant to low dose Crestor, could switch back to pravastatin 40mg  and add on Zetia in the future for maximum lipid lowering with available current therapies.    Enes Rokosz E. Nekoda Chock, PharmD, [place] [id number], [place], [place] [address] Phone: [phone]; Fax: [phone] 03/25/2015 1:02 PM

## 2015-03-25 NOTE — Patient Instructions (Signed)
Start taking Crestor 10mg  daily. Call Anarely Nicholls in lipid clinic if you have trouble tolerating it [phone]. Call clinic in a month or two to let us know how you tolerate Crestor - we can increase or decrease the dose as needed. We can also try Zetia in the future as well. Cut back on regular sodas - this will help to lower your triglycerides.

## 2015-04-19 ENCOUNTER — Other Ambulatory Visit: Payer: Self-pay

## 2015-04-19 MED ORDER — ROSUVASTATIN CALCIUM 10 MG PO TABS: 10.0000 mg | ORAL_TABLET | Freq: Every day | ORAL | Status: DC

## 2015-05-07 ENCOUNTER — Encounter: Payer: Self-pay | Admitting: Family Medicine

## 2015-05-07 ENCOUNTER — Ambulatory Visit (INDEPENDENT_AMBULATORY_CARE_PROVIDER_SITE_OTHER): Payer: Medicare Other | Admitting: Family Medicine

## 2015-05-07 VITALS — BP 130/76 | HR 111 | Temp 98.3°F | Resp 17 | Ht 66.0 in

## 2015-05-07 DIAGNOSIS — R300 Dysuria: Secondary | ICD-10-CM | POA: Diagnosis not present

## 2015-05-07 DIAGNOSIS — R82998 Other abnormal findings in urine: Secondary | ICD-10-CM

## 2015-05-07 DIAGNOSIS — R319 Hematuria, unspecified: Secondary | ICD-10-CM | POA: Diagnosis not present

## 2015-05-07 DIAGNOSIS — N390 Urinary tract infection, site not specified: Secondary | ICD-10-CM | POA: Diagnosis not present

## 2015-05-07 LAB — POC URINALSYSI DIPSTICK (AUTOMATED)
BILIRUBIN UA: NEGATIVE
GLUCOSE UA: NEGATIVE
KETONES UA: NEGATIVE
Nitrite, UA: NEGATIVE
Protein, UA: NEGATIVE
SPEC GRAV UA: 1.02
Urobilinogen, UA: 0.2
pH, UA: 6

## 2015-05-07 MED ORDER — CEPHALEXIN 500 MG PO CAPS: 500.0000 mg | ORAL_CAPSULE | Freq: Two times a day (BID) | ORAL | Status: AC

## 2015-05-07 NOTE — Assessment & Plan Note (Signed)
Recurrent issue for pt.  UA consistent w/ infxn.  Start abx- pt has taken Keflex successfully in the past despite Ampicillin allergy.  Reviewed supportive care and red flags that should prompt return.  Pt expressed understanding and is in agreement w/ plan.

## 2015-05-07 NOTE — Patient Instructions (Signed)
Follow up as needed Drink plenty of fluids Start the Keflex twice daily for the bladder infection- you've taken this before REST! Call with any questions or concerns Hang in there!!!

## 2015-05-07 NOTE — Progress Notes (Signed)
   Subjective:    Patient ID: Amanda Curtis, female    DOB: [birth year], 80 y.o.   MRN: [id number]  HPI Dysuria- 'i just know i burn'.  sxs started ~2 weeks ago.  Burning prior to urination, w/ urination and after urination.  Worse at night.  Pt has increased her fluid intake.  Increased frequency.  + urgency.  Sense of incomplete emptying.  Intermittent suprapubic pain/pressure.  Some low back pain.   Review of Systems For ROS see HPI     Objective:   Physical Exam  Constitutional: She is oriented to person, place, and time. She appears well-developed and well-nourished. No distress.  Abdominal: Soft. She exhibits no distension. There is no tenderness (no suprapubic or CVA tenderness).  Neurological: She is alert and oriented to person, place, and time.  Skin: Skin is warm and dry.  Psychiatric: She has a normal mood and affect. Her behavior is normal. Thought content normal.  Vitals reviewed.         Assessment & Plan:

## 2015-05-07 NOTE — Progress Notes (Signed)
Pre visit review using our clinic review tool, if applicable. No additional management support is needed unless otherwise documented below in the visit note. 

## 2015-05-08 LAB — URINE CULTURE

## 2015-05-10 ENCOUNTER — Telehealth: Payer: Self-pay | Admitting: Family

## 2015-05-10 NOTE — Telephone Encounter (Signed)
-----   Message from Midge Minium, MD sent at 05/09/2015 10:23 AM EDT ----- No obvious UTI but urine had microscopic hematuria and leuks.  Due to recurrent sxs, may need Urology referral if she again has sxs (will defer to PCP)

## 2015-05-10 NOTE — Telephone Encounter (Signed)
Please let pt know re: findings below. I had referred her to urology previously but she no showed her appointment with [facility] back in December. If she is willing to reschedule, I will re-initiate.  I think it would be a good idea to look further into the blood in her urine.

## 2015-05-11 NOTE — Telephone Encounter (Signed)
Notified pt and she is agreeable to see urology. Gave pt contact # for [facility] and she will call them to schedule appt.  Pt also states that she seems to be burning more with urination since starting the cephalexin. Has taken 3 days. Per verbal from PCP, ok to stop antibiotic and if dysuria does not resolve pt should let us know so we can re-culture her urine. Pt voices understanding.

## 2015-05-18 ENCOUNTER — Ambulatory Visit (INDEPENDENT_AMBULATORY_CARE_PROVIDER_SITE_OTHER): Payer: Medicare Other | Admitting: Family

## 2015-05-18 ENCOUNTER — Encounter: Payer: Self-pay | Admitting: Family

## 2015-05-18 VITALS — BP 112/70 | HR 61 | Resp 20 | Ht 66.0 in | Wt 160.8 lb

## 2015-05-18 DIAGNOSIS — R300 Dysuria: Secondary | ICD-10-CM | POA: Diagnosis not present

## 2015-05-18 DIAGNOSIS — F32A Depression, unspecified: Secondary | ICD-10-CM

## 2015-05-18 DIAGNOSIS — F329 Major depressive disorder, single episode, unspecified: Secondary | ICD-10-CM

## 2015-05-18 DIAGNOSIS — R5382 Chronic fatigue, unspecified: Secondary | ICD-10-CM | POA: Diagnosis not present

## 2015-05-18 DIAGNOSIS — I10 Essential (primary) hypertension: Secondary | ICD-10-CM | POA: Diagnosis not present

## 2015-05-18 DIAGNOSIS — Z0000 Encounter for general adult medical examination without abnormal findings: Secondary | ICD-10-CM

## 2015-05-18 DIAGNOSIS — N952 Postmenopausal atrophic vaginitis: Secondary | ICD-10-CM | POA: Diagnosis not present

## 2015-05-18 LAB — POC URINALSYSI DIPSTICK (AUTOMATED)
GLUCOSE UA: NEGATIVE
NITRITE UA: NEGATIVE
RBC UA: NEGATIVE
Spec Grav, UA: >=1.03
UROBILINOGEN UA: NEGATIVE
pH, UA: 6

## 2015-05-18 LAB — CBC WITH DIFFERENTIAL/PLATELET
BASOS ABS: 0.1 10*3/uL (ref 0.0–0.1)
BASOS PCT: 0.9 % (ref 0.0–3.0)
EOS ABS: 0.2 10*3/uL (ref 0.0–0.7)
Eosinophils Relative: 1.7 % (ref 0.0–5.0)
HEMATOCRIT: 39 % (ref 36.0–46.0)
HEMOGLOBIN: 12.5 g/dL (ref 12.0–15.0)
LYMPHS PCT: 24.9 % (ref 12.0–46.0)
Lymphs Abs: 2.4 10*3/uL (ref 0.7–4.0)
MCHC: 31.9 g/dL (ref 30.0–36.0)
MCV: 90.7 fl (ref 78.0–100.0)
Monocytes Absolute: 0.9 10*3/uL (ref 0.1–1.0)
Monocytes Relative: 9.2 % (ref 3.0–12.0)
Neutro Abs: 6 10*3/uL (ref 1.4–7.7)
Neutrophils Relative %: 63.3 % (ref 43.0–77.0)
Platelets: 476 10*3/uL — ABNORMAL HIGH (ref 150.0–400.0)
RBC: 4.3 Mil/uL (ref 3.87–5.11)
RDW: 17.1 % — ABNORMAL HIGH (ref 11.5–15.5)
WBC: 9.5 10*3/uL (ref 4.0–10.5)

## 2015-05-18 LAB — BASIC METABOLIC PANEL
BUN: 28 mg/dL — ABNORMAL HIGH (ref 6–23)
CALCIUM: 9.6 mg/dL (ref 8.4–10.5)
CO2: 26 meq/L (ref 19–32)
Chloride: 104 mEq/L (ref 96–112)
Creatinine, Ser: 1.09 mg/dL (ref 0.40–1.20)
GFR: 51.58 mL/min — AB (ref >60.00)
Glucose, Bld: 80 mg/dL (ref 70–99)
Potassium: 4.3 mEq/L (ref 3.5–5.1)
SODIUM: 137 meq/L (ref 135–145)

## 2015-05-18 LAB — TSH: TSH: 0.68 u[IU]/mL (ref 0.35–4.50)

## 2015-05-18 LAB — VITAMIN D 25 HYDROXY (VIT D DEFICIENCY, FRACTURES): VITD: 16.85 ng/mL — ABNORMAL LOW (ref 30.00–100.00)

## 2015-05-18 NOTE — Patient Instructions (Addendum)
Please schedule a follow up appointment with GYN. Please schedule an appointment with Urology- [phone] Follow up in 3 months.

## 2015-05-18 NOTE — Progress Notes (Signed)
Subjective:    Patient ID: Amanda Curtis, female    DOB: May 05, 1935, 80 y.o.   MRN: [id number]  HPI  [name] is a 80 yr old female who presents today for follow up. She saw Dr. Birdie Riddle on 05/07/15 with c/o dysuria.  She was placed on Keflex.  She called back on 05/10/15 with report of worsening dysuria after beginning keflex. Urine culture was negative and she was advised to d/c abx.  She was initially referred to urology, but reports that she has burning on the labia.  She used monistat which made her symptoms worse.   She reports overall poor energy,   Wt Readings from Last 3 Encounters:  05/18/15 160 lb 12.8 oz (72.938 kg)  03/12/15 163 lb (73.936 kg)  02/03/15 158 lb (71.668 kg)   Depression- reports that she feels depressed sometimes but tries to stay positive.   Review of Systems See HPI  Past Medical History  Diagnosis Date  . Hypertension   . Arthritis   . Graves disease 2000    s/p RIA  . Diabetes mellitus without complication ([place])   . Insomnia     Social History   Social History  . Marital Status: Widowed    Spouse Name: N/A  . Number of Children: N/A  . Years of Education: N/A   Occupational History  . Not on file.   Social History Main Topics  . Smoking status: Never Smoker   . Smokeless tobacco: Never Used  . Alcohol Use: Yes     Comment: 1 glass wine every 6 months  . Drug Use: No  . Sexual Activity: Not on file   Other Topics Concern  . Not on file   Social History Narrative   Widow   Retired worked for [employer] and did a lot of travelling for [employer].   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   She has 1 son- lives in [place]   3 grandsons- college age (one is in [place])             Past Surgical History  Procedure Laterality Date  . Appendectomy    . Nasal septum surgery      childhood  . Intraoperative transesophageal echocardiogram N/A  07/04/2013    Procedure: INTRAOPERATIVE TRANSESOPHAGEAL ECHOCARDIOGRAM;  Surgeon: Gaye Pollack, MD;  Location: [facility] OR;  Service: Open Heart Surgery;  Laterality: N/A;  . Coronary artery bypass graft N/A 07/04/2013    Procedure: CORONARY ARTERY BYPASS GRAFTING (CABG) times four using left internal mammary artery and right saphenous leg vein.;  Surgeon: Gaye Pollack, MD;  Location: [facility] OR;  Service: Open Heart Surgery;  Laterality: N/A;  . Left heart catheterization with coronary angiogram N/A 06/30/2013    Procedure: LEFT HEART CATHETERIZATION WITH CORONARY ANGIOGRAM;  Surgeon: Blane Ohara, MD;  Location: [facility] CATH LAB;  Service: Cardiovascular;  Laterality: N/A;    Family History  Problem Relation Age of Onset  . Dementia Mother   . Cancer Mother     breast  . Diabetes Father   . Diabetes Paternal Grandmother   . Hypertension Paternal Grandmother     Allergies  Allergen Reactions  . Ampicillin     rash  . Atorvastatin Other (See Comments)    Severe muscle cramping  . Codeine Other (See Comments)    dizziness  . Elavil [Amitriptyline] Other (See Comments)  Tongue swelling  . Hctz [Hydrochlorothiazide] Other (See Comments)    Severe leg cramps  . Hytrin [Terazosin] Other (See Comments)    syncope  . Wasp Venom Other (See Comments)    Hand swelling  . Erythromycin Rash    Current Outpatient Prescriptions on File Prior to Visit  Medication Sig Dispense Refill  . acetaminophen (TYLENOL) 325 MG tablet Take 2 tablets (650 mg total) by mouth every 6 (six) hours as needed for mild pain.    [name] amLODipine (NORVASC) 10 MG tablet Take 1 tablet (10 mg total) by mouth daily. 30 tablet 8  . aspirin EC 81 MG tablet Take 81 mg by mouth daily.    [name] EPINEPHrine 0.3 mg/0.3 mL IJ SOAJ injection Inject 0.3 mLs (0.3 mg total) into the muscle once. As needed for tongue/lip swelling/shortness of breath with bee/wasp sting 2 Device 1  . estradiol (ESTRACE) 0.1 MG/GM vaginal cream Apply 1 gram per  vagina every night for 2 weeks, then apply three times a week 30 g 12  . fluticasone (FLONASE) 50 MCG/ACT nasal spray Place 2 sprays into both nostrils daily. 16 g 6  . levocetirizine (XYZAL) 5 MG tablet Take 1 tablet (5 mg total) by mouth every evening. 30 tablet 5  . losartan (COZAAR) 100 MG tablet take 1 tablet by mouth once daily 30 tablet 5  . rosuvastatin (CRESTOR) 10 MG tablet Take 1 tablet (10 mg total) by mouth daily. 90 tablet 3  . SYNTHROID 100 MCG tablet take 1 tablet by mouth every morning 30 tablet 5   No current facility-administered medications on file prior to visit.    BP 112/70 mmHg  Pulse 61  Resp 20  Ht 5\' 6"  (1.676 m)  Wt 160 lb 12.8 oz (72.938 kg)  BMI 25.97 kg/m2  SpO2 98%       Objective:   Physical Exam  Constitutional: She is oriented to person, place, and time. She appears well-developed and well-nourished.  HENT:  Head: Normocephalic and atraumatic.  Cardiovascular: Normal rate, regular rhythm and normal heart sounds.   No murmur heard. Pulmonary/Chest: Effort normal and breath sounds normal. No respiratory distress. She has no wheezes.  Neurological: She is alert and oriented to person, place, and time.  Skin: Skin is warm and dry.  Psychiatric: She has a normal mood and affect. Her behavior is normal. Judgment and thought content normal.          Assessment & Plan:  Atrophic Vaginitis- advised pt to follow back up with GYN. I think this is most likely cause for her vaginal burning.   Depression- overall seems stable.  Advised pt to let me know if she feels that her symptoms worsen or if she wishes to retry medication.  Fatigue-  Already on synthroid, no real value in checking Free T3, Free T4, repeat follow up TSH.

## 2015-05-18 NOTE — Progress Notes (Signed)
Pre visit review using our clinic review tool, if applicable. No additional management support is needed unless otherwise documented below in the visit note. 

## 2015-05-19 ENCOUNTER — Telehealth: Payer: Self-pay | Admitting: Family

## 2015-05-19 ENCOUNTER — Encounter: Payer: Self-pay | Admitting: Family

## 2015-05-19 DIAGNOSIS — E039 Hypothyroidism, unspecified: Secondary | ICD-10-CM

## 2015-05-19 DIAGNOSIS — E559 Vitamin D deficiency, unspecified: Secondary | ICD-10-CM

## 2015-05-19 MED ORDER — VITAMIN D (ERGOCALCIFEROL) 1.25 MG (50000 UNIT) PO CAPS
50000.0000 [IU] | ORAL_CAPSULE | ORAL | Status: DC
Start: 2015-05-19 — End: 2015-10-27

## 2015-05-19 MED ORDER — CIPROFLOXACIN HCL 250 MG PO TABS: 250.0000 mg | ORAL_TABLET | Freq: Two times a day (BID) | ORAL | Status: DC

## 2015-05-19 MED ORDER — SYNTHROID 100 MCG PO TABS: ORAL_TABLET | ORAL | Status: DC

## 2015-05-19 NOTE — Telephone Encounter (Signed)
Vitamin D level is low.  Advise patient to begin vit D 50000 units once weekly for 12 weeks, then repeat vit D level (dx Vit D deficiency).    Urine shows UTI.  Start cipro.  Change synthroid to one tab daily 6 days a week, 1/2 tab one day a week. Repeat TSH in 6 weeks dx hypothyroid.

## 2015-05-20 LAB — URINE CULTURE: Colony Count: >=100000

## 2015-05-20 NOTE — Telephone Encounter (Signed)
Spoke with pt and she voices understanding. Pt will take the medications as changed per pcp.

## 2015-05-21 ENCOUNTER — Telehealth: Payer: Self-pay | Admitting: *Deleted

## 2015-05-21 MED ORDER — LOSARTAN POTASSIUM 100 MG PO TABS: 100.0000 mg | ORAL_TABLET | Freq: Every day | ORAL | Status: DC

## 2015-05-21 NOTE — Telephone Encounter (Signed)
Received fax from [facility] for Losartan 100mg , 90 day supply.  Refills sent.

## 2015-05-31 ENCOUNTER — Encounter: Payer: Self-pay | Admitting: Family

## 2015-05-31 ENCOUNTER — Ambulatory Visit (INDEPENDENT_AMBULATORY_CARE_PROVIDER_SITE_OTHER): Payer: Medicare Other | Admitting: Family

## 2015-05-31 VITALS — BP 120/78 | HR 58 | Temp 97.8°F | Resp 16 | Ht 66.0 in | Wt 162.6 lb

## 2015-05-31 DIAGNOSIS — H6123 Impacted cerumen, bilateral: Secondary | ICD-10-CM | POA: Diagnosis not present

## 2015-05-31 DIAGNOSIS — I10 Essential (primary) hypertension: Secondary | ICD-10-CM | POA: Diagnosis not present

## 2015-05-31 DIAGNOSIS — R3129 Other microscopic hematuria: Secondary | ICD-10-CM

## 2015-05-31 LAB — POC URINALSYSI DIPSTICK (AUTOMATED)
Blood, UA: NEGATIVE
Glucose, UA: NEGATIVE
NITRITE UA: NEGATIVE
PH UA: 5.5
Spec Grav, UA: >=1.03
Urobilinogen, UA: NEGATIVE

## 2015-05-31 NOTE — Progress Notes (Signed)
Pre visit review using our clinic review tool, if applicable. No additional management support is needed unless otherwise documented below in the visit note. 

## 2015-05-31 NOTE — Patient Instructions (Addendum)
Please contact the lipid clinic to arrange follow up appointment.  Purchase over the counter ear wax softening kit such as Debrox to help prevent wax build up. Follow up in 1 month.

## 2015-05-31 NOTE — Progress Notes (Signed)
Subjective:    Patient ID: Amanda Curtis, female    DOB: 06-01-1935, 80 y.o.   MRN: [id number]  HPI  Amanda Curtis is a 80 yr old female who presents today with several concerns:  1) Hearing problem- reports decreased hearing in both ears.   2) HTN- reports that she continues to feel weak with intermittent dizziness.   BP Readings from Last 3 Encounters:  05/31/15 120/78  05/18/15 112/70  05/07/15 130/76   3) Hematuria- Reports that the insurance nurse told her that her urine had protein and blood.      Review of Systems    see HPI  Past Medical History  Diagnosis Date  . Hypertension   . Arthritis   . Graves disease 2000    s/p RIA  . Diabetes mellitus without complication ([place])   . Insomnia   . Vitamin D deficiency     Social History   Social History  . Marital Status: Widowed    Spouse Name: N/A  . Number of Children: N/A  . Years of Education: N/A   Occupational History  . Not on file.   Social History Main Topics  . Smoking status: Never Smoker   . Smokeless tobacco: Never Used  . Alcohol Use: Yes     Comment: 1 glass wine every 6 months  . Drug Use: No  . Sexual Activity: Not on file   Other Topics Concern  . Not on file   Social History Narrative   Widow   Retired worked for [employer] and did a lot of travelling for [employer].   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   She has 1 son- lives in [place]   3 grandsons- college age (one is in [place])             Past Surgical History  Procedure Laterality Date  . Appendectomy    . Nasal septum surgery      childhood  . Intraoperative transesophageal echocardiogram N/A 07/04/2013    Procedure: INTRAOPERATIVE TRANSESOPHAGEAL ECHOCARDIOGRAM;  Surgeon: Gaye Pollack, MD;  Location: [facility] OR;  Service: Open Heart Surgery;  Laterality: N/A;  . Coronary artery bypass graft N/A 07/04/2013    Procedure: CORONARY ARTERY  BYPASS GRAFTING (CABG) times four using left internal mammary artery and right saphenous leg vein.;  Surgeon: Gaye Pollack, MD;  Location: [facility] OR;  Service: Open Heart Surgery;  Laterality: N/A;  . Left heart catheterization with coronary angiogram N/A 06/30/2013    Procedure: LEFT HEART CATHETERIZATION WITH CORONARY ANGIOGRAM;  Surgeon: Blane Ohara, MD;  Location: [facility] CATH LAB;  Service: Cardiovascular;  Laterality: N/A;    Family History  Problem Relation Age of Onset  . Dementia Mother   . Cancer Mother     breast  . Diabetes Father   . Diabetes Paternal Grandmother   . Hypertension Paternal Grandmother     Allergies  Allergen Reactions  . Ampicillin     rash  . Atorvastatin Other (See Comments)    Severe muscle cramping  . Codeine Other (See Comments)    dizziness  . Elavil [Amitriptyline] Other (See Comments)    Tongue swelling  . Hctz [Hydrochlorothiazide] Other (See Comments)    Severe leg cramps  . Hytrin [Terazosin] Other (See Comments)    syncope  . Wasp Venom Other (See Comments)    Hand swelling  . Erythromycin Rash  Current Outpatient Prescriptions on File Prior to Visit  Medication Sig Dispense Refill  . acetaminophen (TYLENOL) 325 MG tablet Take 2 tablets (650 mg total) by mouth every 6 (six) hours as needed for mild pain.    [name] amLODipine (NORVASC) 10 MG tablet Take 1 tablet (10 mg total) by mouth daily. 30 tablet 8  . aspirin EC 81 MG tablet Take 81 mg by mouth daily.    [name] EPINEPHrine 0.3 mg/0.3 mL IJ SOAJ injection Inject 0.3 mLs (0.3 mg total) into the muscle once. As needed for tongue/lip swelling/shortness of breath with bee/wasp sting 2 Device 1  . estradiol (ESTRACE) 0.1 MG/GM vaginal cream Apply 1 gram per vagina every night for 2 weeks, then apply three times a week 30 g 12  . fluticasone (FLONASE) 50 MCG/ACT nasal spray Place 2 sprays into both nostrils daily. 16 g 6  . levocetirizine (XYZAL) 5 MG tablet Take 1 tablet (5 mg total) by mouth every  evening. 30 tablet 5  . losartan (COZAAR) 100 MG tablet Take 1 tablet (100 mg total) by mouth daily. 90 tablet 1  . rosuvastatin (CRESTOR) 10 MG tablet Take 1 tablet (10 mg total) by mouth daily. 90 tablet 3  . SYNTHROID 100 MCG tablet 1 tab by mouth daily 6 days a week, 1/2 tab by mouth one day a week 30 tablet 5  . Vitamin D, Ergocalciferol, (DRISDOL) 50000 units CAPS capsule Take 1 capsule (50,000 Units total) by mouth every 7 (seven) days. 12 capsule 0   No current facility-administered medications on file prior to visit.    BP 120/78 mmHg  Pulse 58  Temp(Src) 97.8 F (36.6 C) (Oral)  Resp 16  Ht 5\' 6"  (1.676 m)  Wt 162 lb 9.6 oz (73.755 kg)  BMI 26.26 kg/m2  SpO2 98%    Objective:   Physical Exam  Constitutional: She appears well-developed and well-nourished.  HENT:  Bilateral cerumen impaction  Cardiovascular: Normal rate, regular rhythm and normal heart sounds.   No murmur heard. Pulmonary/Chest: Effort normal and breath sounds normal. No respiratory distress. She has no wheezes.  Psychiatric: She has a normal mood and affect. Her behavior is normal. Judgment and thought content normal.          Assessment & Plan:  Hx microscopic hematuria- No hematuria today on dip. Note is made of trace protein.  This is likely related to hx of DM and HTN.    Cerumen impaction- Ceruminosis is noted.  Wax is removed by syringing and manual debridement. Instructions for home care to prevent wax buildup are given.  HTN- BP stable on current meds.  Continue same.

## 2015-06-01 LAB — URINE CULTURE: Colony Count: 40000

## 2015-06-04 ENCOUNTER — Ambulatory Visit: Payer: Medicare Other | Admitting: Cardiovascular Disease

## 2015-06-09 ENCOUNTER — Encounter: Payer: Self-pay | Admitting: Family Medicine

## 2015-06-09 ENCOUNTER — Ambulatory Visit (INDEPENDENT_AMBULATORY_CARE_PROVIDER_SITE_OTHER): Payer: Medicare Other | Admitting: Family Medicine

## 2015-06-09 VITALS — BP 120/70 | HR 84 | Temp 98.1°F | Resp 18 | Ht 66.0 in | Wt 160.2 lb

## 2015-06-09 DIAGNOSIS — F341 Dysthymic disorder: Secondary | ICD-10-CM

## 2015-06-09 DIAGNOSIS — N952 Postmenopausal atrophic vaginitis: Secondary | ICD-10-CM | POA: Diagnosis not present

## 2015-06-09 DIAGNOSIS — E1151 Type 2 diabetes mellitus with diabetic peripheral angiopathy without gangrene: Secondary | ICD-10-CM | POA: Diagnosis not present

## 2015-06-09 DIAGNOSIS — R350 Frequency of micturition: Secondary | ICD-10-CM | POA: Diagnosis not present

## 2015-06-09 LAB — GLUCOSE, POCT (MANUAL RESULT ENTRY): POC Glucose: 168 mg/dl — AB (ref 70–99)

## 2015-06-09 NOTE — Patient Instructions (Signed)
I will send your urine for culture and will let you know what it shows.  However I suspect that your symptoms are due to "atrophy" of the vulva and vagina. This means that the tissue is dry and tender, and can cause pain. Estrogen therapy can be helpful here.  Please see Dr. Hulan Fray again to see what you might be able to try instead of the cream.  A vaginal tablet or vaginal ring might be helpful for you.
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
## 2015-06-09 NOTE — Progress Notes (Signed)
Pre visit review using our clinic review tool, if applicable. No additional management support is needed unless otherwise documented below in the visit note. 

## 2015-06-10 ENCOUNTER — Other Ambulatory Visit: Payer: Medicare Other

## 2015-06-10 NOTE — Addendum Note (Signed)
Addended by: Caffie Pinto on: 06/10/2015 10:22 AM   Modules accepted: Orders

## 2015-06-12 LAB — URINE CULTURE: Colony Count: 40000

## 2015-06-14 ENCOUNTER — Encounter: Payer: Self-pay | Admitting: Family Medicine

## 2015-06-18 ENCOUNTER — Telehealth: Payer: Self-pay | Admitting: Family

## 2015-06-18 NOTE — Telephone Encounter (Signed)
Caller name: Self  Can be reached: [phone]    Reason for call: Patient request results of UA

## 2015-06-18 NOTE — Telephone Encounter (Signed)
Left detailed message on cell# per 06/14/15 lab letter and to call if any questions.

## 2015-08-30 ENCOUNTER — Ambulatory Visit (INDEPENDENT_AMBULATORY_CARE_PROVIDER_SITE_OTHER): Payer: Medicare Other | Admitting: Cardiovascular Disease

## 2015-08-30 ENCOUNTER — Encounter: Payer: Self-pay | Admitting: Cardiovascular Disease

## 2015-08-30 VITALS — BP 160/90 | HR 62 | Ht 66.0 in | Wt 159.8 lb

## 2015-08-30 DIAGNOSIS — I25119 Atherosclerotic heart disease of native coronary artery with unspecified angina pectoris: Secondary | ICD-10-CM

## 2015-08-30 DIAGNOSIS — E785 Hyperlipidemia, unspecified: Secondary | ICD-10-CM

## 2015-08-30 DIAGNOSIS — I10 Essential (primary) hypertension: Secondary | ICD-10-CM | POA: Diagnosis not present

## 2015-08-30 MED ORDER — CHLORTHALIDONE 25 MG PO TABS: 25.0000 mg | ORAL_TABLET | Freq: Every day | ORAL | Status: DC

## 2015-08-30 NOTE — Patient Instructions (Signed)
Medication Instructions:  Your physician has recommended you make the following change in your medication:  1. START Chlorthalidone 25mg  take one tablet by mouth daily  Labwork: Your physician recommends that you return for lab work in: 2 WEEKS (BMP)  Testing/Procedures: No new orders.   Follow-Up: Your physician wants you to follow-up in: 6 MONTHS with Dr Burt Knack.  You will receive a reminder letter in the mail two months in advance. If you don't receive a letter, please call our office to schedule the follow-up appointment.   Any Other Special Instructions Will Be Listed Below (If Applicable).     If you need a refill on your cardiac medications before your next appointment, please call your pharmacy.

## 2015-08-30 NOTE — Progress Notes (Signed)
Cardiology Office Note Date:  08/30/2015   ID:  Amanda Curtis, DOB 06-12-1935, MRN [id number]  PCP:  Nance Pear., NP  Cardiologist:  Sherren Mocha, MD    Chief Complaint  Patient presents with  . Coronary Artery Disease   History of Present Illness: Amanda Curtis is a 80 y.o. female who presents for follow-up evaluation. She had CABG in May 2015. Other problems include hypertension, hyperlipidemia, and bilateral carotid stenosis. A Myoview scan was performed in February 2016 to evaluate recurrence of chest pain. This study was abnormal without a clear impression of significant ischemia. After review of symptoms with the patient, ongoing medical therapy was recommended.   She's been having a lot of dental problems, reports having an abscess requiring dental work and prolonged treatment with clindamycin.   She cut back on losartan on her own because of dizziness. Symptoms are improved on the lower dose of losartan. Blood pressure continues to run high. The patient has a lot of medication side effects. She cannot take statin drugs. She's been seen in the lipid clinic twice but hasn't pursued any alternative lipid-lowering therapies. She plans on starting on a new vitamin.  She reports no change in the pattern of chest pain. She is not having exertional chest discomfort on a regular basis. No shortness of breath. She complains of leg edema and attributes this to amlodipine. No orthopnea or PND.   Past Medical History  Diagnosis Date  . Hypertension   . Arthritis   . Graves disease 2000    s/p RIA  . Diabetes mellitus without complication ([place])   . Insomnia   . Vitamin D deficiency     Past Surgical History  Procedure Laterality Date  . Appendectomy    . Nasal septum surgery      childhood  . Intraoperative transesophageal echocardiogram N/A 07/04/2013    Procedure: INTRAOPERATIVE TRANSESOPHAGEAL ECHOCARDIOGRAM;  Surgeon: Gaye Pollack, MD;  Location: [facility] OR;  Service:  Open Heart Surgery;  Laterality: N/A;  . Coronary artery bypass graft N/A 07/04/2013    Procedure: CORONARY ARTERY BYPASS GRAFTING (CABG) times four using left internal mammary artery and right saphenous leg vein.;  Surgeon: Gaye Pollack, MD;  Location: [facility] OR;  Service: Open Heart Surgery;  Laterality: N/A;  . Left heart catheterization with coronary angiogram N/A 06/30/2013    Procedure: LEFT HEART CATHETERIZATION WITH CORONARY ANGIOGRAM;  Surgeon: Blane Ohara, MD;  Location: [facility] CATH LAB;  Service: Cardiovascular;  Laterality: N/A;    Current Outpatient Prescriptions  Medication Sig Dispense Refill  . acetaminophen (TYLENOL) 325 MG tablet Take 2 tablets (650 mg total) by mouth every 6 (six) hours as needed for mild pain.    [name] amLODipine (NORVASC) 10 MG tablet Take 1 tablet (10 mg total) by mouth daily. 30 tablet 8  . aspirin EC 81 MG tablet Take 81 mg by mouth daily.    [name] EPINEPHrine 0.3 mg/0.3 mL IJ SOAJ injection Inject 0.3 mLs (0.3 mg total) into the muscle once. As needed for tongue/lip swelling/shortness of breath with bee/wasp sting 2 Device 1  . fluticasone (FLONASE) 50 MCG/ACT nasal spray Place 2 sprays into both nostrils daily. 16 g 6  . levocetirizine (XYZAL) 5 MG tablet Take 1 tablet (5 mg total) by mouth every evening. 30 tablet 5  . levothyroxine (SYNTHROID, LEVOTHROID) 100 MCG tablet Take 100 mcg by mouth daily before breakfast.    . losartan (COZAAR) 100 MG tablet Take 50 mg by mouth  daily.    . Vitamin D, Ergocalciferol, (DRISDOL) 50000 units CAPS capsule Take 1 capsule (50,000 Units total) by mouth every 7 (seven) days. 12 capsule 0   No current facility-administered medications for this visit.    Allergies:   Ampicillin; Atorvastatin; Codeine; Elavil; Hctz; Hytrin; Wasp venom; and Erythromycin   Social History:  The patient  reports that she has never smoked. She has never used smokeless tobacco. She reports that she drinks alcohol. She reports that she does not use  illicit drugs.   Family History:  The patient's  family history includes Cancer in her mother; Dementia in her mother; Diabetes in her father and paternal grandmother; Hypertension in her paternal grandmother.   ROS:  Please see the history of present illness.  Otherwise, review of systems is positive for leg pain and weakness, dizziness.  All other systems are reviewed and negative.   PHYSICAL EXAM: VS:  BP 160/90 mmHg  Pulse 62  Ht 5\' 6"  (1.676 m)  Wt 159 lb 12.8 oz (72.485 kg)  BMI 25.80 kg/m2 , BMI Body mass index is 25.8 kg/(m^2). GEN: Well nourished, well developed, in no acute distress HEENT: normal Neck: no JVD, no masses. Bilateral carotid bruits Cardiac: RRR with soft systolic murmur at the LSB              Respiratory:  clear to auscultation bilaterally, normal work of breathing GI: soft, nontender, nondistended, + BS MS: no deformity or atrophy Ext: trace bilateral pretibial edema Skin: warm and dry, no rash Neuro:  Strength and sensation are intact Psych: euthymic mood, full affect  EKG:  EKG is ordered today. The ekg ordered today shows NSR 62 bpm, age-indeterminate septal infarct, age-indeterminate inferior infarct  Recent Labs: 03/15/2015: ALT 10 05/18/2015: BUN 28*; Creatinine, Ser 1.09; Hemoglobin 12.5; Platelets 476.0*; Potassium 4.3; Sodium 137; TSH 0.68   Lipid Panel     Component Value Date/Time   CHOL 280* 03/15/2015 0908   TRIG 234* 03/15/2015 0908   HDL 42* 03/15/2015 0908   CHOLHDL 6.7* 03/15/2015 0908   VLDL 47* 03/15/2015 0908   LDLCALC 191* 03/15/2015 0908   LDLDIRECT 227.0 11/30/2014 1534      Wt Readings from Last 3 Encounters:  08/30/15 159 lb 12.8 oz (72.485 kg)  06/09/15 160 lb 3.2 oz (72.666 kg)  05/31/15 162 lb 9.6 oz (73.755 kg)     Cardiac Studies Reviewed: Echo 07-04-2013: Study Conclusions  - Left ventricle: The cavity size was normal. Wall thickness was normal. Systolic function was normal. Wall motion was normal; there  were no regional wall motion abnormalities. - Aortic valve: Trivial regurgitation directed centrally in the LVOT. Valve area: 1.56cm^2(VTI). Valve area: 1.6cm^2 (Vmax). - Aorta: The aorta was not dilated, moderately calcified, and without evidence of coarctation. - Mitral valve: No evidence of vegetation. Mild regurgitation. - Left atrium: No evidence of thrombus in the atrial cavity or appendage. - Atrial septum: No defect or patent foramen ovale was identified. - Tricuspid valve: No evidence of vegetation.  ASSESSMENT AND PLAN: 1.  CAD, native vessel: with atypical angina. Pt is post-CABG. Fortunately her symptoms have not progressed at all over the past year. Current medications are reviewed and will be continued without change.   2. Hyperlipidemia: statin-intolerant. She will start on a vitamin that apparently lowers cholesterol. I have referred her to the lipid clinic twice in the past. She's not interested in any prescription medicines for her lipids. Lifestyle modification reviewed.  3. HTN, uncontrolled: add chlorthalidone  25 mg daily. Continue other medications. BMET in 2 weeks. Counseled to drink more fluids.  Current medicines are reviewed with the patient today.  The patient does not have concerns regarding medicines.  Labs/ tests ordered today include:  No orders of the defined types were placed in this encounter.    Disposition:   FU 6 months  Signed, Sherren Mocha, MD  08/30/2015 10:06 AM    [facility] [address], [place], [place]  [address] Phone: [phone]; Fax: [phone]

## 2015-09-01 NOTE — Addendum Note (Signed)
Addended by: Freada Bergeron on: 09/01/2015 05:21 PM   Modules accepted: Orders

## 2015-09-03 ENCOUNTER — Encounter: Payer: Self-pay | Admitting: Family

## 2015-09-03 ENCOUNTER — Ambulatory Visit (INDEPENDENT_AMBULATORY_CARE_PROVIDER_SITE_OTHER): Payer: Medicare Other | Admitting: Family

## 2015-09-03 VITALS — BP 138/80 | HR 64 | Temp 97.6°F | Resp 18 | Ht 66.0 in | Wt 159.4 lb

## 2015-09-03 DIAGNOSIS — J019 Acute sinusitis, unspecified: Secondary | ICD-10-CM | POA: Diagnosis not present

## 2015-09-03 MED ORDER — CEFUROXIME AXETIL 500 MG PO TABS: 500.0000 mg | ORAL_TABLET | Freq: Two times a day (BID) | ORAL | Status: DC

## 2015-09-03 MED ORDER — BENZONATATE 100 MG PO CAPS: 100.0000 mg | ORAL_CAPSULE | Freq: Three times a day (TID) | ORAL | Status: DC | PRN

## 2015-09-03 NOTE — Progress Notes (Signed)
Pre visit review using our clinic review tool, if applicable. No additional management support is needed unless otherwise documented below in the visit note. 

## 2015-09-03 NOTE — Progress Notes (Signed)
Subjective:    Patient ID: Amanda Curtis, female    DOB: September 08, 1935, 80 y.o.   MRN: [id number]  HPI  Amanda Curtis is a 80 yr old female who presents today with chief complaint of nasal congestion.  Has been present x 1 week. Reports associated dry cough which is worse at night.  Using Guaifenesin AC for sinus pressure and pain relief. She denies associated fever. Reports low energy level.   Review of Systems See HPI  Past Medical History  Diagnosis Date  . Hypertension   . Arthritis   . Graves disease 2000    s/p RIA  . Diabetes mellitus without complication ([place])   . Insomnia   . Vitamin D deficiency      Social History   Social History  . Marital Status: Widowed    Spouse Name: N/A  . Number of Children: N/A  . Years of Education: N/A   Occupational History  . Not on file.   Social History Main Topics  . Smoking status: Never Smoker   . Smokeless tobacco: Never Used  . Alcohol Use: Yes     Comment: 1 glass wine every 6 months  . Drug Use: No  . Sexual Activity: Not on file   Other Topics Concern  . Not on file   Social History Narrative   Widow   Retired worked for [employer] and did a lot of travelling for [employer].   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   She has 1 son- lives in [place]   3 grandsons- college age (one is in [place])             Past Surgical History  Procedure Laterality Date  . Appendectomy    . Nasal septum surgery      childhood  . Intraoperative transesophageal echocardiogram N/A 07/04/2013    Procedure: INTRAOPERATIVE TRANSESOPHAGEAL ECHOCARDIOGRAM;  Surgeon: Gaye Pollack, MD;  Location: [facility] OR;  Service: Open Heart Surgery;  Laterality: N/A;  . Coronary artery bypass graft N/A 07/04/2013    Procedure: CORONARY ARTERY BYPASS GRAFTING (CABG) times four using left internal mammary artery and right saphenous leg vein.;  Surgeon: Gaye Pollack, MD;   Location: [facility] OR;  Service: Open Heart Surgery;  Laterality: N/A;  . Left heart catheterization with coronary angiogram N/A 06/30/2013    Procedure: LEFT HEART CATHETERIZATION WITH CORONARY ANGIOGRAM;  Surgeon: Blane Ohara, MD;  Location: [facility] CATH LAB;  Service: Cardiovascular;  Laterality: N/A;    Family History  Problem Relation Age of Onset  . Dementia Mother   . Cancer Mother     breast  . Diabetes Father   . Diabetes Paternal Grandmother   . Hypertension Paternal Grandmother     Allergies  Allergen Reactions  . Ampicillin     rash  . Atorvastatin Other (See Comments)    Severe muscle cramping  . Codeine Other (See Comments)    dizziness  . Elavil [Amitriptyline] Other (See Comments)    Tongue swelling  . Hctz [Hydrochlorothiazide] Other (See Comments)    Severe leg cramps  . Hytrin [Terazosin] Other (See Comments)    syncope  . Wasp Venom Other (See Comments)    Hand swelling  . Erythromycin Rash    Current Outpatient Prescriptions on File Prior to Visit  Medication Sig Dispense Refill  . acetaminophen (TYLENOL) 325 MG tablet Take 2 tablets (650  mg total) by mouth every 6 (six) hours as needed for mild pain.    [name] amLODipine (NORVASC) 10 MG tablet Take 1 tablet (10 mg total) by mouth daily. 30 tablet 8  . aspirin EC 81 MG tablet Take 81 mg by mouth daily.    . chlorthalidone (HYGROTON) 25 MG tablet Take 1 tablet (25 mg total) by mouth daily. 90 tablet 3  . EPINEPHrine 0.3 mg/0.3 mL IJ SOAJ injection Inject 0.3 mLs (0.3 mg total) into the muscle once. As needed for tongue/lip swelling/shortness of breath with bee/wasp sting 2 Device 1  . fluticasone (FLONASE) 50 MCG/ACT nasal spray Place 2 sprays into both nostrils daily. 16 g 6  . levocetirizine (XYZAL) 5 MG tablet Take 1 tablet (5 mg total) by mouth every evening. 30 tablet 5  . levothyroxine (SYNTHROID, LEVOTHROID) 100 MCG tablet Take 100 mcg by mouth daily before breakfast.    . losartan (COZAAR) 100 MG tablet  Take 50 mg by mouth daily.    . Vitamin D, Ergocalciferol, (DRISDOL) 50000 units CAPS capsule Take 1 capsule (50,000 Units total) by mouth every 7 (seven) days. 12 capsule 0   No current facility-administered medications on file prior to visit.    BP 138/80 mmHg  Pulse 64  Temp(Src) 97.6 F (36.4 C) (Oral)  Resp 18  Ht 5\' 6"  (1.676 m)  Wt 159 lb 6.4 oz (72.303 kg)  BMI 25.74 kg/m2  SpO2 96%       Objective:   Physical Exam  Constitutional: She is oriented to person, place, and time. She appears well-developed and well-nourished.  HENT:  Right Ear: Tympanic membrane and ear canal normal.  Left Ear: Tympanic membrane and ear canal normal.  Nose: Right sinus exhibits maxillary sinus tenderness and frontal sinus tenderness. Left sinus exhibits maxillary sinus tenderness and frontal sinus tenderness.  Difficulty visualizing posterior oropharynx due to gagging with examination  Cardiovascular: Normal rate, regular rhythm and normal heart sounds.   No murmur heard. Pulmonary/Chest: Effort normal and breath sounds normal. No respiratory distress. She has no wheezes.  Musculoskeletal: She exhibits no edema.  Lymphadenopathy:    She has no cervical adenopathy.  Neurological: She is alert and oriented to person, place, and time.  Psychiatric: She has a normal mood and affect. Her behavior is normal. Judgment and thought content normal.          Assessment & Plan:  Sinusitis- will initiate ceftin.  Add tessalon prn cough. Pt is advised as follows:  Begin ceftin for sinus infection. You may use tessalon as needed for cough. Call if you develop fever, if new/worsening symptoms, or if symptoms are not improved in 3 days.    Debbrah Alar NP

## 2015-09-03 NOTE — Patient Instructions (Signed)
Begin ceftin for sinus infection. You may use tessalon as needed for cough. Call if you develop fever, if new/worsening symptoms, or if symptoms are not improved in 3 days.

## 2015-09-14 ENCOUNTER — Other Ambulatory Visit: Payer: Medicare Other

## 2015-09-15 ENCOUNTER — Other Ambulatory Visit: Payer: Self-pay | Admitting: Family

## 2015-09-16 ENCOUNTER — Other Ambulatory Visit (INDEPENDENT_AMBULATORY_CARE_PROVIDER_SITE_OTHER): Payer: Medicare Other | Admitting: *Deleted

## 2015-09-16 DIAGNOSIS — I10 Essential (primary) hypertension: Secondary | ICD-10-CM

## 2015-09-16 DIAGNOSIS — E785 Hyperlipidemia, unspecified: Secondary | ICD-10-CM

## 2015-09-16 LAB — BASIC METABOLIC PANEL
BUN: 29 mg/dL — ABNORMAL HIGH (ref 7–25)
CALCIUM: 9.4 mg/dL (ref 8.6–10.4)
CO2: 25 mmol/L (ref 20–31)
Chloride: 100 mmol/L (ref 98–110)
Creat: 1.4 mg/dL — ABNORMAL HIGH (ref 0.60–0.93)
Glucose, Bld: 85 mg/dL (ref 65–99)
POTASSIUM: 4.7 mmol/L (ref 3.5–5.3)
SODIUM: 135 mmol/L (ref 135–146)

## 2015-09-17 ENCOUNTER — Other Ambulatory Visit: Payer: Self-pay

## 2015-09-17 DIAGNOSIS — I10 Essential (primary) hypertension: Secondary | ICD-10-CM

## 2015-10-14 ENCOUNTER — Other Ambulatory Visit: Payer: Medicare Other | Admitting: *Deleted

## 2015-10-14 DIAGNOSIS — I10 Essential (primary) hypertension: Secondary | ICD-10-CM

## 2015-10-14 LAB — BASIC METABOLIC PANEL
BUN: 26 mg/dL — ABNORMAL HIGH (ref 7–25)
CALCIUM: 9.7 mg/dL (ref 8.6–10.4)
CO2: 22 mmol/L (ref 20–31)
CREATININE: 1.39 mg/dL — AB (ref 0.60–0.93)
Chloride: 102 mmol/L (ref 98–110)
GLUCOSE: 112 mg/dL — AB (ref 65–99)
Potassium: 4.7 mmol/L (ref 3.5–5.3)
Sodium: 137 mmol/L (ref 135–146)

## 2015-10-27 ENCOUNTER — Ambulatory Visit (INDEPENDENT_AMBULATORY_CARE_PROVIDER_SITE_OTHER): Payer: Medicare Other | Admitting: Family

## 2015-10-27 ENCOUNTER — Encounter: Payer: Self-pay | Admitting: Family

## 2015-10-27 ENCOUNTER — Telehealth: Payer: Self-pay | Admitting: Family

## 2015-10-27 VITALS — BP 130/92 | HR 61 | Temp 98.2°F | Resp 18 | Ht 62.0 in | Wt 158.4 lb

## 2015-10-27 DIAGNOSIS — E559 Vitamin D deficiency, unspecified: Secondary | ICD-10-CM | POA: Diagnosis not present

## 2015-10-27 DIAGNOSIS — R1011 Right upper quadrant pain: Secondary | ICD-10-CM

## 2015-10-27 DIAGNOSIS — E039 Hypothyroidism, unspecified: Secondary | ICD-10-CM | POA: Diagnosis not present

## 2015-10-27 DIAGNOSIS — E118 Type 2 diabetes mellitus with unspecified complications: Secondary | ICD-10-CM | POA: Diagnosis not present

## 2015-10-27 DIAGNOSIS — I10 Essential (primary) hypertension: Secondary | ICD-10-CM

## 2015-10-27 LAB — HEPATIC FUNCTION PANEL
ALBUMIN: 4.4 g/dL (ref 3.5–5.2)
ALK PHOS: 87 U/L (ref 39–117)
ALT: 10 U/L (ref 0–35)
AST: 15 U/L (ref 0–37)
Bilirubin, Direct: 0 mg/dL (ref 0.0–0.3)
TOTAL PROTEIN: 8.3 g/dL (ref 6.0–8.3)
Total Bilirubin: 0.5 mg/dL (ref 0.2–1.2)

## 2015-10-27 LAB — BASIC METABOLIC PANEL
BUN: 35 mg/dL — AB (ref 6–23)
CALCIUM: 9.5 mg/dL (ref 8.4–10.5)
CO2: 28 mEq/L (ref 19–32)
Chloride: 99 mEq/L (ref 96–112)
Creatinine, Ser: 1.39 mg/dL — ABNORMAL HIGH (ref 0.40–1.20)
GFR: 38.91 mL/min — AB (ref >60.00)
GLUCOSE: 116 mg/dL — AB (ref 70–99)
Potassium: 4.6 mEq/L (ref 3.5–5.1)
Sodium: 134 mEq/L — ABNORMAL LOW (ref 135–145)

## 2015-10-27 LAB — HEMOGLOBIN A1C: Hgb A1c MFr Bld: 6.9 % — ABNORMAL HIGH (ref 4.6–6.5)

## 2015-10-27 LAB — TSH: TSH: 2.38 u[IU]/mL (ref 0.35–4.50)

## 2015-10-27 LAB — VITAMIN D 25 HYDROXY (VIT D DEFICIENCY, FRACTURES): VITD: 20.29 ng/mL — ABNORMAL LOW (ref 30.00–100.00)

## 2015-10-27 NOTE — Assessment & Plan Note (Signed)
Stable on current medications, continue same.  

## 2015-10-27 NOTE — Telephone Encounter (Signed)
Sugar is stable/at goal.  Kidney function is stable.  Vitamin D level is low.  Advise patient to begin vit D 50000 units once weekly for 12 weeks, then repeat vit D level (dx Vit D deficiency).

## 2015-10-27 NOTE — Patient Instructions (Addendum)
Please complete lab work prior to leaving.  You will be contacted about scheduling your abdominal ultrasound.  Avoid juice due to high sugar content.

## 2015-10-27 NOTE — Assessment & Plan Note (Signed)
Follow up level is low. Advised pt to restart 12 week course of vit D 50000 units, see phone note.

## 2015-10-27 NOTE — Assessment & Plan Note (Signed)
Stable on current dose of synthroid, continue same.  

## 2015-10-27 NOTE — Progress Notes (Signed)
Subjective:    Patient ID: Amanda Curtis, female    DOB: 1935/12/03, 80 y.o.   MRN: [id number]  HPI   Amanda Curtis is a 80 yr old female who presents today for follow up.  HTN-  Dr. Burt Knack added chlorthalidone back in June. She is also taking 50mg  daily of losartan.  BP Readings from Last 3 Encounters:  10/27/15 (!) 130/92  09/03/15 138/80  08/30/15 (!) 160/90   Hypothyroid- on synthroid 180mcg. Lab Results  Component Value Date   TSH 0.68 05/18/2015   DM2- maintained on diet only. Reports that she is trying to watch her diet.  Does not check sugars at home. Does drink juice.   Lab Results  Component Value Date   HGBA1C 6.9 (H) 03/12/2015   HGBA1C 6.7 (H) 11/30/2014   HGBA1C 7.0 (H) 04/16/2014   Lab Results  Component Value Date   MICROALBUR 0.5 03/12/2015   LDLCALC 191 (H) 03/15/2015   CREATININE 1.39 (H) 10/14/2015   Vit D deficiency- Pt did not return for vit D level.  Completed 12 week course of weekly supplementation  In the spring, not currently taking vit D.    She reports some RUQ pain x 10 days. Pain radiates around to the back. + nausea   Review of Systems See HPI  Past Medical History:  Diagnosis Date  . Arthritis   . Diabetes mellitus without complication ([place])   . Graves disease 2000   s/p RIA  . Hypertension   . Insomnia   . Vitamin D deficiency      Social History   Social History  . Marital status: Widowed    Spouse name: N/A  . Number of children: N/A  . Years of education: N/A   Occupational History  . Not on file.   Social History Main Topics  . Smoking status: Never Smoker  . Smokeless tobacco: Never Used  . Alcohol use Yes     Comment: 1 glass wine every 6 months  . Drug use: No  . Sexual activity: Not on file   Other Topics Concern  . Not on file   Social History Narrative   Widow   Retired worked for [employer] and did a lot of travelling for [employer].   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   She has 1 son- lives in [place]   3 grandsons- college age (one is in [place])             Past Surgical History:  Procedure Laterality Date  . APPENDECTOMY    . CORONARY ARTERY BYPASS GRAFT N/A 07/04/2013   Procedure: CORONARY ARTERY BYPASS GRAFTING (CABG) times four using left internal mammary artery and right saphenous leg vein.;  Surgeon: Gaye Pollack, MD;  Location: [facility] OR;  Service: Open Heart Surgery;  Laterality: N/A;  . INTRAOPERATIVE TRANSESOPHAGEAL ECHOCARDIOGRAM N/A 07/04/2013   Procedure: INTRAOPERATIVE TRANSESOPHAGEAL ECHOCARDIOGRAM;  Surgeon: Gaye Pollack, MD;  Location: [facility] OR;  Service: Open Heart Surgery;  Laterality: N/A;  . LEFT HEART CATHETERIZATION WITH CORONARY ANGIOGRAM N/A 06/30/2013   Procedure: LEFT HEART CATHETERIZATION WITH CORONARY ANGIOGRAM;  Surgeon: Blane Ohara, MD;  Location: [facility] CATH LAB;  Service: Cardiovascular;  Laterality: N/A;  . NASAL SEPTUM SURGERY     childhood    Family History  Problem Relation Age of Onset  . Dementia Mother   . Cancer Mother  breast  . Diabetes Father   . Diabetes Paternal Grandmother   . Hypertension Paternal Grandmother     Allergies  Allergen Reactions  . Ampicillin     rash  . Atorvastatin Other (See Comments)    Severe muscle cramping  . Codeine Other (See Comments)    dizziness  . Elavil [Amitriptyline] Other (See Comments)    Tongue swelling  . Hctz [Hydrochlorothiazide] Other (See Comments)    Severe leg cramps  . Hytrin [Terazosin] Other (See Comments)    syncope  . Wasp Venom Other (See Comments)    Hand swelling  . Erythromycin Rash    Current Outpatient Prescriptions on File Prior to Visit  Medication Sig Dispense Refill  . acetaminophen (TYLENOL) 325 MG tablet Take 2 tablets (650 mg total) by mouth every 6 (six) hours as needed for mild pain.    [name] aspirin EC 81 MG tablet Take 81 mg by mouth daily.    . benzonatate  (TESSALON) 100 MG capsule Take 1 capsule (100 mg total) by mouth 3 (three) times daily as needed. 20 capsule 0  . cefUROXime (CEFTIN) 500 MG tablet Take 1 tablet (500 mg total) by mouth 2 (two) times daily with a meal. 20 tablet 0  . chlorthalidone (HYGROTON) 25 MG tablet Take 1 tablet (25 mg total) by mouth daily. 90 tablet 3  . EPINEPHrine 0.3 mg/0.3 mL IJ SOAJ injection Inject 0.3 mLs (0.3 mg total) into the muscle once. As needed for tongue/lip swelling/shortness of breath with bee/wasp sting 2 Device 1  . fluticasone (FLONASE) 50 MCG/ACT nasal spray Place 2 sprays into both nostrils daily. 16 g 6  . levocetirizine (XYZAL) 5 MG tablet Take 1 tablet (5 mg total) by mouth every evening. 30 tablet 5  . losartan (COZAAR) 100 MG tablet Take 50 mg by mouth daily.    [name] SYNTHROID 100 MCG tablet take 1 tablet by mouth every morning 30 tablet 0  . Vitamin D, Ergocalciferol, (DRISDOL) 50000 units CAPS capsule Take 1 capsule (50,000 Units total) by mouth every 7 (seven) days. 12 capsule 0  . amLODipine (NORVASC) 10 MG tablet Take 1 tablet (10 mg total) by mouth daily. (Patient not taking: Reported on 10/27/2015) 30 tablet 8   No current facility-administered medications on file prior to visit.     BP (!) 130/92 (BP Location: Right Arm, Patient Position: Sitting, Cuff Size: Normal)   Pulse 61   Temp 98.2 F (36.8 C) (Oral)   Resp 18   Ht 5\' 2"  (1.575 m)   Wt 158 lb 6.4 oz (71.8 kg)   SpO2 98%   BMI 28.97 kg/m       Objective:   Physical Exam  Constitutional: She is oriented to person, place, and time. She appears well-developed and well-nourished.  HENT:  Head: Normocephalic and atraumatic.  Cardiovascular: Normal rate, regular rhythm and normal heart sounds.   No murmur heard. Pulmonary/Chest: Effort normal and breath sounds normal. No respiratory distress. She has no wheezes.  Abdominal: Soft. Bowel sounds are normal. She exhibits no distension.  Mild RUQ tenderness without guarding    Musculoskeletal: She exhibits no edema.  Neurological: She is alert and oriented to person, place, and time.  Skin: Skin is warm and dry.  Psychiatric: She has a normal mood and affect. Her behavior is normal. Judgment and thought content normal.          Assessment & Plan:  Declines flu shot  RUQ pain- will obtain LFT and  send for abdominal US for further evaluation. Need to rule out cholecystitis.

## 2015-10-27 NOTE — Progress Notes (Signed)
Pre visit review using our clinic review tool, if applicable. No additional management support is needed unless otherwise documented below in the visit note. 

## 2015-10-28 ENCOUNTER — Ambulatory Visit (HOSPITAL_BASED_OUTPATIENT_CLINIC_OR_DEPARTMENT_OTHER)
Admission: RE | Admit: 2015-10-28 | Discharge: 2015-10-28 | Disposition: A | Discharge status: Home / Self Care | Payer: Medicare Other | Source: Ambulatory Visit | Attending: Family | Admitting: Family

## 2015-10-28 ENCOUNTER — Telehealth: Payer: Self-pay | Admitting: Family

## 2015-10-28 DIAGNOSIS — N281 Cyst of kidney, acquired: Secondary | ICD-10-CM | POA: Insufficient documentation

## 2015-10-28 DIAGNOSIS — R1011 Right upper quadrant pain: Secondary | ICD-10-CM | POA: Diagnosis present

## 2015-10-28 DIAGNOSIS — K760 Fatty (change of) liver, not elsewhere classified: Secondary | ICD-10-CM | POA: Diagnosis not present

## 2015-10-28 MED ORDER — VITAMIN D (ERGOCALCIFEROL) 1.25 MG (50000 UNIT) PO CAPS: 50000.0000 [IU] | ORAL_CAPSULE | ORAL | 0 refills | Status: DC

## 2015-10-28 MED ORDER — SYNTHROID 100 MCG PO TABS: 100.0000 ug | ORAL_TABLET | Freq: Every morning | ORAL | 3 refills | Status: DC

## 2015-10-28 NOTE — Telephone Encounter (Signed)
Relation to [id number] Call back [phone] Pharmacy:  Reason for call:  Patient calling back regarding her imaging results states the nurse that called she didn't understand due to phone issues. Patient would like a call back

## 2015-10-28 NOTE — Telephone Encounter (Signed)
Spoke w/ Pt, informed her that Lenna Sciara was not here today to results ultrasound. Informed her that Lenna Sciara will be back in the office tomorrow.

## 2015-10-28 NOTE — Telephone Encounter (Signed)
Spoke w/ Pt, informed of lab results. Vit D 50,000 units 1 tab weekly, #12 and 0RF and Synthroid sent to [facility]. Vitamin D ordered to be completed in 12 weeks and lab appt scheduled.

## 2015-10-29 ENCOUNTER — Telehealth: Payer: Self-pay | Admitting: Family

## 2015-10-29 DIAGNOSIS — N281 Cyst of kidney, acquired: Secondary | ICD-10-CM

## 2015-10-29 NOTE — Telephone Encounter (Signed)
Spoke with pt re: below results and answered all questions. Pt also concerned about Hgb a1c and TSH levels due to her "not feeling good and gaining weight". Advised her that her levels are stable and TSH improved and to continue dietary management of her diabetes and current dose of levothyroxine. Also advised pt per verbal from PCP that she could use intermittent heating pad and take 650mg  of tylenol every 6 hours x 1 week and let us know if symptoms do not improve.

## 2015-10-29 NOTE — Telephone Encounter (Signed)
Spoke with patient, patient stated she understand [place] result, and follow up instruction with Radiology.

## 2015-10-29 NOTE — Telephone Encounter (Signed)
Please contact patient and let her know that I reviewed her [place]. Gallbladder looks good, no gallstones. Incidental finding of a kidney cyst is made. Radiologist recommends additional imaging to look closer at the cyst.  I have pended a CT.

## 2015-11-02 ENCOUNTER — Telehealth: Payer: Self-pay | Admitting: Family

## 2015-11-02 NOTE — Telephone Encounter (Signed)
Spoke with pt. She states that she was contacted twice by the radiology dept with different instructions for her CT abd/pelvis for tomorrow and she wants clarification. Spoke with Destiny in Radiology and confirmed that the CT technologist advised pt that she will not need oral drink for the CT but can arrive at 9:15am and drink 2 cups of water. Pt also questions why she is having CT instead of MRI? Per verbal from PCP, oral drink probably not needed as they are only looking at the kidney (not bowels) and we ordered CT as it was more likely to be covered by insurance than and MRI first.  Notified pt and she voices understanding.

## 2015-11-02 NOTE — Telephone Encounter (Signed)
°  Relationship to patient: Self  Can be reached: [phone]  Reason for call: Patient would like a call back about the CT she is having tomorrow. States she has been told two different things. She wants to know if she is to have contrast or water. Plse adv or she will cancel the CT for tomorrow.

## 2015-11-03 ENCOUNTER — Telehealth: Payer: Self-pay | Admitting: *Deleted

## 2015-11-03 ENCOUNTER — Encounter (HOSPITAL_BASED_OUTPATIENT_CLINIC_OR_DEPARTMENT_OTHER): Payer: Self-pay

## 2015-11-03 ENCOUNTER — Ambulatory Visit (HOSPITAL_BASED_OUTPATIENT_CLINIC_OR_DEPARTMENT_OTHER)
Admission: RE | Admit: 2015-11-03 | Discharge: 2015-11-03 | Disposition: A | Discharge status: Home / Self Care | Payer: Medicare Other | Source: Ambulatory Visit | Attending: Family | Admitting: Family

## 2015-11-03 DIAGNOSIS — Q6100 Congenital renal cyst, unspecified: Secondary | ICD-10-CM | POA: Diagnosis not present

## 2015-11-03 DIAGNOSIS — I700 Atherosclerosis of aorta: Secondary | ICD-10-CM | POA: Diagnosis not present

## 2015-11-03 DIAGNOSIS — I2510 Atherosclerotic heart disease of native coronary artery without angina pectoris: Secondary | ICD-10-CM | POA: Diagnosis not present

## 2015-11-03 DIAGNOSIS — N289 Disorder of kidney and ureter, unspecified: Secondary | ICD-10-CM | POA: Insufficient documentation

## 2015-11-03 DIAGNOSIS — N281 Cyst of kidney, acquired: Secondary | ICD-10-CM | POA: Diagnosis not present

## 2015-11-03 DIAGNOSIS — K5730 Diverticulosis of large intestine without perforation or abscess without bleeding: Secondary | ICD-10-CM | POA: Diagnosis not present

## 2015-11-03 MED ORDER — IOPAMIDOL (ISOVUE-370) INJECTION 76%
80.0000 mL | Freq: Once | INTRAVENOUS | Status: AC | PRN
  Administered 2015-11-03: 80 mL via INTRAVENOUS

## 2015-11-03 NOTE — Telephone Encounter (Signed)
-----   Message from Eliane Decree sent at 11/03/2015  8:19 AM EDT ----- Regarding: RE: UDS I didn't put her on the list either, I thought Melissa just wanted to know if she was on anything , I shouldve have called you to ask if needed to be done. Ill ask Anderson Malta to credit it. ----- Message ----- From: Ronny Flurry, CMA Sent: 11/02/2015   4:47 PM To: Amy Asa Subject: UDS                                            We received a UDS result on pt from 10/27/15 and we aren't sure why it was sent to you? Pt is not currently on any controlled medications. Can you credit this back to pt?

## 2015-11-24 ENCOUNTER — Ambulatory Visit: Payer: Medicare Other | Admitting: Medical

## 2015-11-24 ENCOUNTER — Telehealth: Payer: Self-pay | Admitting: Family

## 2015-11-24 DIAGNOSIS — Z0289 Encounter for other administrative examinations: Secondary | ICD-10-CM

## 2015-11-24 NOTE — Telephone Encounter (Signed)
Zpak does not treat allergies. I would recommend that she schedule an office visit for further evaluation and recommendation.

## 2015-11-24 NOTE — Telephone Encounter (Signed)
Notified pt. She became very upset that antibiotic would not be called. States she "knows what she has and her previous doctor always called that in for her and it works for her".  Again, advised pt she will need office evaluation before an antibiotic would be called in. Pt states she will come up here right now. Advised pt PCP had no openings this afternoon but we could get her in with another Provider. Appt scheduled for 4pm today with PA, Saguier.

## 2015-11-24 NOTE — Telephone Encounter (Signed)
Pt called in at 4:00 to cancel her appt with provider.

## 2015-11-24 NOTE — Telephone Encounter (Signed)
Patient is requesting that we send in a Z-PAC to her pharmacy because her allergies are very bad right now. She states that a different medication was prescribed to her last year and that didn't help her at all.   Patient Phone: [phone] Pharmacy: [place], [place]

## 2015-11-25 ENCOUNTER — Encounter: Payer: Self-pay | Admitting: Family

## 2015-12-27 ENCOUNTER — Telehealth: Payer: Self-pay | Admitting: Cardiovascular Disease

## 2015-12-27 DIAGNOSIS — R252 Cramp and spasm: Secondary | ICD-10-CM

## 2015-12-27 NOTE — Telephone Encounter (Signed)
Patient called to schedule visit with Dr. Burt Knack.  She is scheduled for 03/31/16.  SHe also states she has had some chest discomfort int he past.  Please call

## 2015-12-27 NOTE — Telephone Encounter (Signed)
I spoke with the pt and the pain that she complained of earlier is the same pain that she has experienced for some time now.  The pt's main complaint at this time is cramping in her legs which she relates to chlorthalidone.  I advised the pt that we can check a BMP at this time to assess her potassium level.  Lab appointment scheduled on 01/04/16.

## 2016-01-04 ENCOUNTER — Other Ambulatory Visit: Payer: Medicare Other

## 2016-01-24 ENCOUNTER — Encounter: Payer: Medicare Other | Admitting: Family

## 2016-01-24 ENCOUNTER — Other Ambulatory Visit: Payer: Medicare Other

## 2016-03-13 ENCOUNTER — Telehealth: Payer: Self-pay | Admitting: *Deleted

## 2016-03-13 NOTE — Telephone Encounter (Signed)
Pt states she is no longer a pt here

## 2016-03-24 ENCOUNTER — Other Ambulatory Visit (HOSPITAL_COMMUNITY)
Admission: RE | Admit: 2016-03-24 | Discharge: 2016-03-24 | Disposition: A | Discharge status: Home / Self Care | Payer: Medicare Other | Source: Ambulatory Visit | Attending: Obstetrics & Gynecology | Admitting: Obstetrics & Gynecology

## 2016-03-24 ENCOUNTER — Ambulatory Visit (INDEPENDENT_AMBULATORY_CARE_PROVIDER_SITE_OTHER): Payer: Medicare Other | Admitting: Obstetrics & Gynecology

## 2016-03-24 ENCOUNTER — Encounter: Payer: Self-pay | Admitting: Obstetrics & Gynecology

## 2016-03-24 VITALS — BP 142/71 | HR 57 | Wt 163.0 lb

## 2016-03-24 DIAGNOSIS — B029 Zoster without complications: Secondary | ICD-10-CM | POA: Diagnosis not present

## 2016-03-24 DIAGNOSIS — N899 Noninflammatory disorder of vagina, unspecified: Secondary | ICD-10-CM | POA: Insufficient documentation

## 2016-03-24 DIAGNOSIS — N949 Unspecified condition associated with female genital organs and menstrual cycle: Secondary | ICD-10-CM | POA: Diagnosis not present

## 2016-03-24 NOTE — Progress Notes (Signed)
History:  81 y.o. G1P1 here today for eval of vaginal itching and burning. Pt reports that she has a prolonged h/o this but, it has gotten worse since she was treated for a sinus infection. She reports more itching. She also reports a h/o of a rash on her side since that time. She reports that it has been painful and itchy. She was given a steroid cream to treat the 'rash.'   She said that it was worse and is better and she denies any new lesions.    The following portions of the patient's history were reviewed and updated as appropriate: allergies, current medications, past family history, past medical history, past social history, past surgical history and problem list.  Review of Systems:  Pertinent items are noted in HPI.   Objective:  Physical Exam Blood pressure (!) 142/71, pulse (!) 57, weight 163 lb (73.9 kg). BP (!) 142/71 (BP Location: Left Arm, Patient Position: Sitting)   Pulse (!) 57   Wt 163 lb (73.9 kg)   BMI 29.81 kg/m  CONSTITUTIONAL: Well-developed, well-nourished female in no acute distress.  HENT:  Normocephalic, atraumatic EYES: Conjunctivae and EOM are normal. No scleral icterus.  NECK: Normal range of motion SKIN: Skin is warm and dry. There is a rash on her right side that does NOT cross the midline.  Not diaphoretic.No pallor. [place]: Alert and oriented to person, place, and time. Normal coordination.   Abd: Soft, nontender and nondistended Pelvic: Normal appearing external genitalia; normal appearing vaginal mucosa and cervix.  Normal discharge.  Small uterus, no other palpable masses, no uterine or adnexal tenderness  Labs and Imaging No results found.  Assessment & Plan:  Shingles for 1 month- no active lesions  rec sx relief  Refer to primary care provider  No evidence of PHN   rec STOP steroid cream  Elevated BP-   pt is in between primary care providers  rec continue taking HTN meds  Have assisted pt with getting appt with her new primary care  provider on 3 days   Atrophic vaginitis  rec coconut oil to vulva/vagina tid  After 1 month restart Estrace nightly  F/u in 2 months  F/u KOH and wet smear- if yeast is noted, will tx with Diflucan x1   Total face-to-face time with patient was 30 min.  Greater than 50% was spent in counseling and coordination of care with the patient. We discussed: see above.    Kisha Messman L. Harraway-Smith, M.D., Cherlynn June

## 2016-03-24 NOTE — Progress Notes (Signed)
Vaginal burning for "several years" . Kathrene Alu RN BSN

## 2016-03-24 NOTE — Patient Instructions (Signed)
Shingles Shingles, which is also known as herpes zoster, is an infection that causes a painful skin rash and fluid-filled blisters. Shingles is not related to genital herpes, which is a sexually transmitted infection. Shingles only develops in people who:  Have had chickenpox.  Have received the chickenpox vaccine. (This is rare.)  What are the causes? Shingles is caused by varicella-zoster virus (VZV). This is the same virus that causes chickenpox. After exposure to VZV, the virus stays in the body in an inactive (dormant) state. Shingles develops if the virus reactivates. This can happen many years after the initial exposure to VZV. It is not known what causes this virus to reactivate. What increases the risk? People who have had chickenpox or received the chickenpox vaccine are at risk for shingles. Infection is more common in people who:  Are older than age 50.  Have a weakened defense (immune) system, such as those with HIV, AIDS, or cancer.  Are taking medicines that weaken the immune system, such as transplant medicines.  Are under great stress.  What are the signs or symptoms? Early symptoms of this condition include itching, tingling, and pain in an area on your skin. Pain may be described as burning, stabbing, or throbbing. A few days or weeks after symptoms start, a painful red rash appears, usually on one side of the body in a bandlike or beltlike pattern. The rash eventually turns into fluid-filled blisters that break open, scab over, and dry up in about 2-3 weeks. At any time during the infection, you may also develop:  A fever.  Chills.  A headache.  An upset stomach.  How is this diagnosed? This condition is diagnosed with a skin exam. Sometimes, skin or fluid samples are taken from the blisters before a diagnosis is made. These samples are examined under a microscope or sent to a lab for testing. How is this treated? There is no specific cure for this condition.  Your health care provider will probably prescribe medicines to help you manage pain, recover more quickly, and avoid long-term problems. Medicines may include:  Antiviral drugs.  Anti-inflammatory drugs.  Pain medicines.  If the area involved is on your face, you may be referred to a specialist, such as an eye doctor (ophthalmologist) or an ear, nose, and throat (ENT) doctor to help you avoid eye problems, chronic pain, or disability. Follow these instructions at home: Medicines  Take medicines only as directed by your health care provider.  Apply an anti-itch or numbing cream to the affected area as directed by your health care provider. Blister and Rash Care  Take a cool bath or apply cool compresses to the area of the rash or blisters as directed by your health care provider. This may help with pain and itching.  Keep your rash covered with a loose bandage (dressing). Wear loose-fitting clothing to help ease the pain of material rubbing against the rash.  Keep your rash and blisters clean with mild soap and cool water or as directed by your health care provider.  Check your rash every day for signs of infection. These include redness, swelling, and pain that lasts or increases.  Do not pick your blisters.  Do not scratch your rash. General instructions  Rest as directed by your health care provider.  Keep all follow-up visits as directed by your health care provider. This is important.  Until your blisters scab over, your infection can cause chickenpox in people who have never had it or been vaccinated   against it. To prevent this from happening, avoid contact with other people, especially: ? Babies. ? Pregnant women. ? Children who have eczema. ? Elderly people who have transplants. ? People who have chronic illnesses, such as leukemia or AIDS. Contact a health care provider if:  Your pain is not relieved with prescribed medicines.  Your pain does not get better after  the rash heals.  Your rash looks infected. Signs of infection include redness, swelling, and pain that lasts or increases. Get help right away if:  The rash is on your face or nose.  You have facial pain, pain around your eye area, or loss of feeling on one side of your face.  You have ear pain or you have ringing in your ear.  You have loss of taste.  Your condition gets worse. This information is not intended to replace advice given to you by your health care provider. Make sure you discuss any questions you have with your health care provider. Document Released: 02/13/2005 Document Revised: 10/10/2015 Document Reviewed: 12/25/2013 Elsevier Interactive Patient Education  2017 Elsevier Inc.  

## 2016-03-28 LAB — CERVICOVAGINAL ANCILLARY ONLY
BACTERIAL VAGINITIS: NEGATIVE
Candida vaginitis: NEGATIVE
Trichomonas: NEGATIVE

## 2016-03-30 ENCOUNTER — Telehealth: Payer: Self-pay

## 2016-03-30 NOTE — Telephone Encounter (Signed)
-----   Message from Guss Bunde, MD sent at 03/30/2016  1:27 PM EST ----- Wet prep negative.  RN to call.

## 2016-03-30 NOTE — Telephone Encounter (Signed)
Patient call and made aware that cultures are negative. Kathrene Alu RN BSN

## 2016-03-31 ENCOUNTER — Encounter (INDEPENDENT_AMBULATORY_CARE_PROVIDER_SITE_OTHER): Payer: Self-pay

## 2016-03-31 ENCOUNTER — Ambulatory Visit (INDEPENDENT_AMBULATORY_CARE_PROVIDER_SITE_OTHER): Payer: Medicare Other | Admitting: Cardiovascular Disease

## 2016-03-31 ENCOUNTER — Encounter: Payer: Self-pay | Admitting: Cardiovascular Disease

## 2016-03-31 VITALS — BP 112/70 | HR 67 | Ht 62.0 in | Wt 166.8 lb

## 2016-03-31 DIAGNOSIS — I2510 Atherosclerotic heart disease of native coronary artery without angina pectoris: Secondary | ICD-10-CM | POA: Diagnosis not present

## 2016-03-31 DIAGNOSIS — I10 Essential (primary) hypertension: Secondary | ICD-10-CM

## 2016-03-31 DIAGNOSIS — E785 Hyperlipidemia, unspecified: Secondary | ICD-10-CM

## 2016-03-31 MED ORDER — PRAVASTATIN SODIUM 40 MG PO TABS: 40.0000 mg | ORAL_TABLET | Freq: Every evening | ORAL | 8 refills | Status: DC

## 2016-03-31 MED ORDER — PRAVASTATIN SODIUM 40 MG PO TABS: 40.0000 mg | ORAL_TABLET | Freq: Every evening | ORAL | 3 refills | Status: DC

## 2016-03-31 NOTE — Patient Instructions (Signed)
Medication Instructions:  Your physician has recommended you make the following change in your medication:  1. START Pravastatin 40mg  take one tablet by mouth daily  Labwork: Your physician recommends that you return for a FASTING LIPID and LIVER in 3 MONTHS--nothing to eat or drink after midnight, lab opens at 7:30 AM  Testing/Procedures: No new orders.   Follow-Up: Your physician wants you to follow-up in: 6 MONTHS with Dr Burt Knack.  You will receive a reminder letter in the mail two months in advance. If you don't receive a letter, please call our office to schedule the follow-up appointment.   Any Other Special Instructions Will Be Listed Below (If Applicable).     If you need a refill on your cardiac medications before your next appointment, please call your pharmacy.

## 2016-03-31 NOTE — Progress Notes (Signed)
Cardiology Office Note Date:  03/31/2016   ID:  Amanda Curtis, DOB Oct 05, 1935, MRN [id number]  PCP:  No primary care provider on file.  Cardiologist:  Sherren Mocha, MD    Chief Complaint  Patient presents with  . Coronary Artery Disease    History of Present Illness: Amanda Curtis is a 81 y.o. female who presents for follow-up evaluation. She had CABG in 2015. Other problems include hypertension, hyperlipidemia, and bilateral carotid stenosis. She's statin-intolerant and has been seen in the [facility] but hasn't followed through on any recommendations. When seen last in July 2017 her BP was uncontrolled and chlorthalidone was added. She has tolerated it well.   She complains of leg cramps. No chest pain or dyspnea. She has been off of statin medications. She tolerated pravastatin better than any other meds.  Past Medical History:  Diagnosis Date  . Arthritis   . Diabetes mellitus without complication ([place])   . Graves disease 2000   s/p RIA  . Hypertension   . Insomnia   . Vitamin D deficiency     Past Surgical History:  Procedure Laterality Date  . APPENDECTOMY    . CORONARY ARTERY BYPASS GRAFT N/A 07/04/2013   Procedure: CORONARY ARTERY BYPASS GRAFTING (CABG) times four using left internal mammary artery and right saphenous leg vein.;  Surgeon: Gaye Pollack, MD;  Location: [place] OR;  Service: Open Heart Surgery;  Laterality: N/A;  . INTRAOPERATIVE TRANSESOPHAGEAL ECHOCARDIOGRAM N/A 07/04/2013   Procedure: INTRAOPERATIVE TRANSESOPHAGEAL ECHOCARDIOGRAM;  Surgeon: Gaye Pollack, MD;  Location: [place] OR;  Service: Open Heart Surgery;  Laterality: N/A;  . LEFT HEART CATHETERIZATION WITH CORONARY ANGIOGRAM N/A 06/30/2013   Procedure: LEFT HEART CATHETERIZATION WITH CORONARY ANGIOGRAM;  Surgeon: Blane Ohara, MD;  Location: [facility] CATH LAB;  Service: Cardiovascular;  Laterality: N/A;  . NASAL SEPTUM SURGERY     childhood    Current Outpatient Prescriptions  Medication Sig Dispense  Refill  . acetaminophen (TYLENOL) 325 MG tablet Take 2 tablets (650 mg total) by mouth every 6 (six) hours as needed for mild pain.    [name] aspirin EC 81 MG tablet Take 81 mg by mouth daily.    . chlorthalidone (HYGROTON) 25 MG tablet Take 1 tablet (25 mg total) by mouth daily. 90 tablet 3  . fluticasone (FLONASE) 50 MCG/ACT nasal spray Place 2 sprays into both nostrils daily. 16 g 6  . losartan (COZAAR) 100 MG tablet Take 50 mg by mouth daily.    [name] SYNTHROID 100 MCG tablet Take 1 tablet (100 mcg total) by mouth every morning. (Patient taking differently: Take 112 mcg by mouth every morning. ) 30 tablet 3  . Vitamin D, Ergocalciferol, (DRISDOL) 50000 units CAPS capsule Take 1 capsule (50,000 Units total) by mouth every 7 (seven) days. 12 capsule 0  . pravastatin (PRAVACHOL) 40 MG tablet Take 1 tablet (40 mg total) by mouth every evening. 30 tablet 8   No current facility-administered medications for this visit.     Allergies:   Metformin; Ampicillin; Atorvastatin; Codeine; Elavil [amitriptyline]; Hctz [hydrochlorothiazide]; Hytrin [terazosin]; Wasp venom; and Erythromycin   Social History:  The patient  reports that she has never smoked. She has never used smokeless tobacco. She reports that she drinks alcohol. She reports that she does not use drugs.   Family History:  The patient's  family history includes Cancer in her mother; Dementia in her mother; Diabetes in her father and paternal grandmother; Hypertension in her paternal grandmother.  ROS:  Please see the history of present illness.  Otherwise, review of systems is positive for abdominal pain, depression, back pain, dizziness, excessive fatigue, constipation, anxiety.  All other systems are reviewed and negative.    PHYSICAL EXAM: VS:  BP 112/70   Pulse 67   Ht 5\' 2"  (1.575 m)   Wt 166 lb 12.8 oz (75.7 kg)   BMI 30.51 kg/m  , BMI Body mass index is 30.51 kg/m. GEN: Well nourished, well developed, in no acute distress  HEENT:  normal  Neck: no JVD, no masses. No carotid bruits Cardiac: RRR without murmur or gallop                Respiratory:  clear to auscultation bilaterally, normal work of breathing GI: soft, nontender, nondistended, + BS MS: no deformity or atrophy  Ext: no pretibial edema, pedal pulses 2+= bilaterally Skin: warm and dry, no rash Neuro:  Strength and sensation are intact Psych: euthymic mood, full affect  EKG:  EKG is ordered today. The ekg ordered today shows NSR 67 bpm, nonspecific ST and T wave abnormality  Recent Labs: 05/18/2015: Hemoglobin 12.5; Platelets 476.0 10/27/2015: ALT 10; BUN 35; Creatinine, Ser 1.39; Potassium 4.6; Sodium 134; TSH 2.38   Lipid Panel     Component Value Date/Time   CHOL 280 (H) 03/15/2015 0908   TRIG 234 (H) 03/15/2015 0908   HDL 42 (L) 03/15/2015 0908   CHOLHDL 6.7 (H) 03/15/2015 0908   VLDL 47 (H) 03/15/2015 0908   LDLCALC 191 (H) 03/15/2015 0908   LDLDIRECT 227.0 11/30/2014 1534      Wt Readings from Last 3 Encounters:  03/31/16 166 lb 12.8 oz (75.7 kg)  03/24/16 163 lb (73.9 kg)  10/27/15 158 lb 6.4 oz (71.8 kg)     ASSESSMENT AND PLAN: 1.  CAD, native vessel, without angina: pt doing well after CABG. meds will be continued.   2. Hyperlipidemia, uncontrolled. The patient has a long history of statin intolerance. She has been off of her medicine and her lipids are markedly elevated with a total cholesterol over 300 and LDL cholesterol over 200 mg/dL. We discussed potential treatment options. Will start her back on pravastatin 40 mg as this is the only statin drug she can take. Will repeat labs in 3 months and add zetia at that time as tolerated. She has multiple medicine intolerances and I did not think it would be a good idea to start to medicines at the same time.  3. Hypertension: Blood pressure is now controlled on chlorthalidone and losartan.  Current medicines are reviewed with the patient today.  The patient does not have concerns  regarding medicines.  Labs/ tests ordered today include:   Orders Placed This Encounter  Procedures  . Lipid panel  . Hepatic function panel  . EKG 12-Lead    Disposition:   FU 6 months  Signed, Sherren Mocha, MD  03/31/2016 5:40 PM    [facility] [place], [place], [place]  [address] Phone: [phone]; Fax: [phone]

## 2016-06-07 ENCOUNTER — Other Ambulatory Visit: Payer: Self-pay | Admitting: Family

## 2016-06-07 NOTE — Telephone Encounter (Signed)
Rx request Denied as patient is no longer under our care, transferred to [facility] 03/27/16 per EMR/SLS 04/11

## 2016-06-28 ENCOUNTER — Other Ambulatory Visit: Payer: Medicare Other | Admitting: *Deleted

## 2016-06-28 ENCOUNTER — Encounter (INDEPENDENT_AMBULATORY_CARE_PROVIDER_SITE_OTHER): Payer: Self-pay

## 2016-06-28 DIAGNOSIS — I10 Essential (primary) hypertension: Secondary | ICD-10-CM

## 2016-06-28 DIAGNOSIS — E785 Hyperlipidemia, unspecified: Secondary | ICD-10-CM

## 2016-06-28 DIAGNOSIS — I2510 Atherosclerotic heart disease of native coronary artery without angina pectoris: Secondary | ICD-10-CM

## 2016-06-29 LAB — HEPATIC FUNCTION PANEL
ALBUMIN: 4.3 g/dL (ref 3.5–4.8)
ALT: 15 IU/L (ref 0–32)
AST: 16 IU/L (ref 0–40)
Alkaline Phosphatase: 88 IU/L (ref 39–117)
Bilirubin Total: 0.3 mg/dL (ref 0.0–1.2)
Bilirubin, Direct: 0.11 mg/dL (ref 0.00–0.40)
Total Protein: 7.3 g/dL (ref 6.0–8.5)

## 2016-06-29 LAB — LIPID PANEL
CHOLESTEROL TOTAL: 289 mg/dL — AB (ref 100–199)
Chol/HDL Ratio: 7.4 ratio — ABNORMAL HIGH (ref 0.0–4.4)
HDL: 39 mg/dL — ABNORMAL LOW (ref >39)
LDL CALC: 191 mg/dL — AB (ref 0–99)
Triglycerides: 293 mg/dL — ABNORMAL HIGH (ref 0–149)
VLDL Cholesterol Cal: 59 mg/dL — ABNORMAL HIGH (ref 5–40)

## 2016-08-16 ENCOUNTER — Other Ambulatory Visit: Payer: Self-pay | Admitting: Cardiovascular Disease

## 2016-09-25 ENCOUNTER — Other Ambulatory Visit: Payer: Self-pay | Admitting: Cardiovascular Disease

## 2016-09-25 DIAGNOSIS — E785 Hyperlipidemia, unspecified: Secondary | ICD-10-CM

## 2016-09-25 DIAGNOSIS — I10 Essential (primary) hypertension: Secondary | ICD-10-CM

## 2016-09-26 ENCOUNTER — Other Ambulatory Visit: Payer: Self-pay | Admitting: Cardiovascular Disease

## 2016-09-26 DIAGNOSIS — I10 Essential (primary) hypertension: Secondary | ICD-10-CM

## 2016-09-26 DIAGNOSIS — E785 Hyperlipidemia, unspecified: Secondary | ICD-10-CM

## 2016-10-01 ENCOUNTER — Other Ambulatory Visit: Payer: Self-pay | Admitting: Cardiovascular Disease

## 2016-11-01 ENCOUNTER — Emergency Department (HOSPITAL_BASED_OUTPATIENT_CLINIC_OR_DEPARTMENT_OTHER): Payer: Medicare Other

## 2016-11-01 ENCOUNTER — Observation Stay (HOSPITAL_COMMUNITY): Payer: Medicare Other

## 2016-11-01 ENCOUNTER — Encounter (HOSPITAL_BASED_OUTPATIENT_CLINIC_OR_DEPARTMENT_OTHER): Payer: Self-pay

## 2016-11-01 ENCOUNTER — Observation Stay (HOSPITAL_BASED_OUTPATIENT_CLINIC_OR_DEPARTMENT_OTHER)
Admission: EM | Admit: 2016-11-01 | Discharge: 2016-11-02 | Disposition: A | Discharge status: Home / Self Care | Payer: Medicare Other | Attending: Internal Medicine | Admitting: Internal Medicine

## 2016-11-01 DIAGNOSIS — I6782 Cerebral ischemia: Secondary | ICD-10-CM | POA: Insufficient documentation

## 2016-11-01 DIAGNOSIS — Z881 Allergy status to other antibiotic agents status: Secondary | ICD-10-CM | POA: Diagnosis not present

## 2016-11-01 DIAGNOSIS — Z5309 Procedure and treatment not carried out because of other contraindication: Secondary | ICD-10-CM

## 2016-11-01 DIAGNOSIS — Z79899 Other long term (current) drug therapy: Secondary | ICD-10-CM | POA: Diagnosis not present

## 2016-11-01 DIAGNOSIS — Z951 Presence of aortocoronary bypass graft: Secondary | ICD-10-CM | POA: Insufficient documentation

## 2016-11-01 DIAGNOSIS — Z950 Presence of cardiac pacemaker: Secondary | ICD-10-CM | POA: Diagnosis not present

## 2016-11-01 DIAGNOSIS — M1990 Unspecified osteoarthritis, unspecified site: Secondary | ICD-10-CM | POA: Insufficient documentation

## 2016-11-01 DIAGNOSIS — Z880 Allergy status to penicillin: Secondary | ICD-10-CM | POA: Diagnosis not present

## 2016-11-01 DIAGNOSIS — G4700 Insomnia, unspecified: Secondary | ICD-10-CM | POA: Insufficient documentation

## 2016-11-01 DIAGNOSIS — R42 Dizziness and giddiness: Secondary | ICD-10-CM

## 2016-11-01 DIAGNOSIS — E119 Type 2 diabetes mellitus without complications: Secondary | ICD-10-CM | POA: Diagnosis not present

## 2016-11-01 DIAGNOSIS — J3489 Other specified disorders of nose and nasal sinuses: Secondary | ICD-10-CM | POA: Diagnosis not present

## 2016-11-01 DIAGNOSIS — E0500 Thyrotoxicosis with diffuse goiter without thyrotoxic crisis or storm: Secondary | ICD-10-CM | POA: Diagnosis not present

## 2016-11-01 DIAGNOSIS — Z7982 Long term (current) use of aspirin: Secondary | ICD-10-CM | POA: Insufficient documentation

## 2016-11-01 DIAGNOSIS — D352 Benign neoplasm of pituitary gland: Secondary | ICD-10-CM | POA: Diagnosis present

## 2016-11-01 DIAGNOSIS — E039 Hypothyroidism, unspecified: Secondary | ICD-10-CM | POA: Diagnosis not present

## 2016-11-01 DIAGNOSIS — Z885 Allergy status to narcotic agent status: Secondary | ICD-10-CM | POA: Insufficient documentation

## 2016-11-01 DIAGNOSIS — R0981 Nasal congestion: Secondary | ICD-10-CM | POA: Diagnosis not present

## 2016-11-01 DIAGNOSIS — Z888 Allergy status to other drugs, medicaments and biological substances status: Secondary | ICD-10-CM | POA: Insufficient documentation

## 2016-11-01 DIAGNOSIS — I10 Essential (primary) hypertension: Secondary | ICD-10-CM | POA: Diagnosis not present

## 2016-11-01 DIAGNOSIS — I160 Hypertensive urgency: Secondary | ICD-10-CM | POA: Diagnosis not present

## 2016-11-01 LAB — CBC
HEMATOCRIT: 40.2 % (ref 36.0–46.0)
Hemoglobin: 12.5 g/dL (ref 12.0–15.0)
MCH: 28.2 pg (ref 26.0–34.0)
MCHC: 31.1 g/dL (ref 30.0–36.0)
MCV: 90.7 fL (ref 78.0–100.0)
PLATELETS: 465 10*3/uL — AB (ref 150–400)
RBC: 4.43 MIL/uL (ref 3.87–5.11)
RDW: 16.4 % — AB (ref 11.5–15.5)
WBC: 9.2 10*3/uL (ref 4.0–10.5)

## 2016-11-01 LAB — URINALYSIS, ROUTINE W REFLEX MICROSCOPIC
Bilirubin Urine: NEGATIVE
Glucose, UA: NEGATIVE mg/dL
KETONES UR: NEGATIVE mg/dL
LEUKOCYTES UA: NEGATIVE
NITRITE: NEGATIVE
PROTEIN: NEGATIVE mg/dL
Specific Gravity, Urine: <1.005 — ABNORMAL LOW (ref 1.005–1.030)
pH: 7 (ref 5.0–8.0)

## 2016-11-01 LAB — URINALYSIS, MICROSCOPIC (REFLEX)

## 2016-11-01 LAB — BASIC METABOLIC PANEL
Anion gap: 7 (ref 5–15)
BUN: 26 mg/dL — ABNORMAL HIGH (ref 6–20)
CO2: 26 mmol/L (ref 22–32)
Calcium: 8.7 mg/dL — ABNORMAL LOW (ref 8.9–10.3)
Chloride: 103 mmol/L (ref 101–111)
Creatinine, Ser: 1.32 mg/dL — ABNORMAL HIGH (ref 0.44–1.00)
GFR calc Af Amer: 44 mL/min — ABNORMAL LOW (ref >60)
GFR, EST NON AFRICAN AMERICAN: 38 mL/min — AB (ref >60)
Glucose, Bld: 118 mg/dL — ABNORMAL HIGH (ref 65–99)
POTASSIUM: 3.6 mmol/L (ref 3.5–5.1)
SODIUM: 136 mmol/L (ref 135–145)

## 2016-11-01 LAB — CBG MONITORING, ED: Glucose-Capillary: 115 mg/dL — ABNORMAL HIGH (ref 65–99)

## 2016-11-01 LAB — TROPONIN I: Troponin I: <0.03 ng/mL (ref <0.03)

## 2016-11-01 LAB — TSH: TSH: 16.01 u[IU]/mL — ABNORMAL HIGH (ref 0.350–4.500)

## 2016-11-01 MED ORDER — MECLIZINE HCL 25 MG PO TABS
12.5000 mg | ORAL_TABLET | Freq: Once | ORAL | Status: AC
  Administered 2016-11-01: 12.5 mg via ORAL
  Filled 2016-11-01: qty 1

## 2016-11-01 MED ORDER — PRAVASTATIN SODIUM 40 MG PO TABS
40.0000 mg | ORAL_TABLET | Freq: Every evening | ORAL | Status: DC
  Administered 2016-11-01: 40 mg via ORAL
  Filled 2016-11-01: qty 1

## 2016-11-01 MED ORDER — GADOBENATE DIMEGLUMINE 529 MG/ML IV SOLN
10.0000 mL | Freq: Once | INTRAVENOUS | Status: AC | PRN
  Administered 2016-11-01: 7 mL via INTRAVENOUS

## 2016-11-01 MED ORDER — LEVOTHYROXINE SODIUM 100 MCG PO TABS: 100.0000 ug | ORAL_TABLET | Freq: Every day | ORAL | Status: DC

## 2016-11-01 MED ORDER — ACETAMINOPHEN 325 MG PO TABS: 650.0000 mg | ORAL_TABLET | Freq: Four times a day (QID) | ORAL | Status: DC | PRN

## 2016-11-01 MED ORDER — ONDANSETRON HCL 4 MG PO TABS
4.0000 mg | ORAL_TABLET | Freq: Four times a day (QID) | ORAL | Status: DC | PRN
Start: 2016-11-01 — End: 2016-11-02

## 2016-11-01 MED ORDER — DIPHENHYDRAMINE HCL 50 MG/ML IJ SOLN
12.5000 mg | Freq: Once | INTRAMUSCULAR | Status: AC
  Administered 2016-11-01: 12.5 mg via INTRAVENOUS
  Filled 2016-11-01: qty 1

## 2016-11-01 MED ORDER — ASPIRIN EC 81 MG PO TBEC
81.0000 mg | DELAYED_RELEASE_TABLET | Freq: Every day | ORAL | Status: DC
  Administered 2016-11-02: 81 mg via ORAL
  Filled 2016-11-01: qty 1

## 2016-11-01 MED ORDER — LOSARTAN POTASSIUM 50 MG PO TABS
50.0000 mg | ORAL_TABLET | Freq: Once | ORAL | Status: DC
  Filled 2016-11-01: qty 1

## 2016-11-01 MED ORDER — ONDANSETRON HCL 4 MG/2ML IJ SOLN: 4.0000 mg | Freq: Four times a day (QID) | INTRAMUSCULAR | Status: DC | PRN

## 2016-11-01 MED ORDER — ACETAMINOPHEN 650 MG RE SUPP
650.0000 mg | Freq: Four times a day (QID) | RECTAL | Status: DC | PRN
Start: 2016-11-01 — End: 2016-11-02

## 2016-11-01 MED ORDER — HYDRALAZINE HCL 20 MG/ML IJ SOLN
5.0000 mg | Freq: Once | INTRAMUSCULAR | Status: AC
  Administered 2016-11-01: 5 mg via INTRAVENOUS
  Filled 2016-11-01: qty 1

## 2016-11-01 MED ORDER — ENOXAPARIN SODIUM 40 MG/0.4ML ~~LOC~~ SOLN
40.0000 mg | SUBCUTANEOUS | Status: DC
  Administered 2016-11-01: 40 mg via SUBCUTANEOUS
  Filled 2016-11-01: qty 0.4

## 2016-11-01 MED ORDER — LOSARTAN POTASSIUM 50 MG PO TABS: 100.0000 mg | ORAL_TABLET | Freq: Every day | ORAL | Status: DC

## 2016-11-01 MED ORDER — METOCLOPRAMIDE HCL 5 MG/ML IJ SOLN
10.0000 mg | Freq: Once | INTRAMUSCULAR | Status: AC
  Administered 2016-11-01: 10 mg via INTRAVENOUS
  Filled 2016-11-01: qty 2

## 2016-11-01 MED ORDER — CHLORTHALIDONE 25 MG PO TABS
25.0000 mg | ORAL_TABLET | Freq: Every day | ORAL | Status: DC
  Administered 2016-11-02: 25 mg via ORAL
  Filled 2016-11-01: qty 1

## 2016-11-01 MED ORDER — ZOLPIDEM TARTRATE 5 MG PO TABS
5.0000 mg | ORAL_TABLET | ORAL | Status: DC | PRN
  Administered 2016-11-01: 5 mg via ORAL
  Filled 2016-11-01: qty 1

## 2016-11-01 NOTE — Progress Notes (Signed)
Received call from [facility] to observe Amanda Curtis due to dizziness and HA; found with elevated BP (hypertensive urgency) and also some changes on EKG (which appear non-specific. Patient's troponin is normal and patient denies CP or SOB. As part of work up patient also had a CT scan demonstrating pituitary mass; apparently case was discussed by Dr. Ralene Bathe with Dr. Cyndy Freeze and recommended outpatient work up. Will observe patient for better BP control and reassess EKG changes.  Barton Dubois [phone]

## 2016-11-01 NOTE — ED Triage Notes (Addendum)
Pt states she started feeling dizzy, feeling like she was going to pass out, weak while driving approx [id number] she went to [facility] where they notified her she had elevated BP and advised to go to ED-NAD-steady gait

## 2016-11-01 NOTE — ED Provider Notes (Signed)
[facility] DEPT [facility] Provider Note   CSN: [id number] Arrival date & time: 11/01/16  1235     History   Chief Complaint Chief Complaint  Patient presents with  . Dizziness    HPI LODIE Amanda Curtis is a 81 y.o. female.  The history is provided by the patient. No language interpreter was used.  Dizziness    Amanda Curtis is a 81 y.o. female who presents to the Emergency Department complaining of dizziness.  She presents for evaluation of dizziness described as lightheadedness and off balance type sensation that started today while driving home from [place]. She went to the pharmacy thinking it was related to her blood pressure because she needed to take one of her medications the pharmacist check her blood pressure was 106 systolic and referred her to the emergency department. She states overall her symptoms are improving. She does have mild associated headache.  She also has nasal congestion that has been off and on for the last few weeks. She endorses visual changes that she cannot describe it being gradual in onset She had some nausea but this is improving. No chest pain, shortness of breath, vomiting, leg swelling or pain.  Past Medical History:  Diagnosis Date  . Arthritis   . Diabetes mellitus without complication ([place])   . Graves disease 2000   s/p RIA  . Hypertension   . Insomnia   . Vitamin D deficiency     Patient Active Problem List   Diagnosis Date Noted  . Vitamin D deficiency 10/27/2015  . Vulvar atrophy 02/03/2015  . Allergy to bee sting 11/30/2014  . Dysuria 12/23/2013  . Diabetic peripheral neuropathy ([place]) 09/22/2013  . Carotid stenosis 09/08/2013  . Hyperlipidemia 07/25/2013  . Type 2 diabetes with peripheral circulatory disorder, controlled ([place]) 07/12/2013  . Unspecified constipation 07/12/2013  . S/P CABG x 4 07/04/2013  . Coronary artery disease 05/23/2013  . Anxiety 04/30/2013  . Routine general medical examination at a health care facility  03/25/2013  . Nonspecific abnormal electrocardiogram (ECG) (EKG) 03/25/2013  . Essential hypertension 02/07/2013  . Insomnia 02/07/2013  . Hypothyroidism 02/07/2013    Past Surgical History:  Procedure Laterality Date  . APPENDECTOMY    . CORONARY ARTERY BYPASS GRAFT N/A 07/04/2013   Procedure: CORONARY ARTERY BYPASS GRAFTING (CABG) times four using left internal mammary artery and right saphenous leg vein.;  Surgeon: Gaye Pollack, MD;  Location: [place] OR;  Service: Open Heart Surgery;  Laterality: N/A;  . INTRAOPERATIVE TRANSESOPHAGEAL ECHOCARDIOGRAM N/A 07/04/2013   Procedure: INTRAOPERATIVE TRANSESOPHAGEAL ECHOCARDIOGRAM;  Surgeon: Gaye Pollack, MD;  Location: [place] [facility] OR;  Service: Open Heart Surgery;  Laterality: N/A;  . LEFT HEART CATHETERIZATION WITH CORONARY ANGIOGRAM N/A 06/30/2013   Procedure: LEFT HEART CATHETERIZATION WITH CORONARY ANGIOGRAM;  Surgeon: Blane Ohara, MD;  Location: [facility] CATH LAB;  Service: Cardiovascular;  Laterality: N/A;  . NASAL SEPTUM SURGERY     childhood    OB History    Gravida Para Term Preterm AB Living   1 1           SAB TAB Ectopic Multiple Live Births           1       Home Medications    Prior to Admission medications   Medication Sig Start Date End Date Taking? Authorizing Provider  acetaminophen (TYLENOL) 325 MG tablet Take 2 tablets (650 mg total) by mouth every 6 (six) hours as needed for mild pain. 07/09/13   Theda Sers,  Gina L, PA-C  aspirin EC 81 MG tablet Take 81 mg by mouth daily.    [provider]  chlorthalidone (HYGROTON) 25 MG tablet Take 1 tablet (25 mg total) by mouth daily. 09/26/16   Sherren Mocha, MD  fluticasone [facility]) 50 MCG/ACT nasal spray Place 2 sprays into both nostrils daily. 02/16/15   Roma Schanz R, DO  losartan (COZAAR) 100 MG tablet TAKE 1 TABLET BY MOUTH EVERY DAY 10/02/16   Sherren Mocha, MD  pravastatin (PRAVACHOL) 40 MG tablet Take 1 tablet (40 mg total) by mouth every evening. 03/31/16 06/29/16   Sherren Mocha, MD  SYNTHROID 100 MCG tablet Take 1 tablet (100 mcg total) by mouth every morning. Patient taking differently: Take 112 mcg by mouth every morning.  10/28/15   Debbrah Alar, NP  Vitamin D, Ergocalciferol, (DRISDOL) 50000 units CAPS capsule Take 1 capsule (50,000 Units total) by mouth every 7 (seven) days. 10/28/15   Debbrah Alar, NP    Family History Family History  Problem Relation Age of Onset  . Dementia Mother   . Cancer Mother        breast  . Diabetes Father   . Diabetes Paternal Grandmother   . Hypertension Paternal Grandmother     Social History Social History  Substance Use Topics  . Smoking status: Never Smoker  . Smokeless tobacco: Never Used  . Alcohol use No     Allergies   Metformin; Ampicillin; Atorvastatin; Codeine; Elavil [amitriptyline]; Hctz [hydrochlorothiazide]; Hytrin [terazosin]; Wasp venom; and Erythromycin   Review of Systems Review of Systems  Neurological: Positive for dizziness.  All other systems reviewed and are negative.    Physical Exam Updated Vital Signs BP (!) 168/86   Pulse (!) 54   Temp 98.2 F (36.8 C) (Oral)   Resp 13   Ht 5\' 6"  (1.676 m)   Wt 75.8 kg (167 lb 1.7 oz)   SpO2 100%   BMI 26.97 kg/m   Physical Exam  Constitutional: She is oriented to person, place, and time. She appears well-developed and well-nourished.  HENT:  Head: Normocephalic and atraumatic.  Cardiovascular: Normal rate and regular rhythm.   No murmur heard. Pulmonary/Chest: Effort normal and breath sounds normal. No respiratory distress.  Abdominal: Soft. There is no tenderness. There is no rebound and no guarding.  Musculoskeletal: She exhibits no edema or tenderness.  Neurological: She is alert and oriented to person, place, and time. No cranial nerve deficit.  5/5 strength in all four extremities. No pronator drift. Visual fields intact.   Skin: Skin is warm and dry.  Psychiatric: She has a normal mood and affect.  Her behavior is normal.  Nursing note and vitals reviewed.    ED Treatments / Results  Labs (all labs ordered are listed, but only abnormal results are displayed) Labs Reviewed  CBC - Abnormal; Notable for the following:       Result Value   RDW 16.4 (*)    Platelets 465 (*)    All other components within normal limits  CBG MONITORING, ED - Abnormal; Notable for the following:    Glucose-Capillary 115 (*)    All other components within normal limits  BASIC METABOLIC PANEL  URINALYSIS, ROUTINE W REFLEX MICROSCOPIC    EKG  EKG Interpretation  Date/Time:  Wednesday November 01 2016 12:52:31 EDT Ventricular Rate:  58 PR Interval:    QRS Duration: 87 QT Interval:  413 QTC Calculation: 406 R Axis:   -45 Text Interpretation:  Sinus rhythm  Left anterior fascicular block Low voltage, precordial leads Abnormal R-wave progression, late transition Borderline repolarization abnormality Baseline wander in lead(s) I V1 Confirmed by Quintella Reichert [phone]) on 11/01/2016 1:02:12 PM       Radiology No results found.  Procedures Procedures (including critical care time)  Medications Ordered in ED Medications - No data to display   Initial Impression / Assessment and Plan / ED Course  I have reviewed the triage vital signs and the nursing notes.  Pertinent labs & imaging results that were available during my care of the patient were reviewed by me and considered in my medical decision making (see chart for details).     Patient here for evaluation of lightheadedness/dizziness. She complains of mild headache, vision changes and nasal congestion. She is well appearing on examination with no focal neurologic deficits. Gait is steady and she does not complain of dizziness. She is noted to be hypertensive in the emergency department. She has taken her daily dose of medications. EKG does demonstrate new ST depression in the lateral leads when compared to priors. She does not have any chest  pain or difficulty breathing and troponin is negative. Her blood pressure and symptoms did improve following treatment with hydralazine as well as Reglan. CT head demonstrates a pituitary mass. Discussed with Dr. Cyndy Freeze with neurosurgery, recommends obtaining pituitary labs so with outpatient neurosurgical follow up.  Medicine consulted for observation for hypertensive urgency given patient's headache and the EKG changes with hypertension.  Final Clinical Impressions(s) / ED Diagnoses   Final diagnoses:  None    New Prescriptions New Prescriptions   No medications on file     Quintella Reichert, MD 11/02/16 [phone]

## 2016-11-01 NOTE — ED Notes (Signed)
ED Provider at bedside. 

## 2016-11-01 NOTE — ED Notes (Signed)
Up to BR, gait slight unsteady

## 2016-11-01 NOTE — Progress Notes (Signed)
Pt arrived on unit per [facility]. Pt alert and oriented. No complaints of pain at this time. [facility] Admissions paged.

## 2016-11-01 NOTE — H&P (Addendum)
History and Physical    Amanda Curtis IOE:[id number] DOB: 03/20/1938 DOA: 11/01/2016  PCP: Amanda Curtis, No Pcp Per  Amanda Curtis coming from: Home  I have personally briefly reviewed Amanda Curtis's old medical records in [place]  Chief Complaint: Dizziness  HPI: Amanda Curtis is a 81 y.o. female with medical history significant of HTN, hypothyroidism following RIA for Graves.  Amanda Curtis presents to the ED with c/o dizziness and lightheadedness.  Symptoms onset today while driving home from [place].  She went to pharmacy thinking it was related to BP and BP meds (she ran out of her chlorthalidone ~ 1 day ago and didn't take it today, did take her ARB this morning however).  BP at pharm was 182 systolic and Amanda Curtis was sent to the ED.   ED Course: SBP 190, improved to 110s after just 5mg  IV hydralazine.  CT head shows what appears to be a pitutary macroadenoma that is possibly compressing the optic chiasm.  Has had nasal congestion which has been improving over the past week.  Started on doxy but stopped the doxy several days ago after it caused stomach ache, back ache, etc.   Review of Systems: As per HPI otherwise 10 point review of systems negative.   Past Medical History:  Diagnosis Date  . Arthritis   . Diabetes mellitus without complication ([place])   . Graves disease 2000   s/p RIA  . Hypertension   . Insomnia   . Vitamin D deficiency     Past Surgical History:  Procedure Laterality Date  . APPENDECTOMY    . CORONARY ARTERY BYPASS GRAFT N/A 07/04/2013   Procedure: CORONARY ARTERY BYPASS GRAFTING (CABG) times four using left internal mammary artery and right saphenous leg vein.;  Surgeon: Gaye Pollack, MD;  Location: [facility];  Service: Open Heart Surgery;  Laterality: N/A;  . INTRAOPERATIVE TRANSESOPHAGEAL ECHOCARDIOGRAM N/A 07/04/2013   Procedure: INTRAOPERATIVE TRANSESOPHAGEAL ECHOCARDIOGRAM;  Surgeon: Gaye Pollack, MD;  Location: [facility];  Service: Open Heart Surgery;   Laterality: N/A;  . LEFT HEART CATHETERIZATION WITH CORONARY ANGIOGRAM N/A 06/30/2013   Procedure: LEFT HEART CATHETERIZATION WITH CORONARY ANGIOGRAM;  Surgeon: Blane Ohara, MD;  Location: [facility];  Service: Cardiovascular;  Laterality: N/A;  . NASAL SEPTUM SURGERY     childhood     reports that she has never smoked. She has never used smokeless tobacco. She reports that she does not drink alcohol or use drugs.  Allergies  Allergen Reactions  . Metformin Other (See Comments)  . Ampicillin Other (See Comments)    rash  . Atorvastatin Other (See Comments)    Severe muscle cramping Severe muscle cramping  . Codeine Other (See Comments)    dizziness dizziness  . Elavil [Amitriptyline] Other (See Comments)    Tongue swelling Tongue swelling  . Hctz [Hydrochlorothiazide] Other (See Comments)    Severe leg cramps Severe leg cramps Severe leg cramps  . Hytrin [Terazosin] Other (See Comments)    syncope  . Wasp Venom Other (See Comments)    Hand swelling Hand swelling  . Erythromycin Rash    Family History  Problem Relation Age of Onset  . Dementia Mother   . Cancer Mother        breast  . Diabetes Father   . Diabetes Paternal Grandmother   . Hypertension Paternal Grandmother      Prior to Admission medications   Medication Sig Start Date End Date Taking? Authorizing Provider  acetaminophen (TYLENOL) 325 MG  tablet Take 2 tablets (650 mg total) by mouth every 6 (six) hours as needed for mild pain. 07/09/13  Yes Collins, Arlyn Leak, PA-C  aspirin EC 81 MG tablet Take 81 mg by mouth daily.   Yes [provider]  chlorthalidone (HYGROTON) 25 MG tablet Take 1 tablet (25 mg total) by mouth daily. 09/26/16  Yes Sherren Mocha, MD  losartan (COZAAR) 100 MG tablet TAKE 1 TABLET BY MOUTH EVERY DAY Amanda Curtis taking differently: TAKE 50MG  BY MOUTH EVERY DAY 10/02/16  Yes Sherren Mocha, MD  pravastatin (PRAVACHOL) 40 MG tablet Take 1 tablet (40 mg total) by mouth every  evening. 03/31/16 11/01/16 Yes Sherren Mocha, MD  SYNTHROID 100 MCG tablet Take 1 tablet (100 mcg total) by mouth every morning. 10/28/15  Yes Debbrah Alar, NP  zolpidem (AMBIEN) 5 MG tablet Take 5 mg by mouth as needed for sleep. 09/14/16 09/14/17 Yes [provider]    Physical Exam: Vitals:   11/01/16 1800 11/01/16 1805 11/01/16 1913 11/01/16 2049  BP: (!) 152/84  (!) 103/57 114/69  Pulse: 85  79 75  Resp: 14  18 18   Temp:  98.6 F (37 C) 98 F (36.7 C) 98 F (36.7 C)  TempSrc:  Oral Oral Oral  SpO2: 100%  100% 95%  Weight:   76.5 kg (168 lb 11.2 oz)   Height:   5\' 6"  (1.676 m)     Constitutional: NAD, calm, comfortable Eyes: PERRL, lids and conjunctivae normal ENMT: Mucous membranes are moist. Posterior pharynx clear of any exudate or lesions.Normal dentition.  Neck: normal, supple, no masses, no thyromegaly Respiratory: clear to auscultation bilaterally, no wheezing, no crackles. Normal respiratory effort. No accessory muscle use.  Cardiovascular: Regular rate and rhythm, no murmurs / rubs / gallops. No extremity edema. 2+ pedal pulses. No carotid bruits.  Abdomen: no tenderness, no masses palpated. No hepatosplenomegaly. Bowel sounds positive.  Musculoskeletal: no clubbing / cyanosis. No joint deformity upper and lower extremities. Good ROM, no contractures. Normal muscle tone.  Skin: no rashes, lesions, ulcers. No induration Neurologic: CN 2-12 grossly intact. Sensation intact, DTR normal. Strength 5/5 in all 4.  Psychiatric: Normal judgment and insight. Alert and oriented x 3. Normal mood.    Labs on Admission: I have personally reviewed following labs and imaging studies  CBC:  Recent Labs Lab 11/01/16 1251  WBC 9.2  HGB 12.5  HCT 40.2  MCV 90.7  PLT 161*   Basic Metabolic Panel:  Recent Labs Lab 11/01/16 1251  NA 136  K 3.6  CL 103  CO2 26  GLUCOSE 118*  BUN 26*  CREATININE 1.32*  CALCIUM 8.7*   GFR: Estimated Creatinine Clearance:  37.9 mL/min (A) (by C-G formula based on SCr of 1.32 mg/dL (H)). Liver Function Tests: No results for input(s): AST, ALT, ALKPHOS, BILITOT, PROT, ALBUMIN in the last 168 hours. No results for input(s): LIPASE, AMYLASE in the last 168 hours. No results for input(s): AMMONIA in the last 168 hours. Coagulation Profile: No results for input(s): INR, PROTIME in the last 168 hours. Cardiac Enzymes:  Recent Labs Lab 11/01/16 1251  TROPONINI <0.03   BNP (last 3 results) No results for input(s): PROBNP in the last 8760 hours. HbA1C: No results for input(s): HGBA1C in the last 72 hours. CBG:  Recent Labs Lab 11/01/16 1255  GLUCAP 115*   Lipid Profile: No results for input(s): CHOL, HDL, LDLCALC, TRIG, CHOLHDL, LDLDIRECT in the last 72 hours. Thyroid Function Tests:  Recent Labs  11/01/16 1550  TSH 16.010*   Anemia Panel: No results for input(s): VITAMINB12, FOLATE, FERRITIN, TIBC, IRON, RETICCTPCT in the last 72 hours. Urine analysis:    Component Value Date/Time   COLORURINE YELLOW 11/01/2016 [place] 11/01/2016 1443   LABSPEC <1.005 (L) 11/01/2016 1443   PHURINE 7.0 11/01/2016 1443   GLUCOSEU NEGATIVE 11/01/2016 1443   GLUCOSEU NEGATIVE 11/30/2014 1534   HGBUR TRACE (A) 11/01/2016 1443   BILIRUBINUR NEGATIVE 11/01/2016 1443   BILIRUBINUR 1+ 05/31/2015 1206   KETONESUR NEGATIVE 11/01/2016 1443   PROTEINUR NEGATIVE 11/01/2016 1443   UROBILINOGEN negative 05/31/2015 1206   UROBILINOGEN 0.2 11/30/2014 1534   NITRITE NEGATIVE 11/01/2016 1443   LEUKOCYTESUR NEGATIVE 11/01/2016 1443    Radiological Exams on Admission: Ct Head Wo Contrast  Result Date: 11/01/2016 CLINICAL DATA:  Dizziness, weakness. EXAM: CT HEAD WITHOUT CONTRAST TECHNIQUE: Contiguous axial images were obtained from the base of the skull through the vertex without intravenous contrast. COMPARISON:  None. FINDINGS: Brain: Mild chronic ischemic white matter disease is noted. Calcification of  basal ganglia is noted bilaterally. 13 mm pituitary mass is noted which may represent macro adenoma or other possible neoplasm. Further evaluation with MRI is recommended. This does appear to make contact with the optic chiasm. Ventricular size is within normal limits. No midline shift is noted. No hemorrhage or acute infarction is noted. Vascular: No hyperdense vessel or unexpected calcification. Skull: Normal. Negative for fracture or focal lesion. Sinuses/Orbits: No acute finding. Other: None. IMPRESSION: 13 mm pituitary mass is noted which appears to make contact with the optic chiasm. This may represent pituitary macroadenoma, but other neoplasm cannot be excluded. Further evaluation with MRI is recommended. Electronically Signed   By: Marijo Conception, M.D.   On: 11/01/2016 13:57    EKG: Independently reviewed.  Assessment/Plan Principal Problem:   Dizziness and giddiness Active Problems:   Essential hypertension   Hypertensive urgency   Pituitary adenoma ([place])    1. Dizziness - improved with improvement in BP, HTN urgency preferred as cause. 2. HTN urgency - 1. Continue home BP meds 2. Monitor BP 3. Add PRN hydralazine if needed (though SBP 110s right now). 3. Pituitary adenoma - new finding 1. MRI brain ordered to get a better look at this 1. Getting CXR first to r/o presence of pacemaker, though Amanda Curtis denies having a pacemaker, has no scar, has no mention of pacemaker in recent cardiology office notes here at [facility]. 2. Pit panel ordered 1. Although could only order a 1 time urinary free cortisol, didn't see order option for 24hr urinary cortisol level 4. Hypothyroidism - 1. Continue synthroid (apparently missed a couple of days of this) 2. Checking T4  DVT prophylaxis: Lovenox Code Status: Full Family Communication: No family in room Disposition Plan: Home after admit Consults called: NS called by EDP, see Dr. Anson Fret note Admission status: Place in obs   [place], [facility] Pager [phone]  If 7AM-7PM, please contact day team taking care of Amanda Curtis [email or web address] Password [email or web address]  11/01/2016, 9:15 PM

## 2016-11-01 NOTE — ED Notes (Signed)
Patient transported to CT 

## 2016-11-01 NOTE — ED Notes (Signed)
Pt eating salting crackers, ok per RN Rod Holler

## 2016-11-01 NOTE — ED Notes (Signed)
ED Provider at bedside updating pt on plan of care for admission.

## 2016-11-01 NOTE — ED Notes (Signed)
Ambulated pt in hallway around nurses station.  Denied weakness or dizziness.

## 2016-11-01 NOTE — ED Notes (Signed)
Attempted to give report to floor nurse at [facility] but declined report at this time.

## 2016-11-01 NOTE — ED Notes (Signed)
Given frozen meal tray

## 2016-11-01 NOTE — ED Notes (Signed)
Report given to floor nurse at [facility] [place]

## 2016-11-02 DIAGNOSIS — R42 Dizziness and giddiness: Secondary | ICD-10-CM | POA: Diagnosis not present

## 2016-11-02 DIAGNOSIS — D352 Benign neoplasm of pituitary gland: Secondary | ICD-10-CM | POA: Diagnosis not present

## 2016-11-02 DIAGNOSIS — I10 Essential (primary) hypertension: Secondary | ICD-10-CM | POA: Diagnosis not present

## 2016-11-02 LAB — T4, FREE: FREE T4: 0.55 ng/dL — AB (ref 0.61–1.12)

## 2016-11-02 LAB — PROLACTIN: PROLACTIN: 20.5 ng/mL (ref 4.8–23.3)

## 2016-11-02 LAB — TROPONIN I: Troponin I: <0.03 ng/mL (ref <0.03)

## 2016-11-02 LAB — FOLLICLE STIMULATING HORMONE: FSH: 38.3 m[IU]/mL

## 2016-11-02 LAB — LUTEINIZING HORMONE: LH: 23.6 m[IU]/mL

## 2016-11-02 MED ORDER — LOSARTAN POTASSIUM 50 MG PO TABS: 50.0000 mg | ORAL_TABLET | Freq: Every day | ORAL | 0 refills | Status: DC

## 2016-11-02 MED ORDER — LOSARTAN POTASSIUM 50 MG PO TABS
50.0000 mg | ORAL_TABLET | Freq: Every day | ORAL | Status: DC
  Administered 2016-11-02: 50 mg via ORAL
  Filled 2016-11-02: qty 1

## 2016-11-02 MED ORDER — LEVOTHYROXINE SODIUM 112 MCG PO TABS: 112.0000 ug | ORAL_TABLET | Freq: Every day | ORAL | 0 refills | Status: DC

## 2016-11-02 MED ORDER — LEVOTHYROXINE SODIUM 112 MCG PO TABS
112.0000 ug | ORAL_TABLET | Freq: Every day | ORAL | Status: DC
  Administered 2016-11-02: 112 ug via ORAL
  Filled 2016-11-02: qty 1

## 2016-11-02 NOTE — Care Management Obs Status (Signed)
[place] NOTIFICATION   Patient Details  Name: Amanda Curtis MRN: [id number] Date of Birth: 03/20/1938   Medicare Observation Status Notification Given:  Yes    Pollie Friar, RN 11/02/2016, 11:13 AM

## 2016-11-02 NOTE — Discharge Instructions (Signed)
Pituitary Tumors Pituitary tumors are abnormal growths found in the pituitary gland. The pituitary gland is a small organ-about the size of a dime-located in the center of the brain. It makes hormones that affect growth and the functions of other glands in the body. Most pituitary tumors are benign. This means they are noncancerous. They grow slowly and do not spread to other parts of the body. A pituitary tumor may make the pituitary gland produce too many hormones. Tumors that make hormones are called functioning tumors (those that do not make hormones are called nonfunctioning tumors). Problems that can be caused by pituitary tumors include:  Cushing disease. This disease causes fat to build up in the face, back, and chest while the arms and legs become thin.  Acromegaly. This is a condition in which the hands, feet, and face are larger than normal.  Breast milk production even though there is no pregnancy.  What are the causes? The cause of most pituitary tumors is not known. In some cases, these kinds of tumors run in a family. What increases the risk? Some cases of pituitary tumors are due to genetic factors that a person inherits that increase the likelihood of developing certain tumors, including pituitary tumors. What are the signs or symptoms?  Headaches.  Vision problems.  Weakness or low energy.  Clear fluid draining from the nose.  Changes in the sense of smell.  Feeling sick to your stomach (nauseous) and vomiting.  Problems caused by the production of too many hormones, such as: ? Infertility. ? Loss of menstrual periods in women. ? Abnormal growth. ? High blood pressure (hypertension). ? Heat or cold intolerance. ? Other skin and body changes. ? Nipple discharge. ? Decreased sexual function. How is this diagnosed? If you develop symptoms, you will be sent for a CT scan or MRI to look for pituitary tumors. If you know that these kinds of tumors run in your family,  you may need to have your blood tested regularly to monitor pituitary hormone levels. How is this treated? These tumors are best treated when they are found and diagnosed early. Treatments include:  Surgical removal of the tumor. This is the most common treatment.  Radiation therapy. During this treatment, high doses of X-rays are used to kill tumor cells.  Drug therapy. This involves using certain medicines to block the pituitary gland from producing too many hormones.  Follow these instructions at home:  Drink plenty of fluids.  Measure your urine output if directed to do so by your health care provider.  Do not pick your nose or remove any crusting.  Do not do any activities that require straining.  Take all medicines as directed by your health care provider.  Keep follow-up appointments as directed by your health care provider. Contact a health care provider if:  You have sudden, unusual thirst.  You are urinating frequently.  You have a headache that will not go away.  You have new vision changes.  You notice clear fluid leaking from your nose or ears, a sensation of fluid trickling down the back of your throat, or a salty taste in your mouth.  You are having trouble concentrating. Get help right away if:  Your symptoms suddenly become severe.  You have a nosebleed that does not stop after a few minutes.  You have a fever over 101F (38.3C).  You have a severe headache or a stiff neck.  You are confused or not as alert as usual.  You   have chest pain or shortness of breath. This information is not intended to replace advice given to you by your health care provider. Make sure you discuss any questions you have with your health care provider. Document Released: 02/03/2002 Document Revised: 07/22/2015 Document Reviewed: 08/16/2012 Elsevier Interactive Patient Education  2018 [employer]  

## 2016-11-02 NOTE — Discharge Summary (Signed)
Physician Discharge Summary  Amanda Curtis WNU:[id number] DOB: 03/20/1938 DOA: 11/01/2016  PCP: Clerance Lav, PA-C  Admit date: 11/01/2016 Discharge date: 11/02/2016   Recommendations for Outpatient Follow-Up:   1. outpatient referral to neurosurgery for pituitary adenoma 2. Follow up on TSH   Discharge Diagnosis:   Principal Problem:   Dizziness and giddiness Active Problems:   Essential hypertension   Hypertensive urgency   Pituitary adenoma [facility])   Discharge disposition:  Home  Discharge Condition: Improved.  Diet recommendation: Low sodium, heart healthy  Wound care: None.   History of Present Illness:   Amanda Curtis is a 81 y.o. female with medical history significant of HTN, hypothyroidism following RIA for Graves.  Patient presents to the ED with c/o dizziness and lightheadedness.  Symptoms onset today while driving home from [place].  She went to pharmacy thinking it was related to BP and BP meds (she ran out of her chlorthalidone ~ 1 day ago and didn't take it today, did take her ARB this morning however).  BP at pharm was 034 systolic and patient was sent to the ED.   Hospital Course by Problem:   Dizziness  -resolved with improvement in BP  HTN urgency - Resumed home meds (takes 1/2 of 100 which does not have tab so gets a ? Amount of medication daily-- have given script for 50mg ) and patient improved to baseline  Pituitary adenoma - new finding ER spoke with Dr. Cyndy Freeze who recommended an outpatient follow up  Hypothyroidism - Continue synthroid at a higher dose -TSH- follow at home     Medical Consultants:    neurosugery   Discharge Exam:   Vitals:   11/02/16 0456 11/02/16 1050  BP: 128/73 138/67  Pulse: 70 73  Resp: 18 16  Temp: 98.2 F (36.8 C) 97.8 F (36.6 C)  SpO2: 96% 95%   Vitals:   11/01/16 2049 11/02/16 0128 11/02/16 0456 11/02/16 1050  BP: 114/69 (!) 113/57 128/73 138/67  Pulse: 75 71 70 73  Resp: 18 18  18 16   Temp: 98 F (36.7 C) 98 F (36.7 C) 98.2 F (36.8 C) 97.8 F (36.6 C)  TempSrc: Oral Oral Oral Oral  SpO2: 95% 99% 96% 95%  Weight:      Height:        Gen:  NAD- ambulating well    The results of significant diagnostics from this hospitalization (including imaging, microbiology, ancillary and laboratory) are listed below for reference.     Procedures and Diagnostic Studies:   Dg Chest 2 View  Result Date: 11/01/2016 CLINICAL DATA:  Pacemaker history EXAM: CHEST  2 VIEW COMPARISON:  01/13/2015 FINDINGS: No cardiac pacing device is visualized. Post sternotomy changes. Linear atelectasis or scar at the left base. Stable cardiomediastinal silhouette with atherosclerosis. No pneumothorax. Degenerative changes of the spine. IMPRESSION: No active cardiopulmonary disease. Electronically Signed   By: Donavan Foil M.D.   On: 11/01/2016 22:01   Ct Head Wo Contrast  Result Date: 11/01/2016 CLINICAL DATA:  Dizziness, weakness. EXAM: CT HEAD WITHOUT CONTRAST TECHNIQUE: Contiguous axial images were obtained from the base of the skull through the vertex without intravenous contrast. COMPARISON:  None. FINDINGS: Brain: Mild chronic ischemic white matter disease is noted. Calcification of basal ganglia is noted bilaterally. 13 mm pituitary mass is noted which may represent macro adenoma or other possible neoplasm. Further evaluation with MRI is recommended. This does appear to make contact with the optic chiasm. Ventricular size is within normal  limits. No midline shift is noted. No hemorrhage or acute infarction is noted. Vascular: No hyperdense vessel or unexpected calcification. Skull: Normal. Negative for fracture or focal lesion. Sinuses/Orbits: No acute finding. Other: None. IMPRESSION: 13 mm pituitary mass is noted which appears to make contact with the optic chiasm. This may represent pituitary macroadenoma, but other neoplasm cannot be excluded. Further evaluation with MRI is recommended.  Electronically Signed   By: Marijo Conception, M.D.   On: 11/01/2016 13:57   Mr Jeri Cos LO Contrast  Result Date: 11/01/2016 CLINICAL DATA:  81 y/o  F; dizziness and lightheadedness. EXAM: MRI HEAD WITHOUT AND WITH CONTRAST TECHNIQUE: Multiplanar, multiecho pulse sequences of the brain and surrounding structures were obtained without and with intravenous contrast. Pituitary protocol. CONTRAST:  77mL MULTIHANCE GADOBENATE DIMEGLUMINE 529 MG/ML IV SOLN COMPARISON:  11/01/2016 CT head. FINDINGS: Pituitary: Solid hypoenhancing mass centered within the pituitary fossa measuring 12 x 17 x 13 mm (AP x ML x CC series 18, image 8 and series 1604, image 3). The mass invades the left cavernous sinus with contact on left cavernous and paraclinoid internal carotid artery which demonstrates a normal flow void. There is a convex superior margin with partial effacement of the suprasellar cistern. The mass contacts the pre chiasmatic optic nerves near the optic chiasm with minimal superior displacement (series 15, image 9). The infundibulum is displaced rightward. Brain: No acute infarction, hemorrhage, hydrocephalus, extra-axial collection or mass lesion elsewhere in the brain parenchyma. Patchy nonspecific foci of T2 FLAIR hyperintense signal abnormality in subcortical and periventricular white matter is compatible with moderate chronic microvascular ischemic changes for age. Moderate brain parenchymal volume loss. No abnormal enhancement elsewhere in the brain. Vascular: Normal flow voids. Skull and upper cervical spine: Normal marrow signal. Sinuses/Orbits: Mild diffuse paranasal sinus mucosal thickening. No abnormal signal of mastoid air cells. Orbits are unremarkable. Other: None. IMPRESSION: 1. Solid well-circumscribed pituitary mass consistent with pituitary macroadenoma measuring up to 17 mm. The mass invades the left cavernous sinus and partially effaces the suprasellar cistern contacting the pre chiasmatic optic nerves  with minimal superior displacement. 2. Moderate chronic microvascular ischemic changes and mild parenchymal volume loss of the brain. 3. Mild diffuse paranasal sinus disease. Electronically Signed   By: Kristine Garbe M.D.   On: 11/01/2016 23:29     Labs:   Basic Metabolic Panel:  Recent Labs Lab 11/01/16 1251  NA 136  K 3.6  CL 103  CO2 26  GLUCOSE 118*  BUN 26*  CREATININE 1.32*  CALCIUM 8.7*   GFR Estimated Creatinine Clearance: 37.9 mL/min (A) (by C-G formula based on SCr of 1.32 mg/dL (H)). Liver Function Tests: No results for input(s): AST, ALT, ALKPHOS, BILITOT, PROT, ALBUMIN in the last 168 hours. No results for input(s): LIPASE, AMYLASE in the last 168 hours. No results for input(s): AMMONIA in the last 168 hours. Coagulation profile No results for input(s): INR, PROTIME in the last 168 hours.  CBC:  Recent Labs Lab 11/01/16 1251  WBC 9.2  HGB 12.5  HCT 40.2  MCV 90.7  PLT 465*   Cardiac Enzymes:  Recent Labs Lab 11/01/16 1251 11/01/16 1959 11/02/16 0114 11/02/16 0840  TROPONINI <0.03 <0.03 <0.03 <0.03   BNP: Invalid input(s): POCBNP CBG:  Recent Labs Lab 11/01/16 1255  GLUCAP 115*   D-Dimer No results for input(s): DDIMER in the last 72 hours. Hgb A1c No results for input(s): HGBA1C in the last 72 hours. Lipid Profile No results for input(s): CHOL, HDL,  LDLCALC, TRIG, CHOLHDL, LDLDIRECT in the last 72 hours. Thyroid function studies  Recent Labs  11/01/16 1550  TSH 16.010*   Anemia work up No results for input(s): VITAMINB12, FOLATE, FERRITIN, TIBC, IRON, RETICCTPCT in the last 72 hours. Microbiology No results found for this or any previous visit (from the past 240 hour(s)).   Discharge Instructions:   Discharge Instructions    Diet - low sodium heart healthy    Complete by:  As directed    Discharge instructions    Complete by:  As directed    Outpatient referral to neurosurgery for pituitary adenoma    Increase activity slowly    Complete by:  As directed      Allergies as of 11/02/2016      Reactions   Metformin Other (See Comments)   Ampicillin Other (See Comments)   rash   Atorvastatin Other (See Comments)   Severe muscle cramping Severe muscle cramping   Codeine Other (See Comments)   dizziness dizziness   Elavil [amitriptyline] Other (See Comments)   Tongue swelling Tongue swelling   Hctz [hydrochlorothiazide] Other (See Comments)   Severe leg cramps Severe leg cramps Severe leg cramps   Hytrin [terazosin] Other (See Comments)   syncope   Wasp Venom Other (See Comments)   Hand swelling Hand swelling   Erythromycin Rash      Medication List    TAKE these medications   acetaminophen 325 MG tablet Commonly known as:  TYLENOL Take 2 tablets (650 mg total) by mouth every 6 (six) hours as needed for mild pain.   aspirin EC 81 MG tablet Take 81 mg by mouth daily.   chlorthalidone 25 MG tablet Commonly known as:  HYGROTON Take 1 tablet (25 mg total) by mouth daily.   levothyroxine 112 MCG tablet Commonly known as:  SYNTHROID, LEVOTHROID Take 1 tablet (112 mcg total) by mouth daily before breakfast. What changed:  medication strength  how much to take  when to take this   losartan 50 MG tablet Commonly known as:  COZAAR Take 1 tablet (50 mg total) by mouth daily. What changed:  medication strength  See the new instructions.   pravastatin 40 MG tablet Commonly known as:  PRAVACHOL Take 1 tablet (40 mg total) by mouth every evening.   zolpidem 5 MG tablet Commonly known as:  AMBIEN Take 5 mg by mouth as needed for sleep.            Discharge Care Instructions        Start     Ordered   11/03/16 0000  losartan (COZAAR) 50 MG tablet  Daily     11/02/16 1133   11/03/16 0000  levothyroxine (SYNTHROID, LEVOTHROID) 112 MCG tablet  Daily before breakfast     11/02/16 1133   11/02/16 0000  Increase activity slowly     11/02/16 1133   11/02/16  0000  Diet - low sodium heart healthy     11/02/16 1133   11/02/16 0000  Discharge instructions    Comments:  Outpatient referral to neurosurgery for pituitary adenoma   11/02/16 1133     Follow-up Information    Clerance Lav, PA-C Follow up in 1 week(s).   Why:  Dr. Abbott Pao at [facility] information: [place] [address] [place] [address] [phone]            Time coordinating discharge: 25 min  Signed:  [place]   [facility] 11/02/2016, 11:33  AM

## 2016-11-02 NOTE — Progress Notes (Signed)
OOB IN HALLWAY, AMBULATING WITHOUT ASSISTANCE. NO GAIT DISTURBANCES NOTED, NO IMBALANCE. REPORTS NO DIZZINESS. ~300 FT.

## 2016-11-02 NOTE — Care Management Note (Addendum)
Case Management Note  Patient Details  Name: Amanda Curtis MRN: [id number] Date of Birth: 03/20/1938  Subjective/Objective:   Pt in with pituitary mass. She is from home alone.                 Action/Plan: Pt discharging home with self care. Pt has insurance, PCP, and transportation home. No further needs per CM.   Expected Discharge Date:  11/02/16               Expected Discharge Plan:  Home/Self Care  In-House Referral:     Discharge planning Services     Post Acute Care Choice:    Choice offered to:     DME Arranged:    DME Agency:     HH Arranged:    HH Agency:     Status of Service:  Completed, signed off  If discussed at [employer] of Stay Meetings, dates discussed:    Additional Comments:  Pollie Friar, RN 11/02/2016, 12:57 PM

## 2016-11-02 NOTE — Progress Notes (Signed)
I removed I.V. From patient's right anti cubital area at 13:50 11/02/2016. It was covered with gauze and tape and there was no bleeding from I.V. site

## 2016-11-03 LAB — INSULIN-LIKE GROWTH FACTOR: Somatomedin C: 104 ng/mL (ref 35–165)

## 2016-12-08 ENCOUNTER — Other Ambulatory Visit: Payer: Self-pay | Admitting: Cardiovascular Disease

## 2017-02-22 ENCOUNTER — Inpatient Hospital Stay (HOSPITAL_BASED_OUTPATIENT_CLINIC_OR_DEPARTMENT_OTHER)
Admission: EM | Admit: 2017-02-22 | Discharge: 2017-02-24 | DRG: 065 | Disposition: A | Discharge status: Home Health | Payer: Medicare Other | Attending: Family Medicine | Admitting: Family Medicine

## 2017-02-22 ENCOUNTER — Emergency Department (HOSPITAL_COMMUNITY): Payer: Medicare Other

## 2017-02-22 ENCOUNTER — Inpatient Hospital Stay (HOSPITAL_COMMUNITY): Payer: Medicare Other

## 2017-02-22 ENCOUNTER — Encounter (HOSPITAL_BASED_OUTPATIENT_CLINIC_OR_DEPARTMENT_OTHER): Payer: Self-pay

## 2017-02-22 ENCOUNTER — Other Ambulatory Visit: Payer: Self-pay

## 2017-02-22 DIAGNOSIS — R402142 Coma scale, eyes open, spontaneous, at arrival to emergency department: Secondary | ICD-10-CM | POA: Diagnosis present

## 2017-02-22 DIAGNOSIS — G4700 Insomnia, unspecified: Secondary | ICD-10-CM | POA: Diagnosis present

## 2017-02-22 DIAGNOSIS — E785 Hyperlipidemia, unspecified: Secondary | ICD-10-CM | POA: Diagnosis present

## 2017-02-22 DIAGNOSIS — R531 Weakness: Secondary | ICD-10-CM | POA: Diagnosis present

## 2017-02-22 DIAGNOSIS — R270 Ataxia, unspecified: Secondary | ICD-10-CM | POA: Diagnosis present

## 2017-02-22 DIAGNOSIS — I63132 Cerebral infarction due to embolism of left carotid artery: Secondary | ICD-10-CM | POA: Diagnosis not present

## 2017-02-22 DIAGNOSIS — I10 Essential (primary) hypertension: Secondary | ICD-10-CM | POA: Diagnosis not present

## 2017-02-22 DIAGNOSIS — I6789 Other cerebrovascular disease: Secondary | ICD-10-CM | POA: Diagnosis not present

## 2017-02-22 DIAGNOSIS — I63412 Cerebral infarction due to embolism of left middle cerebral artery: Principal | ICD-10-CM | POA: Diagnosis present

## 2017-02-22 DIAGNOSIS — D352 Benign neoplasm of pituitary gland: Secondary | ICD-10-CM

## 2017-02-22 DIAGNOSIS — E890 Postprocedural hypothyroidism: Secondary | ICD-10-CM | POA: Diagnosis present

## 2017-02-22 DIAGNOSIS — K219 Gastro-esophageal reflux disease without esophagitis: Secondary | ICD-10-CM | POA: Diagnosis present

## 2017-02-22 DIAGNOSIS — R29701 NIHSS score 1: Secondary | ICD-10-CM | POA: Diagnosis present

## 2017-02-22 DIAGNOSIS — I639 Cerebral infarction, unspecified: Secondary | ICD-10-CM | POA: Diagnosis not present

## 2017-02-22 DIAGNOSIS — R402252 Coma scale, best verbal response, oriented, at arrival to emergency department: Secondary | ICD-10-CM | POA: Diagnosis present

## 2017-02-22 DIAGNOSIS — Z951 Presence of aortocoronary bypass graft: Secondary | ICD-10-CM

## 2017-02-22 DIAGNOSIS — I672 Cerebral atherosclerosis: Secondary | ICD-10-CM | POA: Diagnosis present

## 2017-02-22 DIAGNOSIS — I129 Hypertensive chronic kidney disease with stage 1 through stage 4 chronic kidney disease, or unspecified chronic kidney disease: Secondary | ICD-10-CM | POA: Diagnosis present

## 2017-02-22 DIAGNOSIS — E1129 Type 2 diabetes mellitus with other diabetic kidney complication: Secondary | ICD-10-CM | POA: Diagnosis not present

## 2017-02-22 DIAGNOSIS — I2510 Atherosclerotic heart disease of native coronary artery without angina pectoris: Secondary | ICD-10-CM | POA: Diagnosis present

## 2017-02-22 DIAGNOSIS — M7989 Other specified soft tissue disorders: Secondary | ICD-10-CM | POA: Diagnosis present

## 2017-02-22 DIAGNOSIS — N183 Chronic kidney disease, stage 3 unspecified: Secondary | ICD-10-CM | POA: Diagnosis present

## 2017-02-22 DIAGNOSIS — Z7982 Long term (current) use of aspirin: Secondary | ICD-10-CM

## 2017-02-22 DIAGNOSIS — M6289 Other specified disorders of muscle: Secondary | ICD-10-CM | POA: Diagnosis not present

## 2017-02-22 DIAGNOSIS — M79606 Pain in leg, unspecified: Secondary | ICD-10-CM | POA: Diagnosis present

## 2017-02-22 DIAGNOSIS — R402362 Coma scale, best motor response, obeys commands, at arrival to emergency department: Secondary | ICD-10-CM | POA: Diagnosis present

## 2017-02-22 DIAGNOSIS — E1122 Type 2 diabetes mellitus with diabetic chronic kidney disease: Secondary | ICD-10-CM | POA: Diagnosis present

## 2017-02-22 DIAGNOSIS — Z91030 Bee allergy status: Secondary | ICD-10-CM

## 2017-02-22 DIAGNOSIS — E039 Hypothyroidism, unspecified: Secondary | ICD-10-CM | POA: Diagnosis present

## 2017-02-22 DIAGNOSIS — Z8673 Personal history of transient ischemic attack (TIA), and cerebral infarction without residual deficits: Secondary | ICD-10-CM | POA: Diagnosis not present

## 2017-02-22 DIAGNOSIS — G8191 Hemiplegia, unspecified affecting right dominant side: Secondary | ICD-10-CM | POA: Diagnosis present

## 2017-02-22 DIAGNOSIS — Z8249 Family history of ischemic heart disease and other diseases of the circulatory system: Secondary | ICD-10-CM | POA: Diagnosis not present

## 2017-02-22 DIAGNOSIS — Z79890 Hormone replacement therapy (postmenopausal): Secondary | ICD-10-CM | POA: Diagnosis not present

## 2017-02-22 DIAGNOSIS — Z888 Allergy status to other drugs, medicaments and biological substances status: Secondary | ICD-10-CM

## 2017-02-22 DIAGNOSIS — Z881 Allergy status to other antibiotic agents status: Secondary | ICD-10-CM

## 2017-02-22 DIAGNOSIS — Z885 Allergy status to narcotic agent status: Secondary | ICD-10-CM

## 2017-02-22 HISTORY — DX: Neoplasm of unspecified behavior of endocrine glands and other parts of nervous system: D49.7

## 2017-02-22 LAB — ETHANOL: Alcohol, Ethyl (B): <10 mg/dL (ref <10)

## 2017-02-22 LAB — CBC WITH DIFFERENTIAL/PLATELET
Basophils Absolute: 0.1 10*3/uL (ref 0.0–0.1)
Basophils Relative: 1 %
Eosinophils Absolute: 0.1 10*3/uL (ref 0.0–0.7)
Eosinophils Relative: 1 %
HCT: 40.2 % (ref 36.0–46.0)
Hemoglobin: 12.5 g/dL (ref 12.0–15.0)
Lymphocytes Relative: 21 %
Lymphs Abs: 2 10*3/uL (ref 0.7–4.0)
MCH: 27.9 pg (ref 26.0–34.0)
MCHC: 31.1 g/dL (ref 30.0–36.0)
MCV: 89.7 fL (ref 78.0–100.0)
Monocytes Absolute: 0.8 10*3/uL (ref 0.1–1.0)
Monocytes Relative: 9 %
Neutro Abs: 6.4 10*3/uL (ref 1.7–7.7)
Neutrophils Relative %: 68 %
Platelets: 490 10*3/uL — ABNORMAL HIGH (ref 150–400)
RBC: 4.48 MIL/uL (ref 3.87–5.11)
RDW: 15.3 % (ref 11.5–15.5)
WBC: 9.4 10*3/uL (ref 4.0–10.5)

## 2017-02-22 LAB — BASIC METABOLIC PANEL
Anion gap: 11 (ref 5–15)
BUN: 30 mg/dL — ABNORMAL HIGH (ref 6–20)
CO2: 23 mmol/L (ref 22–32)
Calcium: 9.5 mg/dL (ref 8.9–10.3)
Chloride: 103 mmol/L (ref 101–111)
Creatinine, Ser: 1.43 mg/dL — ABNORMAL HIGH (ref 0.44–1.00)
GFR calc Af Amer: 39 mL/min — ABNORMAL LOW (ref >60)
GFR calc non Af Amer: 34 mL/min — ABNORMAL LOW (ref >60)
Glucose, Bld: 120 mg/dL — ABNORMAL HIGH (ref 65–99)
Potassium: 4.2 mmol/L (ref 3.5–5.1)
Sodium: 137 mmol/L (ref 135–145)

## 2017-02-22 LAB — I-STAT CHEM 8, ED
BUN: 24 mg/dL — AB (ref 6–20)
CALCIUM ION: 1.14 mmol/L — AB (ref 1.15–1.40)
CHLORIDE: 108 mmol/L (ref 101–111)
CREATININE: 1.1 mg/dL — AB (ref 0.44–1.00)
Glucose, Bld: 103 mg/dL — ABNORMAL HIGH (ref 65–99)
HCT: 36 % (ref 36.0–46.0)
Hemoglobin: 12.2 g/dL (ref 12.0–15.0)
Potassium: 3.6 mmol/L (ref 3.5–5.1)
Sodium: 141 mmol/L (ref 135–145)
TCO2: 21 mmol/L — AB (ref 22–32)

## 2017-02-22 LAB — RAPID URINE DRUG SCREEN, HOSP PERFORMED
AMPHETAMINES: NOT DETECTED
BARBITURATES: NOT DETECTED
BENZODIAZEPINES: NOT DETECTED
COCAINE: NOT DETECTED
Opiates: NOT DETECTED
TETRAHYDROCANNABINOL: NOT DETECTED

## 2017-02-22 LAB — GLUCOSE, CAPILLARY: Glucose-Capillary: 92 mg/dL (ref 65–99)

## 2017-02-22 LAB — PROTIME-INR
INR: 0.97
Prothrombin Time: 12.8 seconds (ref 11.4–15.2)

## 2017-02-22 LAB — CBG MONITORING, ED: Glucose-Capillary: 121 mg/dL — ABNORMAL HIGH (ref 65–99)

## 2017-02-22 MED ORDER — IOPAMIDOL (ISOVUE-370) INJECTION 76%
INTRAVENOUS | Status: AC
  Administered 2017-02-22: 50 mL
  Filled 2017-02-22: qty 50

## 2017-02-22 MED ORDER — ASPIRIN 81 MG PO CHEW
324.0000 mg | CHEWABLE_TABLET | Freq: Once | ORAL | Status: AC
  Administered 2017-02-22: 324 mg via ORAL
  Filled 2017-02-22: qty 4

## 2017-02-22 MED ORDER — IOPAMIDOL (ISOVUE-370) INJECTION 76%
INTRAVENOUS | Status: AC
  Filled 2017-02-22: qty 100

## 2017-02-22 MED ORDER — SODIUM CHLORIDE 0.9 % IV BOLUS (SEPSIS)
1000.0000 mL | Freq: Once | INTRAVENOUS | Status: AC
  Administered 2017-02-22: 1000 mL via INTRAVENOUS

## 2017-02-22 NOTE — ED Notes (Signed)
Per EDP, can hold off on urine specimen.

## 2017-02-22 NOTE — ED Notes (Signed)
On way to CT 

## 2017-02-22 NOTE — ED Provider Notes (Signed)
[facility] EMERGENCY DEPARTMENT Provider Note   CSN: [id number] Arrival date & time: 02/22/17  1344     History   Chief Complaint Chief Complaint  Patient presents with  . Fall  . Numbness    Right Arm    HPI Amanda Curtis is a 81 y.o. female.  HPI   81 year old female with right-sided weakness.  Symptom onset at 9 PM last night.  Patient began having "heaviness in her legs" and felt like she was going to fall.  She was actually able to sit herself down and did not fall.  She has had persistent numbness and weakness in her right arm and leg since then.  Recently diagnosed pituitary adenoma.  She felt her symptoms might be related to this so she delayed treatment until today when they persisted.  No acute headaches.  No acute visual changes.  She is not anticoagulated.  Past Medical History:  Diagnosis Date  . Arthritis   . Diabetes mellitus without complication ([place])   . Graves disease 2000   s/p RIA  . Hypertension   . Insomnia   . Pituitary tumor   . Vitamin D deficiency     Patient Active Problem List   Diagnosis Date Noted  . Dizziness and giddiness 11/01/2016  . Hypertensive urgency 11/01/2016  . Pituitary adenoma ([place]) 11/01/2016  . Vitamin D deficiency 10/27/2015  . Vulvar atrophy 02/03/2015  . Allergy to bee sting 11/30/2014  . Dysuria 12/23/2013  . Diabetic peripheral neuropathy ([place]) 09/22/2013  . Carotid stenosis 09/08/2013  . Hyperlipidemia 07/25/2013  . Type 2 diabetes with peripheral circulatory disorder, controlled ([place]) 07/12/2013  . Unspecified constipation 07/12/2013  . S/P CABG x 4 07/04/2013  . Coronary artery disease 05/23/2013  . Anxiety 04/30/2013  . Routine general medical examination at a health care facility 03/25/2013  . Nonspecific abnormal electrocardiogram (ECG) (EKG) 03/25/2013  . Essential hypertension 02/07/2013  . Insomnia 02/07/2013  . Hypothyroidism 02/07/2013    Past Surgical History:  Procedure Laterality  Date  . APPENDECTOMY    . CORONARY ARTERY BYPASS GRAFT N/A 07/04/2013   Procedure: CORONARY ARTERY BYPASS GRAFTING (CABG) times four using left internal mammary artery and right saphenous leg vein.;  Surgeon: Gaye Pollack, MD;  Location: [facility] OR;  Service: Open Heart Surgery;  Laterality: N/A;  . INTRAOPERATIVE TRANSESOPHAGEAL ECHOCARDIOGRAM N/A 07/04/2013   Procedure: INTRAOPERATIVE TRANSESOPHAGEAL ECHOCARDIOGRAM;  Surgeon: Gaye Pollack, MD;  Location: [facility] OR;  Service: Open Heart Surgery;  Laterality: N/A;  . LEFT HEART CATHETERIZATION WITH CORONARY ANGIOGRAM N/A 06/30/2013   Procedure: LEFT HEART CATHETERIZATION WITH CORONARY ANGIOGRAM;  Surgeon: Blane Ohara, MD;  [facility];  Service: Cardiovascular;  Laterality: N/A;  . NASAL SEPTUM SURGERY     childhood    OB History    Gravida Para Term Preterm AB Living   1 1           SAB TAB Ectopic Multiple Live Births           1       Home Medications    Prior to Admission medications   Medication Sig Start Date End Date Taking? Authorizing Provider  acetaminophen (TYLENOL) 325 MG tablet Take 2 tablets (650 mg total) by mouth every 6 (six) hours as needed for mild pain. 07/09/13   Coolidge Breeze, PA-C  aspirin EC 81 MG tablet Take 81 mg by mouth daily.    [provider]  chlorthalidone (HYGROTON) 25 MG  tablet Take 1 tablet (25 mg total) by mouth daily. 09/26/16   Sherren Mocha, MD  levothyroxine (SYNTHROID, LEVOTHROID) 112 MCG tablet Take 1 tablet (112 mcg total) by mouth daily before breakfast. 11/03/16   Geradine Girt, DO  losartan (COZAAR) 50 MG tablet TAKE 1 TABLET BY MOUTH DAILY 12/08/16   Sherren Mocha, MD  pravastatin (PRAVACHOL) 40 MG tablet Take 1 tablet (40 mg total) by mouth every evening. 03/31/16 11/01/16  Sherren Mocha, MD  zolpidem (AMBIEN) 5 MG tablet Take 5 mg by mouth as needed for sleep. 09/14/16 09/14/17  [provider]    Family History Family History  Problem Relation Age of Onset    . Dementia Mother   . Cancer Mother        breast  . Diabetes Father   . Diabetes Paternal Grandmother   . Hypertension Paternal Grandmother     Social History Social History   Tobacco Use  . Smoking status: Never Smoker  . Smokeless tobacco: Never Used  Substance Use Topics  . Alcohol use: No  . Drug use: No     Allergies   Metformin; Ampicillin; Atorvastatin; Codeine; Elavil [amitriptyline]; Hctz [hydrochlorothiazide]; Hytrin [terazosin]; Wasp venom; and Erythromycin   Review of Systems Review of Systems  All systems reviewed and negative, other than as noted in HPI.  Physical Exam Updated Vital Signs BP (!) 168/97 (BP Location: Right Arm)   Pulse 67   Temp 98 F (36.7 C) (Oral)   Resp 18   Ht 5\' 5"  (1.651 m)   Wt 72.8 kg (160 lb 7.9 oz)   SpO2 95%   BMI 26.71 kg/m   Physical Exam  Constitutional: She appears well-developed and well-nourished. No distress.  HENT:  Head: Normocephalic and atraumatic.  Eyes: Conjunctivae are normal. Right eye exhibits no discharge. Left eye exhibits no discharge.  Neck: Neck supple.  Cardiovascular: Normal rate, regular rhythm and normal heart sounds. Exam reveals no gallop and no friction rub.  No murmur heard. Pulmonary/Chest: Effort normal and breath sounds normal. No respiratory distress.  Abdominal: Soft. She exhibits no distension. There is no tenderness.  Musculoskeletal: She exhibits no edema or tenderness.  Neurological: She is alert.  Mild dysarthria, but understandable.  Alert and oriented x3.  Cranial nerves II through XII appear to be intact.  Strength is 2 out of 5 right upper extremity.  4 out of 5 right lower extremity.  5 out of 5 L u/l ext.  Skin: Skin is warm and dry.  Psychiatric: She has a normal mood and affect. Her behavior is normal. Thought content normal.  Nursing note and vitals reviewed.    ED Treatments / Results  Labs (all labs ordered are listed, but only abnormal results are  displayed) Labs Reviewed  CBC WITH DIFFERENTIAL/PLATELET - Abnormal; Notable for the following components:      Result Value   Platelets 490 (*)    All other components within normal limits  BASIC METABOLIC PANEL - Abnormal; Notable for the following components:   Glucose, Bld 120 (*)    BUN 30 (*)    Creatinine, Ser 1.43 (*)    GFR calc non Af Amer 34 (*)    GFR calc Af Amer 39 (*)    All other components within normal limits  CBG MONITORING, ED - Abnormal; Notable for the following components:   Glucose-Capillary 121 (*)    All other components within normal limits  ETHANOL  PROTIME-INR  RAPID URINE DRUG SCREEN,  HOSP PERFORMED  URINALYSIS, ROUTINE W REFLEX MICROSCOPIC    EKG  EKG Interpretation  Date/Time:  Thursday February 22 2017 14:19:34 EST Ventricular Rate:  74 PR Interval:    QRS Duration: 94 QT Interval:  416 QTC Calculation: 462 R Axis:   -42 Text Interpretation:  Sinus rhythm Consider left atrial enlargement Left axis deviation Low voltage, precordial leads No old tracing to compare Confirmed by Virgel Manifold [phone]) on 02/22/2017 4:20:10 PM       Radiology No results found.  Procedures Procedures (including critical care time)  Medications Ordered in ED Medications - No data to display   Initial Impression / Assessment and Plan / ED Course  I have reviewed the triage vital signs and the nursing notes.  Pertinent labs & imaging results that were available during my care of the patient were reviewed by me and considered in my medical decision making (see chart for details).      25y female with right-sided weakness.  Symptom onset at 9 PM yesterday.  Clinically she had a stroke.  Unfortunately, cannot obtain a CT scan at this time due to repairs.  Will transfer to [facility] in emergency room for imaging.  She will ultimately require admission.  Final Clinical Impressions(s) / ED Diagnoses   Final diagnoses:  Acute right-sided weakness    ED  Discharge Orders    None       Virgel Manifold, MD 02/22/17 1620

## 2017-02-22 NOTE — H&P (Signed)
History and Physical    [name] LPF:[id number] DOB: September 16, 1935 DOA: 02/22/2017  Referring MD/NP/PA:   PCP: Clerance Lav, PA-C   Patient coming from:  The patient is coming from home.  At baseline, pt is independent for most of ADL.   Chief Complaint: Right-sided weakness and numbness  HPI: Amanda Curtis is a 81 y.o. female with medical history significant of hypertension, hyperlipidemia, diet-controlled diabetes, hypothyroidism, pituitary tumor, insomnia, Graves' disease, hypothyroidism, CAD, CABG, CKd-3, who presents with right-sided weakness and numbness.  Patient states that her symptoms started at about 9 PM last night. She has weakness and numbness in right arm and leg. No vision change, hearing loss, facial droop, slurred speech. Her symptoms have improved, but still has arm and leg heaviness and numbness. Patient denies fall to me. Patient does not have chest pain, sob, cough, fever or chills. No nausea, vomiting, diarrhea, abdominal pain. She has burning on urination sometimes.  ED Course: pt was found to have  WBC 9.4, alcohol level less than 10, stable renal function, temperature normal, no tachycardia, O2 sat 95% on room air. Pt is admitted to telemetry bed as inpatient. Neurology, Dr. Leonel Ramsay was consulted.  MRI HEAD showed: 1. Multiple subcentimeter infarcts LEFT MCA territory seen with embolic phenomena or, carotid artery stenosis. 2. Moderate to severe chronic small vessel ischemic disease and old lacunar infarcts. 3. Stable appearance of pituitary macroadenoma.  MRA HEAD showed: 1. No emergent large vessel occlusion. 2. Slow flow LEFT vertebral artery suggesting proximal stenosis or occlusion with reconstitution. 3. Mild atherosclerosis.  Review of Systems:   General: no fevers, chills, no body weight gain, has fatigue HEENT: no blurry vision, hearing changes or sore throat Respiratory: no dyspnea, coughing, wheezing CV: no chest pain, no  palpitations GI: no nausea, vomiting, abdominal pain, diarrhea, constipation GU: no dysuria, burning on urination, increased urinary frequency, hematuria  Ext: no leg edema Neuro: has right sided weakness, numbness,  no vision change or hearing loss Skin: no rash, no skin tear. MSK: No muscle spasm, no deformity, no limitation of range of movement in spin Heme: No easy bruising.  Travel history: No recent long distant travel.  Allergy:  Allergies  Allergen Reactions  . Metformin Other (See Comments)  . Ampicillin Other (See Comments)    rash  . Atorvastatin Other (See Comments)    Severe muscle cramping Severe muscle cramping  . Codeine Other (See Comments)    dizziness dizziness  . Elavil [Amitriptyline] Other (See Comments)    Tongue swelling Tongue swelling  . Hctz [Hydrochlorothiazide] Other (See Comments)    Severe leg cramps Severe leg cramps Severe leg cramps  . Hytrin [Terazosin] Other (See Comments)    syncope  . Melatonin Other (See Comments)    Headaches  . Trazodone Other (See Comments)  . Wasp Venom Other (See Comments)    Hand swelling Hand swelling  . Erythromycin Rash    Past Medical History:  Diagnosis Date  . Arthritis   . Diabetes mellitus without complication ([place])   . Graves disease 2000   s/p RIA  . Hypertension   . Insomnia   . Pituitary tumor   . Vitamin D deficiency     Past Surgical History:  Procedure Laterality Date  . APPENDECTOMY    . CORONARY ARTERY BYPASS GRAFT N/A 07/04/2013   Procedure: CORONARY ARTERY BYPASS GRAFTING (CABG) times four using left internal mammary artery and right saphenous leg vein.;  Surgeon: Gaye Pollack, MD;  Location: [facility] OR;  Service: Open Heart Surgery;  Laterality: N/A;  . INTRAOPERATIVE TRANSESOPHAGEAL ECHOCARDIOGRAM N/A 07/04/2013   Procedure: INTRAOPERATIVE TRANSESOPHAGEAL ECHOCARDIOGRAM;  Surgeon: Gaye Pollack, MD;  Location: [facility] OR;  Service: Open Heart Surgery;  Laterality: N/A;  . LEFT HEART  CATHETERIZATION WITH CORONARY ANGIOGRAM N/A 06/30/2013   Procedure: LEFT HEART CATHETERIZATION WITH CORONARY ANGIOGRAM;  Surgeon: Blane Ohara, MD;  Location: [facility] CATH LAB;  Service: Cardiovascular;  Laterality: N/A;  . NASAL SEPTUM SURGERY     childhood    Social History:  reports that  has never smoked. she has never used smokeless tobacco. She reports that she does not drink alcohol or use drugs.  Family History:  Family History  Problem Relation Age of Onset  . Dementia Mother   . Cancer Mother        breast  . Diabetes Father   . Diabetes Paternal Grandmother   . Hypertension Paternal Grandmother      Prior to Admission medications   Medication Sig Start Date End Date Taking? Authorizing Provider  acetaminophen (TYLENOL) 325 MG tablet Take 2 tablets (650 mg total) by mouth every 6 (six) hours as needed for mild pain. 07/09/13  Yes Collins, Arlyn Leak, PA-C  aspirin EC 81 MG tablet Take 81 mg by mouth daily.   Yes [provider]  chlorthalidone (HYGROTON) 25 MG tablet Take 1 tablet (25 mg total) by mouth daily. 09/26/16  Yes Sherren Mocha, MD  levothyroxine (SYNTHROID, LEVOTHROID) 112 MCG tablet Take 1 tablet (112 mcg total) by mouth daily before breakfast. 11/03/16  Yes Vann, Jessica U, DO  losartan (COZAAR) 50 MG tablet TAKE 1 TABLET BY MOUTH DAILY Patient taking differently: TAKE 50 mg TABLET BY MOUTH DAILY 12/08/16  Yes Sherren Mocha, MD  pravastatin (PRAVACHOL) 40 MG tablet Take 1 tablet (40 mg total) by mouth every evening. Patient taking differently: Take 40 mg by mouth daily.  03/31/16 02/22/17 Yes Sherren Mocha, MD  zolpidem (AMBIEN) 5 MG tablet Take 5 mg by mouth as needed for sleep. 09/14/16 09/14/17  [provider]    Physical Exam: Vitals:   02/23/17 0020 02/23/17 0220 02/23/17 0420 02/23/17 0500  BP: (!) 146/64 121/65 (!) 178/66   Pulse:      Resp: 18 20 20    Temp: 98.1 F (36.7 C) 98.1 F (36.7 C) 98.1 F (36.7 C)   TempSrc: Oral Oral Oral     SpO2: 98% 97% 99%   Weight:    73.9 kg (162 lb 14.7 oz)  Height:       General: Not in acute distress HEENT:       Eyes: PERRL, EOMI, no scleral icterus.       ENT: No discharge from the ears and nose, no pharynx injection, no tonsillar enlargement.        Neck: No JVD, no bruit, no mass felt. Heme: No neck lymph node enlargement. Cardiac: S1/S2, RRR, No murmurs, No gallops or rubs. Respiratory: No rales, wheezing, rhonchi or rubs. GI: Soft, nondistended, nontender, no rebound pain, no organomegaly, BS present. GU: No hematuria Ext: No pitting leg edema bilaterally. 2+DP/PT pulse bilaterally. Musculoskeletal: No joint deformities, No joint redness or warmth, no limitation of ROM in spin. Skin: No rashes.  Neuro: Alert, oriented X3, cranial nerves II-XII grossly intact, moves all extremities normally. Muscle strength 4/5 in right arm and leg; 5/5 in right extremities, sensation to light touch decreased in right side. Brachial reflex 2+ bilaterally. Knee reflex 1+ bilaterally.  Negative Babinski's sign. Psych: Patient is not psychotic, no suicidal or hemocidal ideation.  Labs on Admission: I have personally reviewed following labs and imaging studies  CBC: Recent Labs  Lab 02/22/17 1426 02/22/17 2111  WBC 9.4  --   NEUTROABS 6.4  --   HGB 12.5 12.2  HCT 40.2 36.0  MCV 89.7  --   PLT 490*  --    Basic Metabolic Panel: Recent Labs  Lab 02/22/17 1426 02/22/17 2111  NA 137 141  K 4.2 3.6  CL 103 108  CO2 23  --   GLUCOSE 120* 103*  BUN 30* 24*  CREATININE 1.43* 1.10*  CALCIUM 9.5  --    GFR: Estimated Creatinine Clearance: 40.9 mL/min (A) (by C-G formula based on SCr of 1.1 mg/dL (H)). Liver Function Tests: No results for input(s): AST, ALT, ALKPHOS, BILITOT, PROT, ALBUMIN in the last 168 hours. No results for input(s): LIPASE, AMYLASE in the last 168 hours. No results for input(s): AMMONIA in the last 168 hours. Coagulation Profile: Recent Labs  Lab 02/22/17 1426   INR 0.97   Cardiac Enzymes: Recent Labs  Lab 02/23/17 0307  CKTOTAL 66   BNP (last 3 results) No results for input(s): PROBNP in the last 8760 hours. HbA1C: Recent Labs    02/23/17 0307  HGBA1C 7.4*   CBG: Recent Labs  Lab 02/22/17 1411 02/22/17 2239  GLUCAP 121* 92   Lipid Profile: Recent Labs    02/23/17 0307  CHOL 226*  HDL 31*  LDLCALC 160*  TRIG 176*  CHOLHDL 7.3   Thyroid Function Tests: Recent Labs    02/23/17 0307  TSH 0.431  FREET4 1.02   Anemia Panel: No results for input(s): VITAMINB12, FOLATE, FERRITIN, TIBC, IRON, RETICCTPCT in the last 72 hours. Urine analysis:    Component Value Date/Time   COLORURINE YELLOW 11/01/2016 [place] 11/01/2016 1443   LABSPEC <1.005 (L) 11/01/2016 1443   PHURINE 7.0 11/01/2016 1443   GLUCOSEU NEGATIVE 11/01/2016 1443   GLUCOSEU NEGATIVE 11/30/2014 1534   HGBUR TRACE (A) 11/01/2016 1443   BILIRUBINUR NEGATIVE 11/01/2016 1443   BILIRUBINUR 1+ 05/31/2015 1206   KETONESUR NEGATIVE 11/01/2016 1443   PROTEINUR NEGATIVE 11/01/2016 1443   UROBILINOGEN negative 05/31/2015 1206   UROBILINOGEN 0.2 11/30/2014 1534   NITRITE NEGATIVE 11/01/2016 1443   LEUKOCYTESUR NEGATIVE 11/01/2016 1443   Sepsis Labs: @LABRCNTIP (procalcitonin:4,lacticidven:4) )No results found for this or any previous visit (from the past 240 hour(s)).   Radiological Exams on Admission: Ct Angio Head W Or Wo Contrast  Result Date: 02/22/2017 CLINICAL DATA:  Follow-up acute LEFT MCA territory infarcts. History of pituitary macroadenoma, diabetes and Grave's disease. EXAM: CT ANGIOGRAPHY HEAD AND NECK TECHNIQUE: Multidetector CT imaging of the head and neck was performed using the standard protocol during bolus administration of intravenous contrast. Multiplanar CT image reconstructions and MIPs were obtained to evaluate the vascular anatomy. Carotid stenosis measurements (when applicable) are obtained utilizing NASCET criteria,  using the distal internal carotid diameter as the denominator. CONTRAST:  41mL ISOVUE-370 IOPAMIDOL (ISOVUE-370) INJECTION 76% COMPARISON:  MRI/MRA head February 22, 2017 at 2023 hours FINDINGS: CT HEAD: BRAIN: No intraparenchymal hemorrhage, mass effect nor midline shift. The ventricles and sulci are normal for age. Patchy supratentorial white matter hypodensities within normal range for patient's age, though non-specific are most compatible with chronic small vessel ischemic disease. No acute large vascular territory infarcts. No abnormal extra-axial fluid collections. Basal cisterns are patent. VASCULAR: Moderate calcific atherosclerosis of the carotid  siphons. SKULL: No skull fracture. No significant scalp soft tissue swelling. SINUSES/ORBITS: The mastoid air-cells and included paranasal sinuses are well-aerated.The included ocular globes and orbital contents are non-suspicious. OTHER: None. CTA NECK AORTIC ARCH: Normal 3.6 cm ascending aorta, 2 vessel arch is a normal variant. Moderate intimal thickening calcific atherosclerosis aortic arch. Moderate stenosis LEFT subclavian artery origin. Severe stenosis mid LEFT subclavian artery. Mild stenosis RIGHT subclavian artery origin. RIGHT CAROTID SYSTEM: Common carotid artery is widely patent, mildly tortuous course and intimal thickening. Calcific atherosclerosis resulting an 4 mm segment critical stenosis RIGHT ICA origin. 3 mm segment 60% stenosis RIGHT internal carotid artery 1 cm from the origin. LEFT CAROTID SYSTEM: Common carotid artery is widely patent, coursing in a straight line fashion. 5 mm segment LEFT ICA origin 50% stenosis by NASCET criteria. Cervical internal carotid artery is patent. VERTEBRAL ARTERIES:RIGHT vertebral artery is dominant. Calcific atherosclerosis resulting in mild stenosis RIGHT vertebral artery origin. Severe stenosis LEFT vertebral artery origin. Gradual decreased contrast opacification than LEFT P2 segment, occluded LEFT V3  segment and V4 segment with distal reconstitution. SKELETON: No acute osseous process though bone windows have not been submitted. Advanced degenerative changes cervical spine. Moderate to severe LEFT C3-4, bilateral C4-5, severe bilateral C5-6 and moderate to severe bilateral C6-7 neural foraminal narrowing. OTHER NECK: Soft tissues of the neck are nonacute though, not tailored for evaluation. Status post thyroidectomy. UPPER CHEST: Included lung apices are clear. No superior mediastinal lymphadenopathy. Status post median sternotomy for CABG. CTA HEAD ANTERIOR CIRCULATION: Patent cervical internal carotid arteries, petrous, cavernous and supra clinoid internal carotid arteries. Patent anterior communicating artery. Patent anterior and middle cerebral arteries. No large vessel occlusion, significant stenosis, contrast extravasation or aneurysm. POSTERIOR CIRCULATION: Occluded LEFT vertebral artery with eccentric calcific atherosclerosis. Distal retrograde flow in reconstitution. Patent RIGHT vertebral artery with mild calcific atherosclerosis. Mild stenosis proximal basilar artery, patent. Patent main branch vessels. Patent though small bilateral posterior cerebral artery's, mild luminal irregularity compatible with atherosclerosis. Moderate stenosis distal bilateral P2 segments. No large vessel occlusion, significant stenosis, contrast extravasation or aneurysm. VENOUS SINUSES: Major dural venous sinuses are patent though not tailored for evaluation on this angiographic examination. ANATOMIC VARIANTS: None. DELAYED PHASE: Enhancing pituitary macroadenoma. MIP images reviewed. IMPRESSION: CT HEAD: 1. Small multifocal LEFT MCA territory infarcts better seen on today's MRI. No hemorrhagic conversion. 2. Moderate to severe chronic small vessel ischemic disease. Old RIGHT basal ganglia infarct. 3. No pituitary macroadenoma better demonstrated by MRI. CTA NECK: 1. Short segment critical stenosis RIGHT ICA origin with  tandem 60% stenosis. 2. 50% stenosis LEFT internal carotid artery origin. 3. Occluded V3 and V4 LEFT vertebral artery with distal V4 reconstitution. 4. Severe stenosis LEFT mid subclavian artery. CTA HEAD: 1. No emergent large vessel occlusion or severe stenosis. 2. Intracranial atherosclerosis, moderate stenosis bilateral distal P2 segments. Aortic Atherosclerosis (ICD10-I70.0). Electronically Signed   By: Elon Alas M.D.   On: 02/22/2017 22:21   Ct Angio Neck W And/or Wo Contrast  Result Date: 02/22/2017 CLINICAL DATA:  Follow-up acute LEFT MCA territory infarcts. History of pituitary macroadenoma, diabetes and Grave's disease. EXAM: CT ANGIOGRAPHY HEAD AND NECK TECHNIQUE: Multidetector CT imaging of the head and neck was performed using the standard protocol during bolus administration of intravenous contrast. Multiplanar CT image reconstructions and MIPs were obtained to evaluate the vascular anatomy. Carotid stenosis measurements (when applicable) are obtained utilizing NASCET criteria, using the distal internal carotid diameter as the denominator. CONTRAST:  64mL ISOVUE-370 IOPAMIDOL (ISOVUE-370) INJECTION 76% COMPARISON:  MRI/MRA head February 22, 2017 at 2023 hours FINDINGS: CT HEAD: BRAIN: No intraparenchymal hemorrhage, mass effect nor midline shift. The ventricles and sulci are normal for age. Patchy supratentorial white matter hypodensities within normal range for patient's age, though non-specific are most compatible with chronic small vessel ischemic disease. No acute large vascular territory infarcts. No abnormal extra-axial fluid collections. Basal cisterns are patent. VASCULAR: Moderate calcific atherosclerosis of the carotid siphons. SKULL: No skull fracture. No significant scalp soft tissue swelling. SINUSES/ORBITS: The mastoid air-cells and included paranasal sinuses are well-aerated.The included ocular globes and orbital contents are non-suspicious. OTHER: None. CTA NECK AORTIC ARCH:  Normal 3.6 cm ascending aorta, 2 vessel arch is a normal variant. Moderate intimal thickening calcific atherosclerosis aortic arch. Moderate stenosis LEFT subclavian artery origin. Severe stenosis mid LEFT subclavian artery. Mild stenosis RIGHT subclavian artery origin. RIGHT CAROTID SYSTEM: Common carotid artery is widely patent, mildly tortuous course and intimal thickening. Calcific atherosclerosis resulting an 4 mm segment critical stenosis RIGHT ICA origin. 3 mm segment 60% stenosis RIGHT internal carotid artery 1 cm from the origin. LEFT CAROTID SYSTEM: Common carotid artery is widely patent, coursing in a straight line fashion. 5 mm segment LEFT ICA origin 50% stenosis by NASCET criteria. Cervical internal carotid artery is patent. VERTEBRAL ARTERIES:RIGHT vertebral artery is dominant. Calcific atherosclerosis resulting in mild stenosis RIGHT vertebral artery origin. Severe stenosis LEFT vertebral artery origin. Gradual decreased contrast opacification than LEFT P2 segment, occluded LEFT V3 segment and V4 segment with distal reconstitution. SKELETON: No acute osseous process though bone windows have not been submitted. Advanced degenerative changes cervical spine. Moderate to severe LEFT C3-4, bilateral C4-5, severe bilateral C5-6 and moderate to severe bilateral C6-7 neural foraminal narrowing. OTHER NECK: Soft tissues of the neck are nonacute though, not tailored for evaluation. Status post thyroidectomy. UPPER CHEST: Included lung apices are clear. No superior mediastinal lymphadenopathy. Status post median sternotomy for CABG. CTA HEAD ANTERIOR CIRCULATION: Patent cervical internal carotid arteries, petrous, cavernous and supra clinoid internal carotid arteries. Patent anterior communicating artery. Patent anterior and middle cerebral arteries. No large vessel occlusion, significant stenosis, contrast extravasation or aneurysm. POSTERIOR CIRCULATION: Occluded LEFT vertebral artery with eccentric calcific  atherosclerosis. Distal retrograde flow in reconstitution. Patent RIGHT vertebral artery with mild calcific atherosclerosis. Mild stenosis proximal basilar artery, patent. Patent main branch vessels. Patent though small bilateral posterior cerebral artery's, mild luminal irregularity compatible with atherosclerosis. Moderate stenosis distal bilateral P2 segments. No large vessel occlusion, significant stenosis, contrast extravasation or aneurysm. VENOUS SINUSES: Major dural venous sinuses are patent though not tailored for evaluation on this angiographic examination. ANATOMIC VARIANTS: None. DELAYED PHASE: Enhancing pituitary macroadenoma. MIP images reviewed. IMPRESSION: CT HEAD: 1. Small multifocal LEFT MCA territory infarcts better seen on today's MRI. No hemorrhagic conversion. 2. Moderate to severe chronic small vessel ischemic disease. Old RIGHT basal ganglia infarct. 3. No pituitary macroadenoma better demonstrated by MRI. CTA NECK: 1. Short segment critical stenosis RIGHT ICA origin with tandem 60% stenosis. 2. 50% stenosis LEFT internal carotid artery origin. 3. Occluded V3 and V4 LEFT vertebral artery with distal V4 reconstitution. 4. Severe stenosis LEFT mid subclavian artery. CTA HEAD: 1. No emergent large vessel occlusion or severe stenosis. 2. Intracranial atherosclerosis, moderate stenosis bilateral distal P2 segments. Aortic Atherosclerosis (ICD10-I70.0). Electronically Signed   By: Elon Alas M.D.   On: 02/22/2017 22:21   Mr Jodene Nam Head Wo Contrast  Result Date: 02/22/2017 CLINICAL DATA:  Leg weakness, fell yesterday. History of pituitary tumor, hypertension and diabetes. EXAM:  MRI HEAD WITHOUT CONTRAST MRA HEAD WITHOUT CONTRAST TECHNIQUE: Multiplanar, multiecho pulse sequences of the brain and surrounding structures were obtained without intravenous contrast. Angiographic images of the head were obtained using MRA technique without contrast. COMPARISON:  MRI of the head November 01, 2016  FINDINGS: MRI HEAD FINDINGS BRAIN: Subcentimeter foci of reduced diffusion LEFT frontal and a lesser extent LEFT parietal lobes involving the cortex, white matter as well and LEFT caudate body. Identifiable lesions demonstrate low ADC values. Punctate reduced diffusion versus artifact LEFT mesial parietal lobe. Scattered chronic micro hemorrhages predominately peripheral distribution, most commonly seen with chronic hypertension. Moderate parenchymal brain volume loss. No hydrocephalus. Patchy to confluent supratentorial white matter FLAIR T2 hyperintensities. Old small LEFT cerebellar infarcts. Old bilateral basal ganglia lacunar infarcts. No midline shift, mass effect or intraparenchymal masses. VASCULAR: Worsening intermediate signal LEFT vertebral artery as characterized below . SKULL AND UPPER CERVICAL SPINE: Re- demonstration of 11 x 14 x 15 mm sella/suprasellar mass most consistent with macroadenoma. Mildly heterogeneous lead bright T1 bone marrow signal seen with osteopenia. Craniocervical junction maintained. SINUSES/ORBITS: The mastoid air-cells and included paranasal sinuses are well-aerated. The included ocular globes and orbital contents are non-suspicious. OTHER: None. MRA HEAD FINDINGS ANTERIOR CIRCULATION: Normal flow related enhancement of the included cervical, petrous, cavernous and supraclinoid internal carotid arteries. Mild luminal irregularity RIGHT internal carotid artery compatible with atherosclerosis. Patent anterior communicating artery. Patent anterior and middle cerebral arteries. Mild luminal irregularity RIGHT M1 segment compatible with atherosclerosis. No large vessel occlusion, flow limiting stenosis, aneurysm. POSTERIOR CIRCULATION: Poor flow related enhancement LEFT vertebral artery, potentially occluded in the neck with reconstitution distally. Patent RIGHT vertebral artery. Basilar artery is patent, with normal flow related enhancement of the main branch vessels. Patent  posterior cerebral arteries. No large vessel occlusion, flow limiting stenosis,  aneurysm. ANATOMIC VARIANTS: None. Source images and MIP images were reviewed. IMPRESSION: MRI HEAD: 1. Multiple subcentimeter infarcts LEFT MCA territory seen with embolic phenomena or, carotid artery stenosis. 2. Moderate to severe chronic small vessel ischemic disease and old lacunar infarcts. 3. Stable appearance of pituitary macroadenoma. MRA HEAD: 1. No emergent large vessel occlusion. 2. Slow flow LEFT vertebral artery suggesting proximal stenosis or occlusion with reconstitution. 3. Mild atherosclerosis. 4. Recommend CTA neck for further evaluation. Critical Value/emergent results were called by telephone at the time of interpretation on 02/22/2017 at 8:40 pm to Dr. Duffy Bruce , who verbally acknowledged these results. Electronically Signed   By: Elon Alas M.D.   On: 02/22/2017 20:44   Mr Brain Wo Contrast  Result Date: 02/22/2017 CLINICAL DATA:  Leg weakness, fell yesterday. History of pituitary tumor, hypertension and diabetes. EXAM: MRI HEAD WITHOUT CONTRAST MRA HEAD WITHOUT CONTRAST TECHNIQUE: Multiplanar, multiecho pulse sequences of the brain and surrounding structures were obtained without intravenous contrast. Angiographic images of the head were obtained using MRA technique without contrast. COMPARISON:  MRI of the head November 01, 2016 FINDINGS: MRI HEAD FINDINGS BRAIN: Subcentimeter foci of reduced diffusion LEFT frontal and a lesser extent LEFT parietal lobes involving the cortex, white matter as well and LEFT caudate body. Identifiable lesions demonstrate low ADC values. Punctate reduced diffusion versus artifact LEFT mesial parietal lobe. Scattered chronic micro hemorrhages predominately peripheral distribution, most commonly seen with chronic hypertension. Moderate parenchymal brain volume loss. No hydrocephalus. Patchy to confluent supratentorial white matter FLAIR T2 hyperintensities. Old small  LEFT cerebellar infarcts. Old bilateral basal ganglia lacunar infarcts. No midline shift, mass effect or intraparenchymal masses. VASCULAR: Worsening intermediate signal LEFT vertebral  artery as characterized below . SKULL AND UPPER CERVICAL SPINE: Re- demonstration of 11 x 14 x 15 mm sella/suprasellar mass most consistent with macroadenoma. Mildly heterogeneous lead bright T1 bone marrow signal seen with osteopenia. Craniocervical junction maintained. SINUSES/ORBITS: The mastoid air-cells and included paranasal sinuses are well-aerated. The included ocular globes and orbital contents are non-suspicious. OTHER: None. MRA HEAD FINDINGS ANTERIOR CIRCULATION: Normal flow related enhancement of the included cervical, petrous, cavernous and supraclinoid internal carotid arteries. Mild luminal irregularity RIGHT internal carotid artery compatible with atherosclerosis. Patent anterior communicating artery. Patent anterior and middle cerebral arteries. Mild luminal irregularity RIGHT M1 segment compatible with atherosclerosis. No large vessel occlusion, flow limiting stenosis, aneurysm. POSTERIOR CIRCULATION: Poor flow related enhancement LEFT vertebral artery, potentially occluded in the neck with reconstitution distally. Patent RIGHT vertebral artery. Basilar artery is patent, with normal flow related enhancement of the main branch vessels. Patent posterior cerebral arteries. No large vessel occlusion, flow limiting stenosis,  aneurysm. ANATOMIC VARIANTS: None. Source images and MIP images were reviewed. IMPRESSION: MRI HEAD: 1. Multiple subcentimeter infarcts LEFT MCA territory seen with embolic phenomena or, carotid artery stenosis. 2. Moderate to severe chronic small vessel ischemic disease and old lacunar infarcts. 3. Stable appearance of pituitary macroadenoma. MRA HEAD: 1. No emergent large vessel occlusion. 2. Slow flow LEFT vertebral artery suggesting proximal stenosis or occlusion with reconstitution. 3. Mild  atherosclerosis. 4. Recommend CTA neck for further evaluation. Critical Value/emergent results were called by telephone at the time of interpretation on 02/22/2017 at 8:40 pm to Dr. Duffy Bruce , who verbally acknowledged these results. Electronically Signed   By: Elon Alas M.D.   On: 02/22/2017 20:44     EKG: Independently reviewed.  Sinus rhythm, QTC 462, LAD, poor R-wave progression  Assessment/Plan Principal Problem:   Stroke (cerebrum) (HCC) Active Problems:   Essential hypertension   Hypothyroidism   Coronary artery disease   S/P CABG x 4   Hyperlipidemia   CKD (chronic kidney disease), stage III (HCC)   Type II diabetes mellitus with renal manifestations (HCC)   Stroke (cerebrum) [facility]): MRA showed slow flow LEFT vertebral artery suggesting proximal stenosis or occlusion with reconstitution. Urology, Dr. Leonel Ramsay was consulted, he recommended to get CTA of head and neck.  - will admit to tele bed  - will follow up Neurology's Recs.  - Obtain CTA of head and neck which were ordered by EDP  -ASA  - fasting lipid panel and HbA1c  - 2D transthoracic echocardiography  - PT/OT consult  Essential hypertension: -hold home Bp meds -IV hydralazine for blood pressure>220  Coronary artery disease:  S/P CABG x 4. No CP -continue ASA  Hyperlipidemia:  -Pravastatin  CKD (chronic kidney disease), stage III ([place]): stable. Baseline creatinine 1.0-1.4. Her creatinine is 1.43, BUN 30. -f/u by BMP  Diet-controlled type II diabetes mellitus with renal manifestations ([place]): Last A1c 6.9 on 10/27/15,  well controled. Patient is not taking meds at home. CBG 120 -check CBG qAM.   DVT ppx:  SQ Lovenox Code Status: Full code Family Communication: None at bed side.      Disposition Plan:  Anticipate discharge back to previous home environment Consults called:  Dr. Leonel Ramsay of neurology: Admission status:  Inpatient/tele       Date of Service 02/23/2017    [facility] Pager [phone]  If 7PM-7AM, please contact night-coverage [email or web address] Password [email or web address] 02/23/2017, 6:02 AM

## 2017-02-22 NOTE — ED Notes (Signed)
Recently dx with pituitary tumor. C/o R arm and Leg "heaviness" since 9:00 last night which caused her to fall. Denies injury from fall. Pt reports ongoing R arm numbness and unable to hold R leg up

## 2017-02-22 NOTE — ED Notes (Signed)
ED Provider at bedside. 

## 2017-02-22 NOTE — ED Triage Notes (Addendum)
Pt reports falling yesterday after her legs giving out suddenly and right arm weakness. Pt denies hitting her head. Pt denies anticouags. No neuro deficits present. Pt speaking in complete sentences, ambulatory w/ normal gait. Pt denies acute vision changes. Pt A+OX4.

## 2017-02-22 NOTE — ED Notes (Signed)
Called  [facility] spoke with [employer]

## 2017-02-22 NOTE — ED Notes (Signed)
Patient still in MRI.  

## 2017-02-22 NOTE — ED Provider Notes (Signed)
81 year old female transferred here for right sided weakness since 9 PM.  Patient has a history of pituitary tumor.  She was reportedly markedly week at [facility] but states her strength seems to be improving.  Unclear whether this represents an improving stroke versus TIA, though time course is more consistent with stroke.  Must also consider possible complication related to her pituitary tumor.  She currently has no visual changes, a aphasia, or neglect.  Her speech seems mildly delayed on my assessment but family states it is at her baseline. She has mild CKD so will discuss with neurology whether they want a CT angio versus MR.    Discussed with Dr. Lorraine Lax - will hold on CT head, obtain MR [facility] for better delineation. Pt updated and in agreement. IVF given for mild baseline CKD.   Patient has obtained MRI.  On preliminary review.  I see multiple areas of enhancement in the diffusion-weighted imaging on both coronal and sagittal planes.  Concern for possible acute multifocal ischemia.  Discussed with radiology.  Will add on an MRA.  Will admit.  D/w Dr. Leonel Ramsay. CT Angio ordered. ASA given. Admit to medicine.   Duffy Bruce, MD 02/22/17 2202

## 2017-02-22 NOTE — ED Notes (Signed)
MRI stated that there are two other people ahead of this patient prior to having MRI.

## 2017-02-22 NOTE — ED Notes (Signed)
Patient unable to urinate at this time.  On way to MRI.

## 2017-02-23 ENCOUNTER — Inpatient Hospital Stay (HOSPITAL_COMMUNITY): Payer: Medicare Other

## 2017-02-23 DIAGNOSIS — I639 Cerebral infarction, unspecified: Secondary | ICD-10-CM

## 2017-02-23 DIAGNOSIS — M6289 Other specified disorders of muscle: Secondary | ICD-10-CM

## 2017-02-23 DIAGNOSIS — E1129 Type 2 diabetes mellitus with other diabetic kidney complication: Secondary | ICD-10-CM | POA: Diagnosis present

## 2017-02-23 DIAGNOSIS — N183 Chronic kidney disease, stage 3 unspecified: Secondary | ICD-10-CM | POA: Diagnosis present

## 2017-02-23 DIAGNOSIS — I6789 Other cerebrovascular disease: Secondary | ICD-10-CM

## 2017-02-23 DIAGNOSIS — I6300 Cerebral infarction due to thrombosis of unspecified precerebral artery: Secondary | ICD-10-CM

## 2017-02-23 LAB — LIPID PANEL
Cholesterol: 226 mg/dL — ABNORMAL HIGH (ref 0–200)
HDL: 31 mg/dL — AB (ref >40)
LDL Cholesterol: 160 mg/dL — ABNORMAL HIGH (ref 0–99)
TRIGLYCERIDES: 176 mg/dL — AB (ref <150)
Total CHOL/HDL Ratio: 7.3 RATIO
VLDL: 35 mg/dL (ref 0–40)

## 2017-02-23 LAB — GLUCOSE, CAPILLARY: Glucose-Capillary: 103 mg/dL — ABNORMAL HIGH (ref 65–99)

## 2017-02-23 LAB — HEMOGLOBIN A1C
Hgb A1c MFr Bld: 7.4 % — ABNORMAL HIGH (ref 4.8–5.6)
Mean Plasma Glucose: 165.68 mg/dL

## 2017-02-23 LAB — ECHOCARDIOGRAM COMPLETE
Height: 64 in
Weight: 2606.72 oz

## 2017-02-23 LAB — T4, FREE: FREE T4: 1.02 ng/dL (ref 0.61–1.12)

## 2017-02-23 LAB — TSH: TSH: 0.431 u[IU]/mL (ref 0.350–4.500)

## 2017-02-23 LAB — CK: CK TOTAL: 66 U/L (ref 38–234)

## 2017-02-23 MED ORDER — SENNOSIDES-DOCUSATE SODIUM 8.6-50 MG PO TABS
1.0000 | ORAL_TABLET | Freq: Two times a day (BID) | ORAL | Status: DC
  Administered 2017-02-23 – 2017-02-24 (×3): 1 via ORAL
  Filled 2017-02-23 (×4): qty 1

## 2017-02-23 MED ORDER — HYDRALAZINE HCL 20 MG/ML IJ SOLN: 5.0000 mg | INTRAMUSCULAR | Status: DC | PRN

## 2017-02-23 MED ORDER — CHLORTHALIDONE 25 MG PO TABS: 25.0000 mg | ORAL_TABLET | Freq: Every day | ORAL | Status: DC

## 2017-02-23 MED ORDER — ENOXAPARIN SODIUM 40 MG/0.4ML ~~LOC~~ SOLN
40.0000 mg | SUBCUTANEOUS | Status: DC
  Administered 2017-02-23 – 2017-02-24 (×2): 40 mg via SUBCUTANEOUS
  Filled 2017-02-23 (×2): qty 0.4

## 2017-02-23 MED ORDER — POLYETHYLENE GLYCOL 3350 17 G PO PACK: 17.0000 g | PACK | Freq: Every day | ORAL | Status: DC | PRN

## 2017-02-23 MED ORDER — ASPIRIN EC 325 MG PO TBEC
325.0000 mg | DELAYED_RELEASE_TABLET | Freq: Every day | ORAL | Status: DC
  Administered 2017-02-24: 325 mg via ORAL
  Filled 2017-02-23: qty 1

## 2017-02-23 MED ORDER — LOSARTAN POTASSIUM 50 MG PO TABS: 50.0000 mg | ORAL_TABLET | Freq: Every day | ORAL | Status: DC

## 2017-02-23 MED ORDER — ZOLPIDEM TARTRATE 5 MG PO TABS: 5.0000 mg | ORAL_TABLET | ORAL | Status: DC | PRN

## 2017-02-23 MED ORDER — STROKE: EARLY STAGES OF RECOVERY BOOK
Freq: Once | Status: AC
  Administered 2017-02-23: 1

## 2017-02-23 MED ORDER — PRAVASTATIN SODIUM 40 MG PO TABS
80.0000 mg | ORAL_TABLET | Freq: Every evening | ORAL | Status: DC
  Administered 2017-02-23: 80 mg via ORAL
  Filled 2017-02-23: qty 2

## 2017-02-23 MED ORDER — ROSUVASTATIN CALCIUM 20 MG PO TABS
40.0000 mg | ORAL_TABLET | Freq: Every day | ORAL | Status: DC
  Filled 2017-02-23: qty 2

## 2017-02-23 MED ORDER — PRAVASTATIN SODIUM 40 MG PO TABS: 40.0000 mg | ORAL_TABLET | Freq: Every evening | ORAL | Status: DC

## 2017-02-23 MED ORDER — LEVOTHYROXINE SODIUM 112 MCG PO TABS
112.0000 ug | ORAL_TABLET | Freq: Every day | ORAL | Status: DC
  Administered 2017-02-23 – 2017-02-24 (×2): 112 ug via ORAL
  Filled 2017-02-23 (×2): qty 1

## 2017-02-23 MED ORDER — ACETAMINOPHEN 325 MG PO TABS
650.0000 mg | ORAL_TABLET | Freq: Four times a day (QID) | ORAL | Status: DC | PRN
  Administered 2017-02-23 – 2017-02-24 (×2): 650 mg via ORAL
  Filled 2017-02-23 (×2): qty 2

## 2017-02-23 NOTE — Progress Notes (Signed)
  Echocardiogram 2D Echocardiogram has been performed.  Merrie Roof F 02/23/2017, 2:32 PM

## 2017-02-23 NOTE — Evaluation (Signed)
Physical Therapy Evaluation Patient Details Name: Amanda Curtis MRN: [id number] DOB: 12-Feb-1936 Today's Date: 02/23/2017   History of Present Illness  81 y.o. female presenting with right-sided weakness and numbness.  Brain MRI indicates scattered strokes in left MCA distribution. Neck CT showing stenosis of right ICA, left ICA, and left mid-subclavian artery as well as occluded left V3 and V4. PMH significant of HTN, HLD, diet-controlled DM, hypothyroidism, pituitiary tumor, insomnia, Graves disease, CAD, CABG, and CKD 3.    Clinical Impression  Pt presents with impaired mobility, difficulty in walking, and impaired sensation secondary to above. Pt tolerates 250-ft ambulation with min guard assist and no assistive device. Pt's RUE more affected than RLE, with only slight sensation and strength deficits of RLE as compared to LLE. Pt independent with mobility PTA and hopes to return to prior level of function. Recommending Outpatient PT upon discharge. If pt advised not to drive, then possible HHPT. Pt will follow acutely to further assess balance and safe mobility within hospital setting as well as update discharge rec as needed.     Follow Up Recommendations Outpatient PT    Equipment Recommendations  None recommended by PT    Recommendations for Other Services       Precautions / Restrictions Precautions Precautions: Fall Restrictions Weight Bearing Restrictions: No      Mobility  Bed Mobility Overal bed mobility: Modified Independent             General bed mobility comments: Pt sitting EOB upon PT arrival.   Transfers Overall transfer level: Needs assistance Equipment used: None Transfers: Sit to/from Stand Sit to Stand: Supervision;Min guard         General transfer comment: x1 from EOB (min guard), x1 from chair (supervision). Pt left in chair post-ambulation.   Ambulation/Gait Ambulation/Gait assistance: Min guard;Supervision Ambulation Distance (Feet): 250  Feet Assistive device: None Gait Pattern/deviations: Step-to pattern;Wide base of support;Decreased stride length Gait velocity: slightly decreased   General Gait Details: Pt notes feeling slightly more unsteady than usual and notes some weakness in right calf. Min guard during ambulation in hall and supervision for ambulation within room.   Stairs            Wheelchair Mobility    Modified Rankin (Stroke Patients Only) Modified Rankin (Stroke Patients Only) Pre-Morbid Rankin Score: No symptoms Modified Rankin: Moderate disability     Balance Overall balance assessment: Needs assistance Sitting-balance support: No upper extremity supported;Feet supported Sitting balance-Leahy Scale: Good Sitting balance - Comments: Pt sits EOB and in chair with ability to weight-shift and use UEs freely. No LOB during MMT of UE or LE.      Standing balance-Leahy Scale: Good Standing balance comment: Pt able to stand statically without external support. Pt demonstrates dynamic balance in turning completing 180 turns without LOB (min guard) and picking up object from floor (supervision).                              Pertinent Vitals/Pain Pain Assessment: No/denies pain    Home Living Family/patient expects to be discharged to:: Private residence Living Arrangements: Alone Available Help at Discharge: Friend(s);Available PRN/intermittently;Family Type of Home: House Home Access: Stairs to enter Entrance Stairs-Rails: None Entrance Stairs-Number of Steps: 1 Home Layout: One level Home Equipment: Walker - 2 wheels      Prior Function Level of Independence: Independent  Hand Dominance   Dominant Hand: Right    Extremity/Trunk Assessment   Upper Extremity Assessment Upper Extremity Assessment: Defer to OT evaluation;RUE deficits/detail RUE Deficits / Details: MMT 3+/5 throughout RUE Sensation: decreased light touch;decreased proprioception RUE  Coordination: decreased fine motor;decreased gross motor    Lower Extremity Assessment Lower Extremity Assessment: RLE deficits/detail RLE Deficits / Details: 4+/5 knee extension as compared to 5/5 on left. slightly diminished sensation at L5 (middle toe) as compared to left. DF 5/5. Hip flexion 4/5 (same as left) RLE Sensation: decreased light touch       Communication   Communication:  Cognition Arousal/Alertness: Awake/alert Behavior During Therapy: WFL for tasks assessed/performed Overall Cognitive Status: Within Functional Limits for tasks assessed                                        General Comments General comments (skin integrity, edema, etc.): Sitting BP 158/78. Discussed f/u therapy upon discharge. Recommended waiting until cleared by MD to drive.     Exercises     Assessment/Plan    PT Assessment Patient needs continued PT services  PT Problem List Decreased mobility;Impaired sensation;Decreased balance       PT Treatment Interventions Functional mobility training;Balance training;Gait training;Therapeutic exercise;Neuromuscular re-education;Therapeutic activities    PT Goals (Current goals can be found in the Care Plan section)  Acute Rehab PT Goals Patient Stated Goal: to go home PT Goal Formulation: With patient Time For Goal Achievement: 03/09/17 Potential to Achieve Goals: Good Additional Goals Additional Goal #1: Pt will be able to recall signs of stroke.    Frequency Min 4X/week   Barriers to discharge        Co-evaluation               AM-PAC PT "6 Clicks" Daily Activity  Outcome Measure Difficulty turning over in bed (including adjusting bedclothes, sheets and blankets)?: A Little Difficulty moving from lying on back to sitting on the side of the bed? : A Little Difficulty sitting down on and standing up from a chair with arms (e.g., wheelchair, bedside commode, etc,.)?: A Little Help needed moving to and from a bed  to chair (including a wheelchair)?: A Little Help needed walking in hospital room?: A Little Help needed climbing 3-5 steps with a railing? : A Little 6 Click Score: 18    End of Session Equipment Utilized During Treatment: Gait belt Activity Tolerance: Patient tolerated treatment well Patient left: in chair;with call bell/phone within reach;with nursing/sitter in room Nurse Communication: Mobility status PT Visit Diagnosis: Other symptoms and signs involving the nervous system (R29.898);Difficulty in walking, not elsewhere classified (R26.2)    Time: 9201-0071 PT Time Calculation (min) (ACUTE ONLY): 19 min   Charges:   PT Evaluation $PT Eval Low Complexity: 1 Low     PT G Codes:        Judee Clara, SPT  Judee Clara 02/23/2017, 10:01 AM

## 2017-02-23 NOTE — Progress Notes (Signed)
Carotid duplex prelim: Right 60-79% ICA stenosis, high end of scale. Left 40-59% ICA stenosis, mid scale. Landry Mellow, RDMS, RVT

## 2017-02-23 NOTE — Progress Notes (Signed)
NEUROHOSPITALISTS STROKE TEAM - DAILY PROGRESS NOTE   ADMISSION HISTORY: Amanda Curtis is a 81 y.o. female who was last her normal self on the evening of 12/26 with sudden start of symptoms around 9 PM that day.  She states that it is been a static deficit since that time.  It is both numbness and weakness, though the weakness is more prominent.  She was transferred to [facility] to obtain an MRI, and once obtained it demonstrated scattered strokes in the left MCA distribution.  LKW: 9 PM 12/26 tpa given?: no, out of window  SUBJECTIVE (INTERVAL HISTORY) No family is at the bedside. Patient is found laying in bed in NAD. Overall she feels her condition is gradually improving. Voices no new complaints. No new events reported overnight. Patient states her symptoms first began on December 26.  She felt her acute onset of weakness was due to her pituitary tumor and did not seek medical attention until now.  OBJECTIVE Lab Results: CBC:  Recent Labs  Lab 02/22/17 1426 02/22/17 2111  WBC 9.4  --   HGB 12.5 12.2  HCT 40.2 36.0  MCV 89.7  --   PLT 490*  --    BMP: Recent Labs  Lab 02/22/17 1426 02/22/17 2111  NA 137 141  K 4.2 3.6  CL 103 108  CO2 23  --   GLUCOSE 120* 103*  BUN 30* 24*  CREATININE 1.43* 1.10*  CALCIUM 9.5  --    Thyroid Function Studies:  Recent Labs    02/23/17 0307  TSH 0.431   Cardiac Enzymes:  Recent Labs  Lab 02/23/17 0307  CKTOTAL 66   Coagulation Studies:  Recent Labs    02/22/17 1426  INR 0.97    PHYSICAL EXAM Temp:  [98 F (36.7 C)-98.3 F (36.8 C)] 98.2 F (36.8 C) (12/28 0620) Pulse Rate:  [54-84] 54 (12/28 0820) Resp:  [11-21] 20 (12/28 0820) BP: (121-178)/(59-103) 158/78 (12/28 0820) SpO2:  [90 %-99 %] 97 % (12/28 0620) Weight:  [72.8 kg (160 lb 7.9 oz)-73.9 kg (162 lb 14.7 oz)] 73.9 kg (162 lb 14.7 oz) (12/28 0500) General - Well nourished, well developed, in no apparent  distress Respiratory - Lungs clear bilaterally. No wheezing. Cardiovascular - Regular rate and rhythm  Neuro: Mental Status: Patient is awake, alert, oriented to person, place, month, year, and situation. Patient is able to give a clear and coherent history. No signs of aphasia or neglect Cranial Nerves: II: Visual Fields are full. Pupils are equal, round, and reactive to light.   III,IV, VI: EOMI without ptosis or diploplia.  V: Facial sensation is symmetric to temperature VII: Facial movement with mild weakness VIII: hearing is intact to voice X: Uvula elevates symmetrically XI: Shoulder shrug is symmetric. XII: tongue is midline without atrophy or fasciculations.  Motor: Tone is normal. Bulk is normal.  She has 4/5 strength of the right arm and 4+/5 of the right leg, with fine motor movements disproportionately affected. Orbits left over right upper extremity. Weakness of right grip Sensory: Sensation is symmetric to light touch and temperature in the arms and legs. Cerebellar: Significant ataxia of the right arm and leg, intact on the left  IMAGING: I have personally reviewed the radiological images below and agree with the radiology interpretations.  Ct Angio Head   And Neck W Or Wo Contrast Result Date: 02/22/2017 IMPRESSION: CT HEAD: 1. Small multifocal LEFT MCA territory infarcts better seen on today's MRI. No hemorrhagic conversion. 2.  Moderate to severe chronic small vessel ischemic disease. Old RIGHT basal ganglia infarct. 3. No pituitary macroadenoma better demonstrated by MRI. CTA NECK: 1. Short segment critical stenosis RIGHT ICA origin with tandem 60% stenosis. 2. 50% stenosis LEFT internal carotid artery origin. 3. Occluded V3 and V4 LEFT vertebral artery with distal V4 reconstitution. 4. Severe stenosis LEFT mid subclavian artery. CTA HEAD: 1. No emergent large vessel occlusion or severe stenosis. 2. Intracranial atherosclerosis, moderate stenosis bilateral distal P2  segments. Aortic Atherosclerosis (ICD10-I70.0).   MRI/ MRA Brain/Head Wo Contrast IMPRESSION: MRI HEAD: 1. Multiple subcentimeter infarcts LEFT MCA territory seen with embolic phenomena or, carotid artery stenosis. 2. Moderate to severe chronic small vessel ischemic disease and old lacunar infarcts. 3. Stable appearance of pituitary macroadenoma. MRA HEAD: 1. No emergent large vessel occlusion. 2. Slow flow LEFT vertebral artery suggesting proximal stenosis or occlusion with reconstitution. 3. Mild atherosclerosis. 4. Recommend CTA neck for further evaluation.   Echocardiogram: not done                                PENDING B/L Carotid U/S:                                                PENDING     ASSESSMENT: Amanda Curtis is a 81 y.o. female with PMH of  DM, HTN, HLD admitted with acute onset right-sided weakness that began month on December 26.  MRI reveals:  Small multifocal LEFT MCA territory infarcts  Suspected Etiology: athero vs cardioembolic source Resultant Symptoms: Right-sided weakness Stroke Risk Factors: diabetes mellitus, hyperlipidemia and hypertension Other Stroke Risk Factors: Advanced age, Hx stroke  Outstanding Stroke Work-up Studies:     Echocardiogram: not done                                    PENDING B/L Carotid U/S:                                                     PENDING  02/23/2017: Neuro exam remained stable.  Denies any new weakness/numbness or headache on exam today. No family at bedside.  Imaging labs and plan of care reviewed with patient.  Patient will likely need vascular surgery intervention.  Will wait for carotid ultrasound results before decision on timing.  Patient will likely need rehab at [facility]  PLAN  02/23/2017: Continue Aspirin/ Statin Frequent neuro checks Telemetry monitoring PT/OT/SLP Consult PM & Rehab May need outpatient TEE and Loop Recorder Placement, decision after all imaging reviewed Ongoing aggressive stroke risk factor  management Patient counseled to be compliant with her antithrombotic medications Patient counseled on Lifestyle modifications including, Diet, Exercise, and Stress Follow up with [facility] in 6 weeks  HX OF STROKES: Old RIGHT basal ganglia infarct  INTRACRANIAL Atherosclerosis &Stenosis: May consider DAPT at discharge, for now full dose aspirin   Critical stenosis RIGHT ICA origin with tandem 60% stenosis  LEFT internal carotid artery 50% stenosis  Carotid ultrasound report pending Patient will likely need vascular surgery consultation for  right CEA at some point and possibly left symptomatic ICA stenosis  R/O AFIB: May need Outpatient TEE and Loop Recorder Placement   HYPERTENSION: Stable, some elevated blood pressures noted overnight Long term BP goal normotensive. May slowly restart home B/P medications after 48 hours Home Meds: NONE  HYPERLIPIDEMIA:    Component Value Date/Time   CHOL 226 (H) 02/23/2017 0307   CHOL 289 (H) 06/28/2016 1514   TRIG 176 (H) 02/23/2017 0307   HDL 31 (L) 02/23/2017 0307   HDL 39 (L) 06/28/2016 1514   CHOLHDL 7.3 02/23/2017 0307   VLDL 35 02/23/2017 0307   LDLCALC 160 (H) 02/23/2017 0307   LDLCALC 191 (H) 06/28/2016 1514  Home Meds: Pravachol 40 mg LDL  goal < 70 Increased Pravachol to 80 mg daily Continue statin at discharge  DIABETES: Lab Results  Component Value Date   HGBA1C 7.4 (H) 02/23/2017  HgbA1c goal < 7.0 Currently on: Will need NovoLog Continue CBG monitoring and SSI to maintain glucose 140-180 mg/dl DM education   Other Active Problems: Principal Problem:   Stroke (cerebrum) (HCC) Active Problems:   Essential hypertension   Hypothyroidism   Coronary artery disease   S/P CABG x 4   Hyperlipidemia   CKD (chronic kidney disease), stage III (HCC)   Type II diabetes mellitus with renal manifestations [facility]  Hospital day # 1 VTE prophylaxis: Lovenox  Diet : Diet heart healthy/carb modified Room  service appropriate? Yes; Fluid consistency: Thin   FAMILY UPDATES: No family at bedside  TEAM UPDATES: Nita Sells, MD     Prior Home Stroke Medications:  aspirin 81 mg daily  Discharge Stroke Meds:  Please discharge patient on aspirin 325 mg daily   Disposition: 01-Home or Self Care Therapy Recs:               Home health PT OT Home Equipment:         None Follow Up:  Follow-up Information    Garvin Fila, MD. Schedule an appointment as soon as possible for a visit in 6 week(s).   Specialties:  Neurology, Radiology Contact information: [address] [place] [address] [phone]          Clerance Lav, PA-C -PCP Follow up in 1-2 weeks    Renie Ora Stroke Neurology Team 02/23/2017 1:14 PM I have personally examined this patient, reviewed notes, independently viewed imaging studies, participated in medical decision making and plan of care.ROS completed by me personally and pertinent positives fully documented  I have made any additions or clarifications directly to the above note. Agree with note above.  She has presented with embolic left MCA infarct and CT angiogram shows heavily calcified high-grade proximal right internal carotid and moderate left internal carotid artery stenosis. Will check carotid ultrasound and if she has greater than 60% left ICA stenosis may consider left carotid revascularization prior to elective right carotid revascularization. Continue ongoing stroke workup. Greater than 50% time during the study family at visit was spent on counseling and coordination of care about her stroke and answered questions  Antony Contras, MD Medical Director [place] Pager: [phone] 02/23/2017 2:40 PM  To contact Stroke Continuity provider, please refer to [email or web address] After hours, contact General Neurology

## 2017-02-23 NOTE — Consult Note (Signed)
Neurology Consultation Reason for Consult: Stroke Referring Physician: Mora Bellman  CC: Right-sided weakness  History is obtained from: Patient  HPI: Amanda Curtis is a 81 y.o. female who was last her normal self on the evening of 12/26 with sudden start of symptoms around 9 PM that day.  She states that it is been a static deficit since that time.  It is both numbness and weakness, though the weakness is more prominent.  She was transferred to [facility] to obtain an MRI, and once obtained it demonstrated scattered strokes in the left MCA distribution.   LKW: 9 PM 12/26 tpa given?: no, out of window    ROS: A 14 point ROS was performed and is negative except as noted in the HPI.   Past Medical History:  Diagnosis Date  . Arthritis   . Diabetes mellitus without complication ([place])   . Graves disease 2000   s/p RIA  . Hypertension   . Insomnia   . Pituitary tumor   . Vitamin D deficiency      Family History  Problem Relation Age of Onset  . Dementia Mother   . Cancer Mother        breast  . Diabetes Father   . Diabetes Paternal Grandmother   . Hypertension Paternal Grandmother      Social History:  reports that  has never smoked. she has never used smokeless tobacco. She reports that she does not drink alcohol or use drugs.   Exam: Current vital signs: BP (!) 178/66 (BP Location: Right Arm)   Pulse 61   Temp 98.1 F (36.7 C) (Oral)   Resp 20   Ht 5\' 4"  (1.626 m)   Wt 73.9 kg (162 lb 14.7 oz)   SpO2 99%   BMI 27.97 kg/m  Vital signs in last 24 hours: Temp:  [98 F (36.7 C)-98.3 F (36.8 C)] 98.1 F (36.7 C) (12/28 0420) Pulse Rate:  [61-84] 61 (12/27 2205) Resp:  [11-21] 20 (12/28 0420) BP: (121-178)/(59-103) 178/66 (12/28 0420) SpO2:  [90 %-99 %] 99 % (12/28 0420) Weight:  [72.8 kg (160 lb 7.9 oz)-73.9 kg (162 lb 14.7 oz)] 73.9 kg (162 lb 14.7 oz) (12/28 0500)   Physical Exam  Constitutional: Appears well-developed and well-nourished.  Psych: Affect  appropriate to situation Eyes: No scleral injection HENT: No OP obstrucion Head: Normocephalic.  Cardiovascular: Normal rate and regular rhythm.  Respiratory: Effort normal, non-labored breathing GI: Soft.  No distension. There is no tenderness.  Skin: WDI  Neuro: Mental Status: Patient is awake, alert, oriented to person, place, month, year, and situation. Patient is able to give a clear and coherent history. No signs of aphasia or neglect Cranial Nerves: II: Visual Fields are full. Pupils are equal, round, and reactive to light.   III,IV, VI: EOMI without ptosis or diploplia.  V: Facial sensation is symmetric to temperature VII: Facial movement with mild weakness VIII: hearing is intact to voice X: Uvula elevates symmetrically XI: Shoulder shrug is symmetric. XII: tongue is midline without atrophy or fasciculations.  Motor: Tone is normal. Bulk is normal.  She has 4/5 strength of the right arm and 4+/5 of the right leg, with fine motor movements disproportionately affected. Sensory: Sensation is symmetric to light touch and temperature in the arms and legs. Cerebellar: Significant ataxia of the right arm and leg, intact on the left     I have reviewed labs in epic and the results pertinent to this consultation are: LDL 160  A1c 7.4  I have reviewed the images obtained: CTA-multifocal stenosis, but no critical stenosis in the left distribution to explain her stroke.  Impression: 81 year old female with likely embolic left MCA distribution infarcts.  The artery to artery is a possibility with a 50% stenosis of the left ICA, I think cardioembolic have to be considered as well.  Recommendations: 1. HgbA1c 2. Frequent neuro checks 3. Echocardiogram 4.  More aggressive statin therapy given LDL of 160 5. Prophylactic therapy-Antiplatelet med: Aspirin - dose 325mg  PO or 300mg  PR 6. Risk factor modification 7. Telemetry monitoring 8. PT consult, OT consult, Speech consult 9.  please page stroke NP  Or  PA  Or MD  from 8am -4 pm as this patient will be followed by the stroke team at this point.   You can look them up on [email or web address]      Roland Rack, MD [facility] [phone]  If 7pm- 7am, please page neurology on call as listed in [place].

## 2017-02-23 NOTE — Care Management Note (Signed)
Case Management Note  Patient Details  Name: Amanda Curtis MRN: [id number] Date of Birth: 1936-01-01  Subjective/Objective:     Pt admitted with CVA. She is from home alone.                Action/Plan: PT recommending outpatient therapy. OT recommending [place] services. CM following for d/c needs, physician orders.  Expected Discharge Date:                  Expected Discharge Plan:  OP Rehab  In-House Referral:     Discharge planning Services  CM Consult  Post Acute Care Choice:    Choice offered to:     DME Arranged:    DME Agency:     HH Arranged:    HH Agency:     Status of Service:  In process, will continue to follow  If discussed at Long Length of Stay Meetings, dates discussed:    Additional Comments:  Pollie Friar, RN 02/23/2017, 11:28 AM

## 2017-02-23 NOTE — Progress Notes (Signed)
Hospitalist progress note         Amanda Curtis  OZH:[id number] DOB: 1935-12-30 DOA: 02/22/2017 PCP: Clerance Lav, PA-C   Specialists:   Brief Narrative:  5 fem htn, hld, dm ty ii, graves dx s/p RAI-hypothy, Pit tumour, CAd s/p CABG x 4 07/09/13, CKD 4 Admit 12/27 after sudden R sided weakness 9 pm 12/26-MRI showed slow flow -CTA head and neck no critical stenosis   Assessment & Plan:   Assessment:  The encounter diagnosis was Acute right-sided weakness.  Embolic L MCA infarct-stroke work-up pending-awaiting echo-neuro requesting Carotids as calcium noted on CT CAd s/p CABG x 4-increased ASA 81->325 per neuro. not on BB CKD iii-baseline Cr 1-1.4. Currently 1.1. conisder change [place]->hctz and adding BB, cut back losartan 50--25 Reflux-add Protonix 4 daily Graves s/p RAI-cont Synthroid 112-recheck TSH 1 mo Dm ty ii-A1c 7.4 this admit not on home meds hld-LDL 160, TOtal 226.  Needs statin--change to high intensity dosing pravachol 8-0 daily   DVT prophylaxis: lovenox Code Status: full Family Communication: called grandson and updated Disposition Plan: await therapy evaluations and ECHO   Consultants:    neurology  Procedures:   none  Antimicrobials:   none   Subjective:  Awake alert in nad About to have breakfast Slight confusion subj weaker on the R side, Arm>leg    Objective: Vitals:   02/23/17 0220 02/23/17 0420 02/23/17 0500 02/23/17 0620  BP: 121/65 (!) 178/66  (!) 165/78  Pulse:      Resp: 20 20  20   Temp: 98.1 F (36.7 C) 98.1 F (36.7 C)  98.2 F (36.8 C)  TempSrc: Oral Oral  Oral  SpO2: 97% 99%  97%  Weight:   73.9 kg (162 lb 14.7 oz)   Height:        Intake/Output Summary (Last 24 hours) at 02/23/2017 0853 Last data filed at 02/22/2017 2300 Gross per 24 hour  Intake 1240 ml  Output 300 ml  Net 940 ml   Filed Weights   02/22/17 1400 02/22/17 2205 02/23/17 0500  Weight: 72.8 kg (160 lb 7.9 oz) 73.8 kg (162 lb 11.2 oz) 73.9 kg  (162 lb 14.7 oz)    Examination:  Vision ok with direct confrontation, shoulder shrug intact, uvula nv, tongue midlines, no facial twist s1 s2 no m/r/g abd soft nt nd  cta b Skin benign Power LE R slightly weaker than L Plantars down going   Data Reviewed: I have personally reviewed following labs and imaging studies  CBC: Recent Labs  Lab 02/22/17 1426 02/22/17 2111  WBC 9.4  --   NEUTROABS 6.4  --   HGB 12.5 12.2  HCT 40.2 36.0  MCV 89.7  --   PLT 490*  --    Basic Metabolic Panel: Recent Labs  Lab 02/22/17 1426 02/22/17 2111  NA 137 141  K 4.2 3.6  CL 103 108  CO2 23  --   GLUCOSE 120* 103*  BUN 30* 24*  CREATININE 1.43* 1.10*  CALCIUM 9.5  --    GFR: Estimated Creatinine Clearance: 40.9 mL/min (A) (by C-G formula based on SCr of 1.1 mg/dL (H)). Liver Function Tests: No results for input(s): AST, ALT, ALKPHOS, BILITOT, PROT, ALBUMIN in the last 168 hours. No results for input(s): LIPASE, AMYLASE in the last 168 hours. No results for input(s): AMMONIA in the last 168 hours. Coagulation Profile: Recent Labs  Lab 02/22/17 1426  INR 0.97   Cardiac Enzymes: Recent Labs  Lab 02/23/17 [phone]  CKTOTAL 66   CBG: Recent Labs  Lab 02/22/17 1411 02/22/17 2239 02/23/17 0636  GLUCAP 121* 92 103*   Urine analysis:    Component Value Date/Time   COLORURINE YELLOW 11/01/2016 [place] 11/01/2016 1443   LABSPEC <1.005 (L) 11/01/2016 1443   PHURINE 7.0 11/01/2016 1443   GLUCOSEU NEGATIVE 11/01/2016 1443   GLUCOSEU NEGATIVE 11/30/2014 1534   HGBUR TRACE (A) 11/01/2016 1443   BILIRUBINUR NEGATIVE 11/01/2016 1443   BILIRUBINUR 1+ 05/31/2015 1206   KETONESUR NEGATIVE 11/01/2016 1443   PROTEINUR NEGATIVE 11/01/2016 1443   UROBILINOGEN negative 05/31/2015 1206   UROBILINOGEN 0.2 11/30/2014 1534   NITRITE NEGATIVE 11/01/2016 1443   LEUKOCYTESUR NEGATIVE 11/01/2016 1443     Radiology Studies: Reviewed images personally in health database     Scheduled Meds: . enoxaparin (LOVENOX) injection  40 mg Subcutaneous Q24H  . levothyroxine  112 mcg Oral QAC breakfast  . pravastatin  80 mg Oral QPM  . senna-docusate  1 tablet Oral BID   Continuous Infusions:   LOS: 1 day    Time spent: [place], MD [facility] (P) [phone]   If 7PM-7AM, please contact night-coverage [email or web address] Password [facility] 02/23/2017, 8:53 AM

## 2017-02-23 NOTE — Evaluation (Signed)
SLP Cancellation Note  Patient Details Name: Amanda Curtis MRN: [id number] DOB: [birth year]   Cancelled treatment:       Reason Eval/Treat Not Completed: Other (comment)(pt just got breakfast, will continue efforts)   Macario Golds 02/23/2017, 9:48 AM  Luanna Salk, [facility] SLP [phone]

## 2017-02-23 NOTE — Evaluation (Signed)
Occupational Therapy Evaluation Patient Details Name: Amanda Curtis MRN: [id number] DOB: 1935-06-07 Today's Date: 02/23/2017    History of Present Illness Pt is a 81 y.o. female who was last her normal self on the evening of 12/26 with sudden start of symptoms around 9 PM that day.  She states that it is been a static deficit since that time.  It is both numbness and weakness, though the weakness is more prominent.  She was transferred to [facility] to obtain an MRI, and once obtained it demonstrated scattered strokes in the left MCA distribution.   Clinical Impression   Pt presents with right UE deficits (dominant hand).  PTA, she is independent with ADLs and IADLs. Pt does have family near by but prefers not to call them for assist per her report.  During eval, pt completing tasks at supervision-min guard level.  Pt would benefit from f/u occupational therapy when discharged.  Recommend HHOT since she will likely not have someone to drive her to therapy. Will continue to follow acutely in order to address deficits listed below and to further maximize safety and independence with ADLs.    Follow Up Recommendations  Home health OT;Supervision - Intermittent    Equipment Recommendations  None recommended by OT    Recommendations for Other Services       Precautions / Restrictions Precautions Precautions: Fall Restrictions Weight Bearing Restrictions: No      Mobility Bed Mobility Overal bed mobility: Modified Independent                Transfers Overall transfer level: Needs assistance Equipment used: 1 person hand held assist Transfers: Sit to/from Stand Sit to Stand: Min guard              Balance                                           ADL either performed or assessed with clinical judgement   ADL Overall ADL's : Needs assistance/impaired     Grooming: Wash/dry hands;Wash/dry face;Standing;Supervision/safety   Upper Body Bathing:  Set up;Supervision/ safety;Sitting   Lower Body Bathing: Min guard;Sit to/from stand   Upper Body Dressing : Set up;Supervision/safety;Sitting   Lower Body Dressing: Min guard;Sit to/from stand   Toilet Transfer: Min guard           Functional mobility during ADLs: Min guard       Vision Baseline Vision/History: Cataracts Patient Visual Report: No change from baseline Additional Comments: Pt reports some blurriness at baseline, reports pituitary tumor that has been affecting vision.      Perception     Praxis      Pertinent Vitals/Pain Pain Assessment: No/denies pain     Hand Dominance Right   Extremity/Trunk Assessment Upper Extremity Assessment Upper Extremity Assessment: RUE deficits/detail RUE Deficits / Details: MMT 3+/5 throughout RUE Sensation: decreased light touch;decreased proprioception RUE Coordination: decreased fine motor;decreased gross motor   Lower Extremity Assessment Lower Extremity Assessment: Defer to PT evaluation       Communication Communication Communication: Expressive difficulties   Cognition Arousal/Alertness: Awake/alert Behavior During Therapy: WFL for tasks assessed/performed Overall Cognitive Status: Within Functional Limits for tasks assessed  General Comments  Sitting BP 158/78. Discussed f/u therapy upon discharge. Recommended waiting until cleared by MD to drive.     Exercises     Shoulder Instructions      Home Living Family/patient expects to be discharged to:: Private residence Living Arrangements: Alone Available Help at Discharge: Friend(s);Available PRN/intermittently;Family Type of Home: House Home Access: Stairs to enter [employer] of Steps: 1 Entrance Stairs-Rails: None Home Layout: One level     Bathroom Shower/Tub: Walk-in shower;Tub/shower unit   Bathroom Toilet: Standard     Home Equipment: Walker - 2 wheels          Prior  Functioning/Environment Level of Independence: Independent                 OT Problem List: Decreased strength;Decreased activity tolerance;Impaired balance (sitting and/or standing);Impaired vision/perception;Decreased coordination;Decreased knowledge of use of DME or AE;Decreased knowledge of precautions;Impaired sensation;Impaired UE functional use      OT Treatment/Interventions: Self-care/ADL training;Therapeutic exercise;Neuromuscular education;DME and/or AE instruction;Therapeutic activities;Patient/family education;Balance training;Visual/perceptual remediation/compensation    OT Goals(Current goals can be found in the care plan section) Acute Rehab OT Goals Patient Stated Goal: to go home OT Goal Formulation: With patient Time For Goal Achievement: 03/09/17 Potential to Achieve Goals: Good  OT Frequency: Min 3X/week   Barriers to D/C: Decreased caregiver support  Patient lives alone.       Co-evaluation              AM-PAC PT "6 Clicks" Daily Activity     Outcome Measure Help from another person eating meals?: None Help from another person taking care of personal grooming?: A Little Help from another person toileting, which includes using toliet, bedpan, or urinal?: A Little Help from another person bathing (including washing, rinsing, drying)?: A Little Help from another person to put on and taking off regular upper body clothing?: A Little Help from another person to put on and taking off regular lower body clothing?: A Little 6 Click Score: 19   End of Session Equipment Utilized During Treatment: Gait belt Nurse Communication: Mobility status  Activity Tolerance: Patient tolerated treatment well Patient left: (ambulating with PT)  OT Visit Diagnosis: Unsteadiness on feet (R26.81);Hemiplegia and hemiparesis;Muscle weakness (generalized) (M62.81);Low vision, both eyes (H54.2)                Time: 8099-8338 OT Time Calculation (min): 25 min Charges:  OT  General Charges $OT Visit: 1 Visit OT Evaluation $OT Eval Low Complexity: 1 Low OT Treatments $Self Care/Home Management : 8-22 mins G-Codes:       Darrol Jump OTR/L 02/23/2017, 9:05 AM

## 2017-02-24 ENCOUNTER — Inpatient Hospital Stay (HOSPITAL_COMMUNITY): Payer: Medicare Other

## 2017-02-24 DIAGNOSIS — I63132 Cerebral infarction due to embolism of left carotid artery: Secondary | ICD-10-CM

## 2017-02-24 MED ORDER — ASPIRIN 325 MG PO TBEC: 325.0000 mg | DELAYED_RELEASE_TABLET | Freq: Every day | ORAL | 2 refills | Status: DC

## 2017-02-24 MED ORDER — GLYBURIDE 2.5 MG PO TABS: 2.5000 mg | ORAL_TABLET | Freq: Every day | ORAL | 1 refills | Status: DC

## 2017-02-24 MED ORDER — PRAVASTATIN SODIUM 80 MG PO TABS: 40.0000 mg | ORAL_TABLET | Freq: Every evening | ORAL | 2 refills | Status: DC

## 2017-02-24 NOTE — Care Management (Signed)
HH choice presented to pt and daughter.  After calls to several [facility] agencies, [facility] accepted pt for PT and OT.  Information placed on AVS and daughter advised.  SOC to begin within 48 hours.  Pt states she has walker at home.  No other equipment needed. No further CM needs.

## 2017-02-24 NOTE — Progress Notes (Signed)
Occupational Therapy Treatment Patient Details Name: Amanda Curtis MRN: [id number] DOB: 03-24-1935 Today's Date: 02/24/2017    History of present illness 81 y.o. female presenting with right-sided weakness and numbness.  Brain MRI indicates scattered strokes in left MCA distribution. Neck CT showing stenosis of right ICA, left ICA, and left mid-subclavian artery as well as occluded left V3 and V4. PMH significant of HTN, HLD, diet-controlled DM, hypothyroidism, pituitiary tumor, insomnia, Graves disease, CAD, CABG, and CKD 3.   OT comments  Focus of session integration of use of R hand for ADL completion including higher level tasks.  Pt. Requires cues to use R hand during tasks.  Agree will benefit from continued therapy.  Reviewed benefits of having intermittent assistance from family also.    Follow Up Recommendations  Home health OT;Supervision - Intermittent    Equipment Recommendations  None recommended by OT    Recommendations for Other Services      Precautions / Restrictions Precautions Precautions: Fall       Mobility Bed Mobility               General bed mobility comments: pt. seated and standing at sink upon arrival  Transfers Overall transfer level: Needs assistance Equipment used: None Transfers: Sit to/from Stand;Stand Pivot Transfers Sit to Stand: Min guard Stand pivot transfers: Min guard            Balance                                           ADL either performed or assessed with clinical judgement   ADL Overall ADL's : Needs assistance/impaired     Grooming: Wash/dry hands;Brushing hair;Supervision/safety;Standing;Cueing for compensatory techniques Grooming Details (indicate cue type and reason): pt. stood to apply make up. required cues to integrate use of right hand during grooming tasks                             Functional mobility during ADLs: Min guard General ADL Comments: pt. required cues  for use of R hand during compensatory tasks.  states "its just so heavy i hate dealing with it".  able to complete bed making and organizing items in her room.  reviewed benefits of cont. therapy and intermittent assistance from family members     Vision       Perception     Praxis      Cognition Arousal/Alertness: Awake/alert Behavior During Therapy: WFL for tasks assessed/performed Overall Cognitive Status: Within Functional Limits for tasks assessed                                          Exercises     Shoulder Instructions       General Comments      Pertinent Vitals/ Pain       Pain Assessment: No/denies pain  Home Living                                          Prior Functioning/Environment              Frequency  Min 3X/week  Progress Toward Goals  OT Goals(current goals can now be found in the care plan section)  Progress towards OT goals: Progressing toward goals     Plan      Co-evaluation                 AM-PAC PT "6 Clicks" Daily Activity     Outcome Measure   Help from another person eating meals?: None Help from another person taking care of personal grooming?: A Little Help from another person toileting, which includes using toliet, bedpan, or urinal?: A Little Help from another person bathing (including washing, rinsing, drying)?: A Little Help from another person to put on and taking off regular upper body clothing?: A Little Help from another person to put on and taking off regular lower body clothing?: A Little 6 Click Score: 19    End of Session    OT Visit Diagnosis: Unsteadiness on feet (R26.81);Hemiplegia and hemiparesis;Muscle weakness (generalized) (M62.81);Low vision, both eyes (H54.2)   Activity Tolerance Patient tolerated treatment well   Patient Left     Nurse Communication          Time: 6269-4854 OT Time Calculation (min): 25 min  Charges: OT General  Charges $OT Visit: 1 Visit OT Treatments $Self Care/Home Management : 23-37 mins  Janice Coffin , COTA/L 02/24/2017, 10:29 AM

## 2017-02-24 NOTE — Progress Notes (Addendum)
NEUROHOSPITALISTS STROKE TEAM - DAILY PROGRESS NOTE   SUBJECTIVE (INTERVAL HISTORY) No family is at the bedside. Patient is sitting in chair without distress. Vascular surgery consult called by Dr. Verlon Au, but not note yet. Pt denies any heart palpitation, but stated that her legs has been on and off swelling and pain since starting losartan 1.5 years ago.   OBJECTIVE Lab Results: CBC:  Recent Labs  Lab 02/22/17 1426 02/22/17 2111  WBC 9.4  --   HGB 12.5 12.2  HCT 40.2 36.0  MCV 89.7  --   PLT 490*  --    BMP: Recent Labs  Lab 02/22/17 1426 02/22/17 2111  NA 137 141  K 4.2 3.6  CL 103 108  CO2 23  --   GLUCOSE 120* 103*  BUN 30* 24*  CREATININE 1.43* 1.10*  CALCIUM 9.5  --    Thyroid Function Studies:  Recent Labs    02/23/17 0307  TSH 0.431   Cardiac Enzymes:  Recent Labs  Lab 02/23/17 0307  CKTOTAL 66   Coagulation Studies:  Recent Labs    02/22/17 1426  INR 0.97    PHYSICAL EXAM Temp:  [98 F (36.7 C)-98.3 F (36.8 C)] 98 F (36.7 C) (12/29 0550) Pulse Rate:  [54-66] 58 (12/29 0550) Resp:  [16-20] 16 (12/29 0550) BP: (133-158)/(67-89) 136/89 (12/29 0550) SpO2:  [96 %] 96 % (12/29 0550) General - Well nourished, well developed, in no apparent distress Respiratory - Lungs clear bilaterally. No wheezing. Cardiovascular - Regular rate and rhythm  Neuro: Mental Status: Patient is awake, alert, oriented to person, place, month, year, and situation. Patient is able to give a clear and coherent history. No signs of aphasia or neglect Cranial Nerves: II: Visual Fields are full. Pupils are equal, round, and reactive to light.   III,IV, VI: EOMI without ptosis or diploplia.  V: Facial sensation is symmetric to temperature VII: Facial movement with mild weakness VIII: hearing is intact to voice X: Uvula elevates symmetrically XI: Shoulder shrug is symmetric. XII: tongue is midline without atrophy  or fasciculations.  Motor: Tone is normal. Bulk is normal.  She has 4/5 strength of the right arm and 4+/5 of the right leg, with fine motor movements disproportionately affected. Orbits left over right upper extremity. Weakness of right grip Sensory: Sensation is symmetric to light touch and temperature in the arms and legs. Cerebellar: Significant ataxia of the right arm and leg, intact on the left  IMAGING: I have personally reviewed the radiological images below and agree with the radiology interpretations.  Ct Angio Head   And Neck W Or Wo Contrast Result Date: 02/22/2017  CT HEAD:  IMPRESSION:  1. Small multifocal LEFT MCA territory infarcts better seen on today's MRI. No hemorrhagic conversion.  2. Moderate to severe chronic small vessel ischemic disease. Old RIGHT basal ganglia infarct.  3. No pituitary macroadenoma better demonstrated by MRI.   CTA NECK:  IMPRESSION:  1. Short segment critical stenosis RIGHT ICA origin with tandem 60% stenosis.  2. 50% stenosis LEFT internal carotid artery origin.  3. Occluded V3 and V4 LEFT vertebral artery with distal V4 reconstitution.  4. Severe stenosis LEFT mid subclavian artery.   CTA HEAD:  IMPRESSION:  1. No emergent large vessel occlusion or severe stenosis.  2. Intracranial atherosclerosis, moderate stenosis bilateral distal P2 segments.  Aortic Atherosclerosis   MRI/ MRA Brain/Head Wo Contrast 02/22/2017 MRI HEAD:  IMPRESSION:  1. Multiple subcentimeter infarcts LEFT MCA territory seen with embolic  phenomena or, carotid artery stenosis.  2. Moderate to severe chronic small vessel ischemic disease and old lacunar infarcts.  3. Stable appearance of pituitary macroadenoma.   MRA HEAD:  IMPRESSION:  1. No emergent large vessel occlusion.  2. Slow flow LEFT vertebral artery suggesting proximal stenosis or occlusion with reconstitution.  3. Mild atherosclerosis.  4. Recommend CTA neck for further evaluation.    Echocardiogram:  02/23/2017 Study Conclusions  - Left ventricle: The cavity size was normal. There was mild   concentric hypertrophy. Systolic function was vigorous. The   estimated ejection fraction was in the range of 65% to 70%. Wall   motion was normal; there were no regional wall motion   abnormalities. Doppler parameters are consistent with abnormal   left ventricular relaxation (grade 1 diastolic dysfunction).   Doppler parameters are consistent with elevated ventricular   end-diastolic filling pressure. - Aortic valve: Trileaflet; mildly thickened, mildly calcified    leaflets. There was no regurgitation. - Mitral valve: Calcified annulus. Mildly thickened leaflets .   There was no regurgitation. - Right ventricle: The cavity size was normal. Wall thickness was   normal. Systolic function was normal. - Right atrium: The atrium was normal in size. - Tricuspid valve: There was trivial regurgitation. - Pulmonary arteries: Systolic pressure was within the normal   range. - Inferior vena cava: The vessel was normal in size. - Pericardium, extracardiac: There was no pericardial effusion.   B/L Carotid U/S:        02/23/2017 Carotid duplex prelim:  Right 60-79% ICA stenosis, high end of scale. Left 40-59% ICA stenosis, mid scale.  LE venous doppler pending                                         ASSESSMENT: [name] is a 81 y.o. female with PMH of  DM, HTN, HLD admitted with acute onset right-sided weakness that began month on December 26.  MRI reveals:  Small multifocal LEFT MCA territory infarcts  Suspected Etiology: athero vs cardioembolic source Resultant Symptoms: Right-sided weakness Stroke Risk Factors: diabetes mellitus, hyperlipidemia and hypertension Other Stroke Risk Factors: Advanced age, Hx stroke  Outstanding Stroke Work-up Studies:     Echocardiogram: EF 65-70%. No cardiac source of emboli identified.  B/L Carotid U/S: Right 60-79% ICA  stenosis, high end of scale. Left 40-59% ICA stenosis, mid scale.                                                     02/24/2017: Neuro exam remained stable.  Denies any new weakness/numbness or headache on exam today. No family at bedside.  Imaging labs and plan of care reviewed with patient.  Patient will likely need vascular surgery intervention.  Will wait for carotid ultrasound results before decision on timing.  Patient will likely need rehab at [facility].  PLAN  02/24/2017: Continue Aspirin/ Statin Frequent neuro checks Telemetry monitoring PT/OT/SLP Consult PM & Rehab May need outpatient TEE and Loop Recorder Placement, decision after all imaging reviewed Ongoing aggressive stroke risk factor management Patient counseled to be compliant with her antithrombotic medications Patient counseled on Lifestyle modifications including, Diet, Exercise, and Stress Follow up with [facility] in 6 weeks  HX  OF STROKES: Old RIGHT basal ganglia infarct  INTRACRANIAL Atherosclerosis &Stenosis: May consider DAPT at discharge, for now full dose aspirin   Critical stenosis RIGHT ICA origin with tandem 60% stenosis  LEFT internal carotid artery 50% stenosis  Carotid ultrasound report pending Vascular surgery consultation for right CEA at some point and possibly left symptomatic   Dr Bridgett Larsson contacted  R/O AFIB: May need Outpatient TEE and Loop Recorder Placement   HYPERTENSION: Stable, some elevated blood pressures noted overnight Long term BP goal normotensive. May slowly restart home B/P medications after 48 hours Home Meds: NONE  HYPERLIPIDEMIA:    Component Value Date/Time   CHOL 226 (H) 02/23/2017 0307   CHOL 289 (H) 06/28/2016 1514   TRIG 176 (H) 02/23/2017 0307   HDL 31 (L) 02/23/2017 0307   HDL 39 (L) 06/28/2016 1514   CHOLHDL 7.3 02/23/2017 0307   VLDL 35 02/23/2017 0307   LDLCALC 160 (H) 02/23/2017 0307   LDLCALC 191 (H) 06/28/2016 1514  Home Meds: Pravachol 40  mg LDL  goal < 70 Increased Pravachol to 80 mg daily Continue statin at discharge  DIABETES: Lab Results  Component Value Date   HGBA1C 7.4 (H) 02/23/2017  HgbA1c goal < 7.0 Currently on: Will need NovoLog Continue CBG monitoring and SSI to maintain glucose 140-180 mg/dl DM education   Other Active Problems: Principal Problem:   Stroke (cerebrum) (HCC) Active Problems:   Essential hypertension   Hypothyroidism   Coronary artery disease   S/P CABG x 4   Hyperlipidemia   CKD (chronic kidney disease), stage III (HCC)   Type II diabetes mellitus with renal manifestations [facility])  Hospital day # 2 VTE prophylaxis: Lovenox  Diet : Diet heart healthy/carb modified Room service appropriate? Yes; Fluid consistency: Thin   FAMILY UPDATES: No family at bedside  TEAM UPDATES: Nita Sells, MD     Prior Home Stroke Medications:  aspirin 81 mg daily  Discharge Stroke Meds:  Please discharge patient on aspirin 325 mg daily   Disposition: 01-Home or Self Care Therapy Recs:               Home health PT OT Home Equipment:         None Follow Up:  Follow-up Information    Garvin Fila, MD. Schedule an appointment as soon as possible for a visit in 6 week(s).   Specialties:  Neurology, Radiology Contact information: [address] [place] [address] [phone]          Clerance Lav, PA-C -PCP Follow up in 1-2 weeks     ATTENDING NOTE: I reviewed above note and agree with the assessment and plan. I have made any additions or clarifications directly to the above note. Pt was seen and examined.   47-year-old female with history of diabetes, Graves' disease status post RIA, hypertension, pituitary macroadenoma admitted for right-sided weakness.  MRI showed stable pituitary macroadenoma, however left ACA, MCA/ACA scattered infarcts.  MRI showed left VA slow flow.  CTA head and neck showed right ICA proximal high-grade stenosis left proximal ICA  50% stenosis, left VA V3/V4 occlusion.  Carotid Doppler indicated left ICA 40-59% stenosis right 60-79% stenosis.  EF 65-70%.  A1c 7.4, LDL 160.  Patient stroke could be artery to artery emboli from left ICA due to vascular atherosclerosis and risk factors including HLD and  DM.  However, cardioembolic cannot be ruled out.  Recommend 30-day cardiac event monitoring as outpatient.  Agree with vascular surgery consultation  to consider carotid revascularization.  Patient also complains of leg swelling and pain, both to LE venous Doppler to rule out DVT.  Aspirin increased from 81 to 325, and pravastatin changed to Crestor.  Patient educated on risk factor modification.  Will follow.  Rosalin Hawking, MD PhD Stroke Neurology 02/24/17 6:39 PM      To contact Stroke Continuity provider, please refer to [email or web address] After hours, contact General Neurology

## 2017-02-24 NOTE — Discharge Summary (Signed)
Physician Discharge Summary  Amanda Curtis FYB:[id number] DOB: Jul 07, 1935 DOA: 02/22/2017  PCP: Clerance Lav, PA-C  Admit date: 02/22/2017 Discharge date: 02/24/2017  Time spent: 35 minutes  Recommendations for Outpatient Follow-up:  1. Increase asa 81--325, increase pravachol 40-->80, replaced tradjenta with gluburide low dose-titrate as an OP 2. Need sldl and a1c in 2 mo 3. Follow with dr. Bridgett Larsson [facility] re: carotid stenosis on 03/01/17-appt. will be given if patient calls 4. Check TSH in 1 mo  Discharge Diagnoses:  Principal Problem:   Stroke (cerebrum) ([place]) Active Problems:   Essential hypertension   Hypothyroidism   Coronary artery disease   S/P CABG x 4   Hyperlipidemia   CKD (chronic kidney disease), stage III (HCC)   Type II diabetes mellitus with renal manifestations [facility])   Discharge Condition: improved  Diet recommendation: hh low salt  Filed Weights   02/22/17 1400 02/22/17 2205 02/23/17 0500  Weight: 72.8 kg (160 lb 7.9 oz) 73.8 kg (162 lb 11.2 oz) 73.9 kg (162 lb 14.7 oz)    History of present illness:  81 fem htn, hld, dm ty ii, graves dx s/p RAI-hypothy, Pit tumour, CAd s/p CABG x 4 07/09/13, CKD 4 Admit 12/27 after sudden R sided weakness 9 pm 12/26-MRI showed slow flow -CTA head and neck no critical stenosis   Hospital Course:  Embolic L MCA infarct-stroke work-up pending-awaiting echo-carotid US shows consideration for ICA stenosis causing CVA-Dr. Bridgett Larsson of [facility] was consulted and he will follow up with patient on 03/01/17 if patient calls office to set-up CAd s/p CABG x 4-increased ASA 81->325 per neuro. not on BB CKD iii-baseline Cr 1-1.4. Currently 1.1. conisder change Hygroton->hctz and adding BB, cut back losartan 50--25 Reflux-add Protonix 4 daily Graves s/p RAI-cont Synthroid 112-recheck TSH 1 mo Dm ty ii-A1c 7.4 this admit not on home meds--Started Glybruide low dsoe--will need titration as OP hld-LDL 160, Ttal 226.  Needs  statin--change to high intensity dosing pravachol 80 daily   Procedures:  Mri, ct,carotids and echo  Consultations:  Neuro  Phone consult Dr. Bridgett Larsson vascular  Discharge Exam: Vitals:   02/24/17 0550 02/24/17 1037  BP: 136/89 128/79  Pulse: (!) 58 62  Resp: 16 17  Temp: 98 F (36.7 C) (!) 97.5 F (36.4 C)  SpO2: 96% 99%    Vision ok shoulder shrug intact, uvula nv, tongue midlines, no facial twist weaker R UE s1 s2 no m/r/g abd soft nt nd  cta b Skin benign Power LE R slightly weaker than L Plantars down going   Discharge Instructions   Discharge Instructions    Ambulatory referral to Neurology   Complete by:  As directed    An appointment is requested in approximately: 6 weeks Follow up with stroke clinic (Dr Leonie Man preferred, if not available, then consider [facility] Chestnut, [facility] or Jaynee Eagles whoever is available) at [facility] in about 6-8 weeks. Thanks.     Allergies as of 02/24/2017      Reactions   Metformin Other (See Comments)   Ampicillin Other (See Comments)   rash   Atorvastatin Other (See Comments)   Severe muscle cramping Severe muscle cramping   Codeine Other (See Comments)   dizziness dizziness   Elavil [amitriptyline] Other (See Comments)   Tongue swelling Tongue swelling   Hctz [hydrochlorothiazide] Other (See Comments)   Severe leg cramps Severe leg cramps Severe leg cramps   Hytrin [terazosin] Other (See Comments)   syncope   Melatonin Other (See Comments)  Headaches   Trazodone Other (See Comments)   Wasp Venom Other (See Comments)   Hand swelling Hand swelling   Erythromycin Rash      Medication List    STOP taking these medications   losartan 50 MG tablet Commonly known as:  COZAAR     TAKE these medications   acetaminophen 325 MG tablet Commonly known as:  TYLENOL Take 2 tablets (650 mg total) by mouth every 6 (six) hours as needed for mild pain.   aspirin 325 MG EC tablet Take 1 tablet (325 mg total) by mouth daily. Start  taking on:  02/25/2017 What changed:    medication strength  how much to take   chlorthalidone 25 MG tablet Commonly known as:  HYGROTON Take 1 tablet (25 mg total) by mouth daily.   glyBURIDE 2.5 MG tablet Commonly known as:  DIABETA Take 1 tablet (2.5 mg total) by mouth daily with breakfast.   levothyroxine 112 MCG tablet Commonly known as:  SYNTHROID, LEVOTHROID Take 1 tablet (112 mcg total) by mouth daily before breakfast.   pravastatin 80 MG tablet Commonly known as:  PRAVACHOL Take 0.5 tablets (40 mg total) by mouth every evening. What changed:  medication strength   zolpidem 5 MG tablet Commonly known as:  AMBIEN Take 5 mg by mouth as needed for sleep.      Allergies  Allergen Reactions  . Metformin Other (See Comments)  . Ampicillin Other (See Comments)    rash  . Atorvastatin Other (See Comments)    Severe muscle cramping Severe muscle cramping  . Codeine Other (See Comments)    dizziness dizziness  . Elavil [Amitriptyline] Other (See Comments)    Tongue swelling Tongue swelling  . Hctz [Hydrochlorothiazide] Other (See Comments)    Severe leg cramps Severe leg cramps Severe leg cramps  . Hytrin [Terazosin] Other (See Comments)    syncope  . Melatonin Other (See Comments)    Headaches  . Trazodone Other (See Comments)  . Wasp Venom Other (See Comments)    Hand swelling Hand swelling  . Erythromycin Rash   Follow-up Information    Garvin Fila, MD. Schedule an appointment as soon as possible for a visit in 6 week(s).   Specialties:  Neurology, Radiology Contact information: [address] [place] [address] [phone]            The results of significant diagnostics from this hospitalization (including imaging, microbiology, ancillary and laboratory) are listed below for reference.    Significant Diagnostic Studies: Ct Angio Head W Or Wo Contrast  Result Date: 02/22/2017 CLINICAL DATA:  Follow-up acute LEFT MCA  territory infarcts. History of pituitary macroadenoma, diabetes and Grave's disease. EXAM: CT ANGIOGRAPHY HEAD AND NECK TECHNIQUE: Multidetector CT imaging of the head and neck was performed using the standard protocol during bolus administration of intravenous contrast. Multiplanar CT image reconstructions and MIPs were obtained to evaluate the vascular anatomy. Carotid stenosis measurements (when applicable) are obtained utilizing NASCET criteria, using the distal internal carotid diameter as the denominator. CONTRAST:  44mL ISOVUE-370 IOPAMIDOL (ISOVUE-370) INJECTION 76% COMPARISON:  MRI/MRA head February 22, 2017 at 2023 hours FINDINGS: CT HEAD: BRAIN: No intraparenchymal hemorrhage, mass effect nor midline shift. The ventricles and sulci are normal for age. Patchy supratentorial white matter hypodensities within normal range for patient's age, though non-specific are most compatible with chronic small vessel ischemic disease. No acute large vascular territory infarcts. No abnormal extra-axial fluid collections. Basal cisterns are patent. VASCULAR: Moderate  calcific atherosclerosis of the carotid siphons. SKULL: No skull fracture. No significant scalp soft tissue swelling. SINUSES/ORBITS: The mastoid air-cells and included paranasal sinuses are well-aerated.The included ocular globes and orbital contents are non-suspicious. OTHER: None. CTA NECK AORTIC ARCH: Normal 3.6 cm ascending aorta, 2 vessel arch is a normal variant. Moderate intimal thickening calcific atherosclerosis aortic arch. Moderate stenosis LEFT subclavian artery origin. Severe stenosis mid LEFT subclavian artery. Mild stenosis RIGHT subclavian artery origin. RIGHT CAROTID SYSTEM: Common carotid artery is widely patent, mildly tortuous course and intimal thickening. Calcific atherosclerosis resulting an 4 mm segment critical stenosis RIGHT ICA origin. 3 mm segment 60% stenosis RIGHT internal carotid artery 1 cm from the origin. LEFT CAROTID  SYSTEM: Common carotid artery is widely patent, coursing in a straight line fashion. 5 mm segment LEFT ICA origin 50% stenosis by NASCET criteria. Cervical internal carotid artery is patent. VERTEBRAL ARTERIES:RIGHT vertebral artery is dominant. Calcific atherosclerosis resulting in mild stenosis RIGHT vertebral artery origin. Severe stenosis LEFT vertebral artery origin. Gradual decreased contrast opacification than LEFT P2 segment, occluded LEFT V3 segment and V4 segment with distal reconstitution. SKELETON: No acute osseous process though bone windows have not been submitted. Advanced degenerative changes cervical spine. Moderate to severe LEFT C3-4, bilateral C4-5, severe bilateral C5-6 and moderate to severe bilateral C6-7 neural foraminal narrowing. OTHER NECK: Soft tissues of the neck are nonacute though, not tailored for evaluation. Status post thyroidectomy. UPPER CHEST: Included lung apices are clear. No superior mediastinal lymphadenopathy. Status post median sternotomy for CABG. CTA HEAD ANTERIOR CIRCULATION: Patent cervical internal carotid arteries, petrous, cavernous and supra clinoid internal carotid arteries. Patent anterior communicating artery. Patent anterior and middle cerebral arteries. No large vessel occlusion, significant stenosis, contrast extravasation or aneurysm. POSTERIOR CIRCULATION: Occluded LEFT vertebral artery with eccentric calcific atherosclerosis. Distal retrograde flow in reconstitution. Patent RIGHT vertebral artery with mild calcific atherosclerosis. Mild stenosis proximal basilar artery, patent. Patent main branch vessels. Patent though small bilateral posterior cerebral artery's, mild luminal irregularity compatible with atherosclerosis. Moderate stenosis distal bilateral P2 segments. No large vessel occlusion, significant stenosis, contrast extravasation or aneurysm. VENOUS SINUSES: Major dural venous sinuses are patent though not tailored for evaluation on this  angiographic examination. ANATOMIC VARIANTS: None. DELAYED PHASE: Enhancing pituitary macroadenoma. MIP images reviewed. IMPRESSION: CT HEAD: 1. Small multifocal LEFT MCA territory infarcts better seen on today's MRI. No hemorrhagic conversion. 2. Moderate to severe chronic small vessel ischemic disease. Old RIGHT basal ganglia infarct. 3. No pituitary macroadenoma better demonstrated by MRI. CTA NECK: 1. Short segment critical stenosis RIGHT ICA origin with tandem 60% stenosis. 2. 50% stenosis LEFT internal carotid artery origin. 3. Occluded V3 and V4 LEFT vertebral artery with distal V4 reconstitution. 4. Severe stenosis LEFT mid subclavian artery. CTA HEAD: 1. No emergent large vessel occlusion or severe stenosis. 2. Intracranial atherosclerosis, moderate stenosis bilateral distal P2 segments. Aortic Atherosclerosis (ICD10-I70.0). Electronically Signed   By: Elon Alas M.D.   On: 02/22/2017 22:21   Ct Angio Neck W And/or Wo Contrast  Result Date: 02/22/2017 CLINICAL DATA:  Follow-up acute LEFT MCA territory infarcts. History of pituitary macroadenoma, diabetes and Grave's disease. EXAM: CT ANGIOGRAPHY HEAD AND NECK TECHNIQUE: Multidetector CT imaging of the head and neck was performed using the standard protocol during bolus administration of intravenous contrast. Multiplanar CT image reconstructions and MIPs were obtained to evaluate the vascular anatomy. Carotid stenosis measurements (when applicable) are obtained utilizing NASCET criteria, using the distal internal carotid diameter as the denominator. CONTRAST:  21mL ISOVUE-370  IOPAMIDOL (ISOVUE-370) INJECTION 76% COMPARISON:  MRI/MRA head February 22, 2017 at 2023 hours FINDINGS: CT HEAD: BRAIN: No intraparenchymal hemorrhage, mass effect nor midline shift. The ventricles and sulci are normal for age. Patchy supratentorial white matter hypodensities within normal range for patient's age, though non-specific are most compatible with chronic small  vessel ischemic disease. No acute large vascular territory infarcts. No abnormal extra-axial fluid collections. Basal cisterns are patent. VASCULAR: Moderate calcific atherosclerosis of the carotid siphons. SKULL: No skull fracture. No significant scalp soft tissue swelling. SINUSES/ORBITS: The mastoid air-cells and included paranasal sinuses are well-aerated.The included ocular globes and orbital contents are non-suspicious. OTHER: None. CTA NECK AORTIC ARCH: Normal 3.6 cm ascending aorta, 2 vessel arch is a normal variant. Moderate intimal thickening calcific atherosclerosis aortic arch. Moderate stenosis LEFT subclavian artery origin. Severe stenosis mid LEFT subclavian artery. Mild stenosis RIGHT subclavian artery origin. RIGHT CAROTID SYSTEM: Common carotid artery is widely patent, mildly tortuous course and intimal thickening. Calcific atherosclerosis resulting an 4 mm segment critical stenosis RIGHT ICA origin. 3 mm segment 60% stenosis RIGHT internal carotid artery 1 cm from the origin. LEFT CAROTID SYSTEM: Common carotid artery is widely patent, coursing in a straight line fashion. 5 mm segment LEFT ICA origin 50% stenosis by NASCET criteria. Cervical internal carotid artery is patent. VERTEBRAL ARTERIES:RIGHT vertebral artery is dominant. Calcific atherosclerosis resulting in mild stenosis RIGHT vertebral artery origin. Severe stenosis LEFT vertebral artery origin. Gradual decreased contrast opacification than LEFT P2 segment, occluded LEFT V3 segment and V4 segment with distal reconstitution. SKELETON: No acute osseous process though bone windows have not been submitted. Advanced degenerative changes cervical spine. Moderate to severe LEFT C3-4, bilateral C4-5, severe bilateral C5-6 and moderate to severe bilateral C6-7 neural foraminal narrowing. OTHER NECK: Soft tissues of the neck are nonacute though, not tailored for evaluation. Status post thyroidectomy. UPPER CHEST: Included lung apices are clear.  No superior mediastinal lymphadenopathy. Status post median sternotomy for CABG. CTA HEAD ANTERIOR CIRCULATION: Patent cervical internal carotid arteries, petrous, cavernous and supra clinoid internal carotid arteries. Patent anterior communicating artery. Patent anterior and middle cerebral arteries. No large vessel occlusion, significant stenosis, contrast extravasation or aneurysm. POSTERIOR CIRCULATION: Occluded LEFT vertebral artery with eccentric calcific atherosclerosis. Distal retrograde flow in reconstitution. Patent RIGHT vertebral artery with mild calcific atherosclerosis. Mild stenosis proximal basilar artery, patent. Patent main branch vessels. Patent though small bilateral posterior cerebral artery's, mild luminal irregularity compatible with atherosclerosis. Moderate stenosis distal bilateral P2 segments. No large vessel occlusion, significant stenosis, contrast extravasation or aneurysm. VENOUS SINUSES: Major dural venous sinuses are patent though not tailored for evaluation on this angiographic examination. ANATOMIC VARIANTS: None. DELAYED PHASE: Enhancing pituitary macroadenoma. MIP images reviewed. IMPRESSION: CT HEAD: 1. Small multifocal LEFT MCA territory infarcts better seen on today's MRI. No hemorrhagic conversion. 2. Moderate to severe chronic small vessel ischemic disease. Old RIGHT basal ganglia infarct. 3. No pituitary macroadenoma better demonstrated by MRI. CTA NECK: 1. Short segment critical stenosis RIGHT ICA origin with tandem 60% stenosis. 2. 50% stenosis LEFT internal carotid artery origin. 3. Occluded V3 and V4 LEFT vertebral artery with distal V4 reconstitution. 4. Severe stenosis LEFT mid subclavian artery. CTA HEAD: 1. No emergent large vessel occlusion or severe stenosis. 2. Intracranial atherosclerosis, moderate stenosis bilateral distal P2 segments. Aortic Atherosclerosis (ICD10-I70.0). Electronically Signed   By: Elon Alas M.D.   On: 02/22/2017 22:21   Mr Jodene Nam Head  Wo Contrast  Result Date: 02/22/2017 CLINICAL DATA:  Leg weakness, fell yesterday. History  of pituitary tumor, hypertension and diabetes. EXAM: MRI HEAD WITHOUT CONTRAST MRA HEAD WITHOUT CONTRAST TECHNIQUE: Multiplanar, multiecho pulse sequences of the brain and surrounding structures were obtained without intravenous contrast. Angiographic images of the head were obtained using MRA technique without contrast. COMPARISON:  MRI of the head November 01, 2016 FINDINGS: MRI HEAD FINDINGS BRAIN: Subcentimeter foci of reduced diffusion LEFT frontal and a lesser extent LEFT parietal lobes involving the cortex, white matter as well and LEFT caudate body. Identifiable lesions demonstrate low ADC values. Punctate reduced diffusion versus artifact LEFT mesial parietal lobe. Scattered chronic micro hemorrhages predominately peripheral distribution, most commonly seen with chronic hypertension. Moderate parenchymal brain volume loss. No hydrocephalus. Patchy to confluent supratentorial white matter FLAIR T2 hyperintensities. Old small LEFT cerebellar infarcts. Old bilateral basal ganglia lacunar infarcts. No midline shift, mass effect or intraparenchymal masses. VASCULAR: Worsening intermediate signal LEFT vertebral artery as characterized below . SKULL AND UPPER CERVICAL SPINE: Re- demonstration of 11 x 14 x 15 mm sella/suprasellar mass most consistent with macroadenoma. Mildly heterogeneous lead bright T1 bone marrow signal seen with osteopenia. Craniocervical junction maintained. SINUSES/ORBITS: The mastoid air-cells and included paranasal sinuses are well-aerated. The included ocular globes and orbital contents are non-suspicious. OTHER: None. MRA HEAD FINDINGS ANTERIOR CIRCULATION: Normal flow related enhancement of the included cervical, petrous, cavernous and supraclinoid internal carotid arteries. Mild luminal irregularity RIGHT internal carotid artery compatible with atherosclerosis. Patent anterior communicating  artery. Patent anterior and middle cerebral arteries. Mild luminal irregularity RIGHT M1 segment compatible with atherosclerosis. No large vessel occlusion, flow limiting stenosis, aneurysm. POSTERIOR CIRCULATION: Poor flow related enhancement LEFT vertebral artery, potentially occluded in the neck with reconstitution distally. Patent RIGHT vertebral artery. Basilar artery is patent, with normal flow related enhancement of the main branch vessels. Patent posterior cerebral arteries. No large vessel occlusion, flow limiting stenosis,  aneurysm. ANATOMIC VARIANTS: None. Source images and MIP images were reviewed. IMPRESSION: MRI HEAD: 1. Multiple subcentimeter infarcts LEFT MCA territory seen with embolic phenomena or, carotid artery stenosis. 2. Moderate to severe chronic small vessel ischemic disease and old lacunar infarcts. 3. Stable appearance of pituitary macroadenoma. MRA HEAD: 1. No emergent large vessel occlusion. 2. Slow flow LEFT vertebral artery suggesting proximal stenosis or occlusion with reconstitution. 3. Mild atherosclerosis. 4. Recommend CTA neck for further evaluation. Critical Value/emergent results were called by telephone at the time of interpretation on 02/22/2017 at 8:40 pm to Dr. Duffy Bruce , who verbally acknowledged these results. Electronically Signed   By: Elon Alas M.D.   On: 02/22/2017 20:44   Mr Brain Wo Contrast  Result Date: 02/22/2017 CLINICAL DATA:  Leg weakness, fell yesterday. History of pituitary tumor, hypertension and diabetes. EXAM: MRI HEAD WITHOUT CONTRAST MRA HEAD WITHOUT CONTRAST TECHNIQUE: Multiplanar, multiecho pulse sequences of the brain and surrounding structures were obtained without intravenous contrast. Angiographic images of the head were obtained using MRA technique without contrast. COMPARISON:  MRI of the head November 01, 2016 FINDINGS: MRI HEAD FINDINGS BRAIN: Subcentimeter foci of reduced diffusion LEFT frontal and a lesser extent LEFT  parietal lobes involving the cortex, white matter as well and LEFT caudate body. Identifiable lesions demonstrate low ADC values. Punctate reduced diffusion versus artifact LEFT mesial parietal lobe. Scattered chronic micro hemorrhages predominately peripheral distribution, most commonly seen with chronic hypertension. Moderate parenchymal brain volume loss. No hydrocephalus. Patchy to confluent supratentorial white matter FLAIR T2 hyperintensities. Old small LEFT cerebellar infarcts. Old bilateral basal ganglia lacunar infarcts. No midline shift, mass effect or intraparenchymal masses.  VASCULAR: Worsening intermediate signal LEFT vertebral artery as characterized below . SKULL AND UPPER CERVICAL SPINE: Re- demonstration of 11 x 14 x 15 mm sella/suprasellar mass most consistent with macroadenoma. Mildly heterogeneous lead bright T1 bone marrow signal seen with osteopenia. Craniocervical junction maintained. SINUSES/ORBITS: The mastoid air-cells and included paranasal sinuses are well-aerated. The included ocular globes and orbital contents are non-suspicious. OTHER: None. MRA HEAD FINDINGS ANTERIOR CIRCULATION: Normal flow related enhancement of the included cervical, petrous, cavernous and supraclinoid internal carotid arteries. Mild luminal irregularity RIGHT internal carotid artery compatible with atherosclerosis. Patent anterior communicating artery. Patent anterior and middle cerebral arteries. Mild luminal irregularity RIGHT M1 segment compatible with atherosclerosis. No large vessel occlusion, flow limiting stenosis, aneurysm. POSTERIOR CIRCULATION: Poor flow related enhancement LEFT vertebral artery, potentially occluded in the neck with reconstitution distally. Patent RIGHT vertebral artery. Basilar artery is patent, with normal flow related enhancement of the main branch vessels. Patent posterior cerebral arteries. No large vessel occlusion, flow limiting stenosis,  aneurysm. ANATOMIC VARIANTS: None.  Source images and MIP images were reviewed. IMPRESSION: MRI HEAD: 1. Multiple subcentimeter infarcts LEFT MCA territory seen with embolic phenomena or, carotid artery stenosis. 2. Moderate to severe chronic small vessel ischemic disease and old lacunar infarcts. 3. Stable appearance of pituitary macroadenoma. MRA HEAD: 1. No emergent large vessel occlusion. 2. Slow flow LEFT vertebral artery suggesting proximal stenosis or occlusion with reconstitution. 3. Mild atherosclerosis. 4. Recommend CTA neck for further evaluation. Critical Value/emergent results were called by telephone at the time of interpretation on 02/22/2017 at 8:40 pm to Dr. Duffy Bruce , who verbally acknowledged these results. Electronically Signed   By: Elon Alas M.D.   On: 02/22/2017 20:44    Microbiology: No results found for this or any previous visit (from the past 240 hour(s)).   Labs: Basic Metabolic Panel: Recent Labs  Lab 02/22/17 1426 02/22/17 2111  NA 137 141  K 4.2 3.6  CL 103 108  CO2 23  --   GLUCOSE 120* 103*  BUN 30* 24*  CREATININE 1.43* 1.10*  CALCIUM 9.5  --    Liver Function Tests: No results for input(s): AST, ALT, ALKPHOS, BILITOT, PROT, ALBUMIN in the last 168 hours. No results for input(s): LIPASE, AMYLASE in the last 168 hours. No results for input(s): AMMONIA in the last 168 hours. CBC: Recent Labs  Lab 02/22/17 1426 02/22/17 2111  WBC 9.4  --   NEUTROABS 6.4  --   HGB 12.5 12.2  HCT 40.2 36.0  MCV 89.7  --   PLT 490*  --    Cardiac Enzymes: Recent Labs  Lab 02/23/17 0307  CKTOTAL 66   BNP: BNP (last 3 results) No results for input(s): BNP in the last 8760 hours.  ProBNP (last 3 results) No results for input(s): PROBNP in the last 8760 hours.  CBG: Recent Labs  Lab 02/22/17 1411 02/22/17 2239 02/23/17 0636  GLUCAP 121* 92 103*       Signed:  Nita Sells MD   [facility] 02/24/2017, 12:39 PM

## 2017-02-24 NOTE — Progress Notes (Signed)
Discharge instructions reviewed with patient.  These included the following:  Medications, prescriptions, MD f/u apt., activity restrictions, HH instructions and phone number, when to call the MD, and specific PT / OT instructions.  Dietary instructions also included due to concern re:  Increased Hgb A1C.  Awaiting family for transport home.

## 2017-02-24 NOTE — Plan of Care (Signed)
Pt restimg quietly in bed, VSS, NAD. No acute issues, will monitor.

## 2017-02-24 NOTE — Progress Notes (Signed)
Physical Therapy Treatment Patient Details Name: Amanda Curtis MRN: [id number] DOB: 08/16/1935 Today's Date: 02/24/2017    History of Present Illness 81 y.o. female presenting with right-sided weakness and numbness.  Brain MRI indicates scattered strokes in left MCA distribution. Neck CT showing stenosis of right ICA, left ICA, and left mid-subclavian artery as well as occluded left V3 and V4. PMH significant of HTN, HLD, diet-controlled DM, hypothyroidism, pituitiary tumor, insomnia, Graves disease, CAD, CABG, and CKD 3.    PT Comments    Patient progressing well with mobility, remains limited with RUE functional use. At this time, feel patient will be safe for d/c home but continue to recommend follow up as indicated.    Follow Up Recommendations  Outpatient PT     Equipment Recommendations  None recommended by PT    Recommendations for Other Services       Precautions / Restrictions Precautions Precautions: Fall Restrictions Weight Bearing Restrictions: No    Mobility  Bed Mobility               General bed mobility comments: received in recliner  Transfers Overall transfer level: Needs assistance Equipment used: None Transfers: Sit to/from Stand;Stand Pivot Transfers Sit to Stand: Modified independent (Device/Increase time) Stand pivot transfers: Min guard       General transfer comment: no physical assist required, able to perform from chair and from bed  Ambulation/Gait Ambulation/Gait assistance: Independent Ambulation Distance (Feet): 310 Feet Assistive device: None Gait Pattern/deviations: Step-to pattern;Wide base of support;Decreased stride length Gait velocity: slightly decreased Gait velocity interpretation: Below normal speed for age/gender General Gait Details: modest instability noted with ambulation, overall able to self correct no Overt LOB   Stairs Stairs: Yes   Stair Management: Two rails Number of Stairs: 3 General stair  comments: no physical assist required, supervision for safety  Wheelchair Mobility    Modified Rankin (Stroke Patients Only) Modified Rankin (Stroke Patients Only) Pre-Morbid Rankin Score: No symptoms Modified Rankin: Moderate disability     Balance Overall balance assessment: Needs assistance Sitting-balance support: No upper extremity supported;Feet supported Sitting balance-Leahy Scale: Good Sitting balance - Comments: Pt sits EOB and in chair with ability to weight-shift and use UEs freely. No LOB during MMT of UE or LE.      Standing balance-Leahy Scale: Good Standing balance comment: Pt able to stand statically without external support. Pt demonstrates dynamic balance in turning completing 180 turns without LOB (min guard) and picking up object from floor (supervision).                             Cognition Arousal/Alertness: Awake/alert Behavior During Therapy: WFL for tasks assessed/performed Overall Cognitive Status: Within Functional Limits for tasks assessed                                        Exercises      General Comments        Pertinent Vitals/Pain Pain Assessment: No/denies pain    Home Living                      Prior Function            PT Goals (current goals can now be found in the care plan section) Acute Rehab PT Goals Patient Stated Goal: to go home PT Goal Formulation:  With patient Time For Goal Achievement: 03/09/17 Potential to Achieve Goals: Good Progress towards PT goals: Progressing toward goals    Frequency    Min 4X/week      PT Plan Current plan remains appropriate    Co-evaluation              AM-PAC PT "6 Clicks" Daily Activity  Outcome Measure  Difficulty turning over in bed (including adjusting bedclothes, sheets and blankets)?: A Little Difficulty moving from lying on back to sitting on the side of the bed? : A Little Difficulty sitting down on and standing up from  a chair with arms (e.g., wheelchair, bedside commode, etc,.)?: A Little Help needed moving to and from a bed to chair (including a wheelchair)?: A Little Help needed walking in hospital room?: A Little Help needed climbing 3-5 steps with a railing? : A Little 6 Click Score: 18    End of Session Equipment Utilized During Treatment: Gait belt Activity Tolerance: Patient tolerated treatment well Patient left: in chair;with call bell/phone within reach;with family/visitor present Nurse Communication: Mobility status PT Visit Diagnosis: Other symptoms and signs involving the nervous system (R29.898);Difficulty in walking, not elsewhere classified (R26.2)     Time: 1354-1410 PT Time Calculation (min) (ACUTE ONLY): 16 min  Charges:  $Gait Training: 8-22 mins                    G Codes:       Alben Deeds, PT DPT  Board Certified Neurologic Specialist [place] 02/24/2017, 2:17 PM

## 2017-03-02 ENCOUNTER — Other Ambulatory Visit: Payer: Self-pay | Admitting: *Deleted

## 2017-03-02 NOTE — Patient Outreach (Signed)
[facility]) Care Management  03/02/2017  Amanda Curtis May 02, 1935 [id number]  Referral via EMMI-Stroke-Red Alert Day #3, 03/01/2017; Reason: Feeling worse overall ? "yes"  Per hx review: Admission; 12/27-12/29/2018 DX: Stroke (cerebrum)  Telephone call to patient who was advised of reason for call.  HIPPA verification received from patient.  Patient voices that she does not remember receiving automated call and states she does not feel worse since hospital discharge. States she was in hospital for 2 days & was diagnosed with stroke.   Patient voices that she had symptoms of weakness, inability to walk, rt arm weakness. States she did not get medical help till next day because she thought she was having symptoms caused by her pituitary tumor. Voices now she knows they were signs of stroke and knows to call 911 for those symptoms. States she has list of stroke symptoms that she received upon discharge and will call if any occur. States she is walking now without any assistance & regaining her strength. States she still has weakness of right hand & drags rt foot. States she has home health physical & occupational therapy in place.  Voices that she has appointment with neurologist. Advised patient of importance of setting up hospital follow up appointment with primary care provider with in 2 weeks of discharge. Patient voices understanding and states she will call to make appointment. States she has all of medications & is taking as prescribed by MD instructions.   EMMI call & has been addressed.  Patient has no further concerns.   Plan: Send to care management assistant to close out.  Sherrin Daisy, RN BSN [place] Management Coordinator [facility] Care Management  [phone]

## 2017-03-08 ENCOUNTER — Other Ambulatory Visit: Payer: Self-pay | Admitting: *Deleted

## 2017-03-09 NOTE — Patient Outreach (Signed)
[facility]) Care Management  03/09/2017  FATOU DUNNIGAN 1936-01-09 [id number]  Referral via EMMI-Stroke -Red Alert Day#9, 03/07/2017; Reason: Sad, hopeless, anxious, empty-yes  Telephone call to patient; HIPPA verification received. Patient voices that she has been feeling sad & anxious since her stroke. States she is also having trouble with her meniscus and questions  why all of this is happening to her and she is asking "why me?" States she has faith in God and is depending on her faith to get her through this challenging time. States she does have contact with her church friends and they offer her support & prayers. States she has not been sleeping well and plans to talk to her MD to get renewal on her sleeping medication. Patient encouraged to talk to her MD about her emotions & sadness. States her hospital follow up appointment is scheduled for 01/17 and grandson will provide transportation.  Patient offered referral to Social Worker to talk to and advise of community resources for emotional support. States she did not need that support at this time. States she will depend on her faith to help her.   Voices that she continues to receive  home health & is getting around okay. States she has had no falls. Voices that she has not had any further stroke symptoms. Voices she will call 911 it stroke symptoms occur.  Patient voices no further concerns & thanked this RN care coordinator for calling.   Plan: Send to care management assistant for case closure.  Sherrin Daisy, RN BSN [place] Management Coordinator [facility] Care Management  [phone]

## 2017-03-12 ENCOUNTER — Other Ambulatory Visit: Payer: Self-pay | Admitting: *Deleted

## 2017-03-12 NOTE — Patient Outreach (Signed)
[facility]) Care Management  03/12/2017  Amanda Curtis September 22, 1935 [id number]  Referral via EMMI-Stroke-Red Alert-Day# 13. I/13/2019;  Reason: Amanda Curtis to follow up appointment-"no"  This RN care coordinator spoke with patient 03/09/2017 and patient advised that she has appointment for hospital follow up with primary care provider on 1/17 which is Thursday of this week. Patient voiced at that time she has transportation to appointment.   Follow up appointment has already been addressed on previous call.  Plan: Send to care management to close out.  Sherrin Daisy, RN BSN [place] Management Coordinator [facility] Care Management  [phone]

## 2017-03-30 ENCOUNTER — Ambulatory Visit: Payer: Medicare Other | Admitting: Vascular Surgery

## 2017-03-30 ENCOUNTER — Encounter: Payer: Self-pay | Admitting: Vascular Surgery

## 2017-03-30 VITALS — BP 147/85 | HR 64 | Temp 98.4°F | Resp 16 | Ht 64.0 in | Wt 158.0 lb

## 2017-03-30 DIAGNOSIS — I2510 Atherosclerotic heart disease of native coronary artery without angina pectoris: Secondary | ICD-10-CM

## 2017-03-30 DIAGNOSIS — I63132 Cerebral infarction due to embolism of left carotid artery: Secondary | ICD-10-CM

## 2017-03-30 NOTE — Progress Notes (Signed)
New Carotid Patient  Requested by:  Clerance Lav, PA-C [phone] [place],  [id number]  Reason for consultation: right carotid stenosis   History of Present Illness   Amanda Curtis is a 82 y.o. (October 04, 1935) female with known pituitary tumor who presents with chief complaint: recent stroke.  Patient was admitted to hospital on 02/22/18 after having a left side stroke.  She had sudden onset of right side weakness with difficulty walking.  Her Brain MRI was consistent with embolic stroke to L brain.  On CTA Neck: RICA with critical stenosis with noted, L ICA had 50% stenosis.  Patient has no prior history of TIA or stroke symptom.  The patient has never had amaurosis fugax or monocular blindness.  The patient has never had facial drooping.  The patient has never had receptive or expressive aphasia.   The patient's previous neurologic deficits have resolved.  The patient's risks factors for carotid disease include: DM, HTN.  Past Medical History:  Diagnosis Date  . Arthritis   . Diabetes mellitus without complication ([place])   . Graves disease 2000   s/p RIA  . Hypertension   . Insomnia   . Pituitary tumor   . Vitamin D deficiency     Past Surgical History:  Procedure Laterality Date  . APPENDECTOMY    . CORONARY ARTERY BYPASS GRAFT N/A 07/04/2013   Procedure: CORONARY ARTERY BYPASS GRAFTING (CABG) times four using left internal mammary artery and right saphenous leg vein.;  Surgeon: Gaye Pollack, MD;  Location: [facility];  Service: Open Heart Surgery;  Laterality: N/A;  . INTRAOPERATIVE TRANSESOPHAGEAL ECHOCARDIOGRAM N/A 07/04/2013   Procedure: INTRAOPERATIVE TRANSESOPHAGEAL ECHOCARDIOGRAM;  Surgeon: Gaye Pollack, MD;  Location: [facility];  Service: Open Heart Surgery;  Laterality: N/A;  . LEFT HEART CATHETERIZATION WITH CORONARY ANGIOGRAM N/A 06/30/2013   Procedure: LEFT HEART CATHETERIZATION WITH CORONARY ANGIOGRAM;  Surgeon: Blane Ohara, MD;  Location: [facility]  CATH LAB;  Service: Cardiovascular;  Laterality: N/A;  . NASAL SEPTUM SURGERY     childhood    Social History   Socioeconomic History  . Marital status: Widowed    Spouse name: Not on file  . Number of children: Not on file  . Years of education: Not on file  . Highest education level: Not on file  Social Needs  . Financial resource strain: Not on file  . Food insecurity - worry: Not on file  . Food insecurity - inability: Not on file  . Transportation needs - medical: Not on file  . Transportation needs - non-medical: Not on file  Occupational History  . Not on file  Tobacco Use  . Smoking status: Never Smoker  . Smokeless tobacco: Never Used  Substance and Sexual Activity  . Alcohol use: No  . Drug use: No  . Sexual activity: Not on file  Other Topics Concern  . Not on file  Social History Narrative   Widow   Retired worked for [employer] and did a lot of travelling for [employer].   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   She has 1 son- lives in [place]   3 grandsons- college age (one is in [place])             Family History  Problem Relation Age of Onset  . Dementia Mother   . Cancer Mother  breast  . Diabetes Father   . Diabetes Paternal Grandmother   . Hypertension Paternal Grandmother     Current Outpatient Medications  Medication Sig Dispense Refill  . acetaminophen (TYLENOL) 325 MG tablet Take 2 tablets (650 mg total) by mouth every 6 (six) hours as needed for mild pain.    [name] aspirin 325 MG EC tablet Take 1 tablet (325 mg total) by mouth daily. 30 tablet 2  . chlorthalidone (HYGROTON) 25 MG tablet Take 1 tablet (25 mg total) by mouth daily. 90 tablet 1  . glyBURIDE (DIABETA) 2.5 MG tablet Take 1 tablet (2.5 mg total) by mouth daily with breakfast. 30 tablet 1  . levothyroxine (SYNTHROID, LEVOTHROID) 112 MCG tablet Take 1 tablet (112 mcg total) by mouth daily  before breakfast. 30 tablet 0  . pravastatin (PRAVACHOL) 80 MG tablet Take 0.5 tablets (40 mg total) by mouth every evening. 30 tablet 2  . zolpidem (AMBIEN) 5 MG tablet Take 5 mg by mouth as needed for sleep.     No current facility-administered medications for this visit.     Allergies  Allergen Reactions  . Metformin Other (See Comments)  . Ampicillin Other (See Comments)    rash  . Atorvastatin Other (See Comments)    Severe muscle cramping Severe muscle cramping  . Codeine Other (See Comments)    dizziness dizziness  . Elavil [Amitriptyline] Other (See Comments)    Tongue swelling Tongue swelling  . Hctz [Hydrochlorothiazide] Other (See Comments)    Severe leg cramps Severe leg cramps Severe leg cramps  . Hytrin [Terazosin] Other (See Comments)    syncope  . Melatonin Other (See Comments)    Headaches  . Trazodone Other (See Comments)  . Wasp Venom Other (See Comments)    Hand swelling Hand swelling  . Erythromycin Rash    REVIEW OF SYSTEMS (negative unless checked):   Cardiac:  []  Chest pain or chest pressure? []  Shortness of breath upon activity? []  Shortness of breath when lying flat? []  Irregular heart rhythm?  Vascular:  []  Pain in calf, thigh, or hip brought on by walking? []  Pain in feet at night that wakes you up from your sleep? []  Blood clot in your veins? []  Leg swelling?  Pulmonary:  []  Oxygen at home? []  Productive cough? []  Wheezing?  Neurologic:  [x]  Sudden weakness in arms or legs? []  Sudden numbness in arms or legs? []  Sudden onset of difficult speaking or slurred speech? []  Temporary loss of vision in one eye? []  Problems with dizziness?  Gastrointestinal:  []  Blood in stool? []  Vomited blood?  Genitourinary:  []  Burning when urinating? []  Blood in urine?  Psychiatric:  []  Major depression  Hematologic:  []  Bleeding problems? []  Problems with blood clotting?  Dermatologic:  []  Rashes or ulcers?  Constitutional:    []  Fever or chills?  Ear/Nose/Throat:  []  Change in hearing? []  Nose bleeds? []  Sore throat?  Musculoskeletal:  []  Back pain? []  Joint pain? []  Muscle pain?   For VQI Use Only   PRE-ADM LIVING Home  AMB STATUS Ambulatory  CAD Sx None  PRIOR CHF None  STRESS TEST No    Physical Examination     Vitals:   03/30/17 1107 03/30/17 1111  BP: (!) 159/91 (!) 147/85  Pulse: 64   Resp: 16   Temp: 98.4 F (36.9 C)   TempSrc: Oral   SpO2: 98%   Weight: 158 lb (71.7 kg)   Height: 5\' 4"  (1.626 m)  Body mass index is 27.12 kg/m.  General Alert, O x 3, WD, Elderly  Head [place]/AT,    Ear/Nose/ Throat Hearing grossly intact, nares without erythema or drainage, oropharynx without Erythema or Exudate, Mallampati score: 3,   Eyes PERRLA, EOMI,    Neck Supple, mid-line trachea,    Pulmonary Sym exp, good B air movt, CTA B  Cardiac RRR, Nl S1, S2, no Murmurs, No rubs, No S3,S4  Vascular Vessel Right Left  Radial Palpable Palpable  Brachial Palpable Palpable  Carotid Palpable, No Bruit Palpable, No Bruit  Aorta Not palpable N/A  Femoral Palpable Palpable  Popliteal Not palpable Not palpable  PT Palpable Palpable  DP Palpable Palpable    Gastro- intestinal soft, non-distended, non-tender to palpation, No guarding or rebound, no HSM, no masses, no CVAT B, No palpable prominent aortic pulse,    Musculo- skeletal M/S 5/5 throughout  , Extremities without ischemic changes  , No edema present, No visible varicosities , No Lipodermatosclerosis present  Neurologic Cranial nerves 2-12 intact , Pain and light touch intact in extremities , Motor exam as listed above, somewhat slowed speech  Psychiatric Judgement intact, Mood & affect appropriate for pt's clinical situation  Dermatologic See M/S exam for extremity exam, No rashes otherwise noted  Lymphatic  Palpable lymph nodes: None   Radiology     MRI/MRA Head (02/22/17) MRI HEAD:  1. Multiple subcentimeter infarcts LEFT MCA  territory seen with embolic phenomena or, carotid artery stenosis. 2. Moderate to severe chronic small vessel ischemic disease and old lacunar infarcts. 3. Stable appearance of pituitary macroadenoma.  MRA HEAD: 1. No emergent large vessel occlusion. 2. Slow flow LEFT vertebral artery suggesting proximal stenosis or occlusion with reconstitution. 3. Mild atherosclerosis. 4. Recommend CTA neck for further evaluation.  CT/CTA Head/Neck (02/22/17) CT HEAD:  1. Small multifocal LEFT MCA territory infarcts better seen on today's MRI. No hemorrhagic conversion. 2. Moderate to severe chronic small vessel ischemic disease. Old RIGHT basal ganglia infarct. 3. No pituitary macroadenoma better demonstrated by MRI.  CTA NECK:  1. Short segment critical stenosis RIGHT ICA origin with tandem 60% stenosis. 2. 50% stenosis LEFT internal carotid artery origin. 3. Occluded V3 and V4 LEFT vertebral artery with distal V4 reconstitution. 4. Severe stenosis LEFT mid subclavian artery.  CTA HEAD:  1. No emergent large vessel occlusion or severe stenosis. 2. Intracranial atherosclerosis, moderate stenosis bilateral distal P2 segments.  I reviewed this patient's CTA Neck, the L ICA appears to be <=50%.  It is difficult to measure given the small size of this patient's carotid artery.  There is no associated thrombus or plaque that would be an embolic source.  I agree the R ICA has a focal >90% stenosis.   Outside Studies/Documentation   10 pages of outside documents were reviewed including: recent admission.   Medical Decision Making   Amanda Curtis is a 82 y.o. female who presents with: prior R CVA, new L CVA, questionable sx L ICA stenosis 50%, asx R ICA stenosis >90%  By NASCET, you could argue for a L CEA, but I have my doubt that this 50% stenosis with no obvious ulceration is the source of this patient L CVA.  It obviously wasn't the source the prior R CVA. The asx R ICA stenosis  >90% meets criteria for R CEA.   I discussed with the patient the risks, benefits, and alternatives to carotid endarterectomy.   The patient is aware that the risks of carotid endarterectomy include but  are not limited to: bleeding, infection, stroke, myocardial infarction, death, cranial nerve injuries both temporary and permanent, neck hematoma, possible airway compromise, labile blood pressure post-operatively, cerebral hyperperfusion syndrome, and possible need for additional interventions in the future.  The patient is reluctant to proceed. The patient also has the pituitary adenoma to consider.   She has an appointment with Neurology this coming week so will have them review my opinion and give their two cents also. I discussed in depth with the patient the nature of atherosclerosis, and emphasized the importance of maximal medical management including strict control of blood pressure, blood glucose, and lipid levels, obtaining regular exercise, antiplatelet agents, and cessation of smoking.   The patient is currently on a statin: Pravachol.  The patient is currently on an anti-platelet: ASA.  The patient is aware that without maximal medical management the underlying atherosclerotic disease process will progress, limiting the benefit of any interventions.  Thank you for allowing [place] to participate in this patient's care.   Adele Barthel, MD, FACS [facility] Office: [phone] Pager: [phone]  03/30/2017, 11:50 AM

## 2017-04-26 ENCOUNTER — Ambulatory Visit: Payer: Self-pay | Admitting: Neurology

## 2017-04-27 ENCOUNTER — Ambulatory Visit: Payer: Medicare Other | Admitting: Cardiovascular Disease

## 2017-05-28 ENCOUNTER — Ambulatory Visit (INDEPENDENT_AMBULATORY_CARE_PROVIDER_SITE_OTHER): Payer: Medicare Other | Admitting: Cardiovascular Disease

## 2017-05-28 ENCOUNTER — Encounter: Payer: Self-pay | Admitting: Cardiovascular Disease

## 2017-05-28 VITALS — BP 128/80 | HR 66 | Ht 64.0 in | Wt 163.4 lb

## 2017-05-28 DIAGNOSIS — I2510 Atherosclerotic heart disease of native coronary artery without angina pectoris: Secondary | ICD-10-CM | POA: Diagnosis not present

## 2017-05-28 DIAGNOSIS — E7800 Pure hypercholesterolemia, unspecified: Secondary | ICD-10-CM | POA: Diagnosis not present

## 2017-05-28 MED ORDER — PITAVASTATIN CALCIUM 2 MG PO TABS: 1.0000 | ORAL_TABLET | Freq: Every day | ORAL | 11 refills | Status: DC

## 2017-05-28 NOTE — Progress Notes (Signed)
Cardiology Office Note Date:  05/28/2017   ID:  Amanda Curtis, DOB 03/03/1935, MRN [id number]  PCP:  Jilda Panda, MD  Cardiologist:  Sherren Mocha, MD    Chief Complaint  Patient presents with  . Coronary Artery Disease   History of Present Illness: Amanda Curtis is a 82 y.o. female who presents for follow-up evaluation.  The patient has been followed for coronary artery disease with history of multivessel CABG in 2015.  Other medical conditions include hypertension, hyperlipidemia, and bilateral carotid stenosis.  Her blood pressure and cholesterol have not been well controlled historically.  She has had a lot of medication intolerances.  In December 2018 she presented with an acute stroke.  She presented with right arm and leg weakness, numbness, and clumsiness.  Imaging studies were suggestive of left MCA territory infarcts, athero-versus cardioembolic source.  Her aspirin dose was increased to 325 mg daily.  She was followed by the stroke service throughout her hospital stay.  She has been found to have moderate left internal carotid artery stenosis and moderate to severe right internal carotid artery stenosis.  She saw vascular surgery who discussed right carotid endarterectomy but the patient declined after hearing the potential risks.  Her left carotid artery stenosis was not felt to be significant enough to treat.  An echocardiogram during her hospital stay demonstrated normal LV systolic function, no significant valvular disease, and normal atrial size.  The patient is here alone today.  She denies chest pain or shortness of breath.  She feels like she has had a pretty good recovery from her stroke with minimal residual problems.  She does admit to some palpitations on occasion.  She complains of fatigue, leg weakness, and muscle pains.  She has some dizziness and headache.  No orthopnea, PND, or leg swelling.   Past Medical History:  Diagnosis Date  . Arthritis   . Diabetes  mellitus without complication ([place])   . Graves disease 2000   s/p RIA  . Hypertension   . Insomnia   . Pituitary tumor   . Vitamin D deficiency     Past Surgical History:  Procedure Laterality Date  . APPENDECTOMY    . CORONARY ARTERY BYPASS GRAFT N/A 07/04/2013   Procedure: CORONARY ARTERY BYPASS GRAFTING (CABG) times four using left internal mammary artery and right saphenous leg vein.;  Surgeon: Gaye Pollack, MD;  Location: [facility];  Service: Open Heart Surgery;  Laterality: N/A;  . INTRAOPERATIVE TRANSESOPHAGEAL ECHOCARDIOGRAM N/A 07/04/2013   Procedure: INTRAOPERATIVE TRANSESOPHAGEAL ECHOCARDIOGRAM;  Surgeon: Gaye Pollack, MD;  Location: [facility];  Service: Open Heart Surgery;  Laterality: N/A;  . LEFT HEART CATHETERIZATION WITH CORONARY ANGIOGRAM N/A 06/30/2013   Procedure: LEFT HEART CATHETERIZATION WITH CORONARY ANGIOGRAM;  Surgeon: Blane Ohara, MD;  Location: [facility] CATH LAB;  Service: Cardiovascular;  Laterality: N/A;  . NASAL SEPTUM SURGERY     childhood    Current Outpatient Medications  Medication Sig Dispense Refill  . acetaminophen (TYLENOL) 325 MG tablet Take 2 tablets (650 mg total) by mouth every 6 (six) hours as needed for mild pain.    [name] aspirin 325 MG EC tablet Take 1 tablet (325 mg total) by mouth daily. 30 tablet 2  . chlorthalidone (HYGROTON) 25 MG tablet Take 1 tablet (25 mg total) by mouth daily. 90 tablet 1  . irbesartan (AVAPRO) 75 MG tablet Take 75 mg by mouth daily.    [name] levothyroxine (SYNTHROID, LEVOTHROID) 100 MCG tablet Take 1 tablet  by mouth daily.    . TRADJENTA 5 MG TABS tablet Take 1 tablet by mouth daily at 12 noon.    [name] zolpidem (AMBIEN) 5 MG tablet Take 5 mg by mouth as needed for sleep.    . Pitavastatin Calcium 2 MG TABS Take 1 tablet (2 mg total) by mouth daily. 30 tablet 11   No current facility-administered medications for this visit.     Allergies:   Metformin; Ampicillin; Atorvastatin; Codeine; Elavil [amitriptyline]; Hctz  [hydrochlorothiazide]; Hytrin [terazosin]; Melatonin; Trazodone; Wasp venom; and Erythromycin   Social History:  The patient  reports that she has never smoked. She has never used smokeless tobacco. She reports that she does not drink alcohol or use drugs.   Family History:  The patient's family history includes Cancer in her mother; Dementia in her mother; Diabetes in her father and paternal grandmother; Hypertension in her paternal grandmother.    ROS:  Please see the history of present illness.  Otherwise, review of systems is positive for constipation, visual disturbance, headache.  All other systems are reviewed and negative.    PHYSICAL EXAM: VS:  BP 128/80   Pulse 66   Ht 5\' 4"  (1.626 m)   Wt 163 lb 6.4 oz (74.1 kg)   SpO2 97%   BMI 28.05 kg/m  , BMI Body mass index is 28.05 kg/m. GEN: Well nourished, well developed, in no acute distress  HEENT: normal  Neck: no JVD, no masses. BL carotid bruits Cardiac: RRR without murmur or gallop      Respiratory:  clear to auscultation bilaterally, normal work of breathing GI: soft, nontender, nondistended, + BS MS: no deformity or atrophy  Ext: no pretibial edema Skin: warm and dry, no rash Neuro:  Strength and sensation are intact Psych: euthymic mood, full affect  EKG:  EKG is ordered today. The ekg ordered today shows normal sinus rhythm 66 bpm, left axis deviation, nonspecific ST and T wave abnormality.  Recent Labs: 06/28/2016: ALT 15 02/22/2017: BUN 24; Creatinine, Ser 1.10; Hemoglobin 12.2; Platelets 490; Potassium 3.6; Sodium 141 02/23/2017: TSH 0.431   Lipid Panel     Component Value Date/Time   CHOL 226 (H) 02/23/2017 0307   CHOL 289 (H) 06/28/2016 1514   TRIG 176 (H) 02/23/2017 0307   HDL 31 (L) 02/23/2017 0307   HDL 39 (L) 06/28/2016 1514   CHOLHDL 7.3 02/23/2017 0307   VLDL 35 02/23/2017 0307   LDLCALC 160 (H) 02/23/2017 0307   LDLCALC 191 (H) 06/28/2016 1514   LDLDIRECT 227.0 11/30/2014 1534      Wt  Readings from Last 3 Encounters:  05/28/17 163 lb 6.4 oz (74.1 kg)  03/30/17 158 lb (71.7 kg)  02/23/17 162 lb 14.7 oz (73.9 kg)     Cardiac Studies Reviewed: 2D Echo 02-23-2017: Study Conclusions  - Left ventricle: The cavity size was normal. There was mild   concentric hypertrophy. Systolic function was vigorous. The   estimated ejection fraction was in the range of 65% to 70%. Wall   motion was normal; there were no regional wall motion   abnormalities. Doppler parameters are consistent with abnormal   left ventricular relaxation (grade 1 diastolic dysfunction).   Doppler parameters are consistent with elevated ventricular   end-diastolic filling pressure. - Aortic valve: Trileaflet; mildly thickened, mildly calcified   leaflets. There was no regurgitation. - Mitral valve: Calcified annulus. Mildly thickened leaflets .   There was no regurgitation. - Right ventricle: The cavity size was normal. Wall thickness  was   normal. Systolic function was normal. - Right atrium: The atrium was normal in size. - Tricuspid valve: There was trivial regurgitation. - Pulmonary arteries: Systolic pressure was within the normal   range. - Inferior vena cava: The vessel was normal in size. - Pericardium, extracardiac: There was no pericardial effusion.  MRI Brain 02-22-2017: IMPRESSION: MRI HEAD:  1. Multiple subcentimeter infarcts LEFT MCA territory seen with embolic phenomena or, carotid artery stenosis. 2. Moderate to severe chronic small vessel ischemic disease and old lacunar infarcts. 3. Stable appearance of pituitary macroadenoma.  MRA HEAD:  1. No emergent large vessel occlusion. 2. Slow flow LEFT vertebral artery suggesting proximal stenosis or occlusion with reconstitution. 3. Mild atherosclerosis. 4. Recommend CTA neck for further evaluation.  ASSESSMENT AND PLAN: 1.  Coronary artery disease, native vessel, without angina: The patient remains on aspirin for  antiplatelet therapy.  She is on 325 mg of aspirin because of her stroke.  The patient is statin intolerant and currently on a low-dose of pravastatin with lipids well above goal.  I talked to her about consideration of a PCSK9 inhibitor.  She is very reluctant to try anything injectable.  We will try her on Livalo and follow-up lipids and LFTs in 3 months.  She will discontinue pravastatin.  2.  Hypertension: Blood pressure is much better controlled.  She will continue on chlorthalidone and irbesartan.  3.  Stroke: I will touch base with neurology to consider whether she might benefit from either an event monitor or implantable loop recorder.  She does have some palpitations and is at high risk for atrial fibrillation considering her advanced age and known atherosclerotic disease.  4.  Hyperlipidemia: See discussion above.  She is been seen in the lipid clinic before but has not followed through on recommendations consistently.  She does have a lot of medication intolerances.  We will try her on Livalo with follow-up labs. Last labs 01/2017 show an LDL cholesterol of 160 mg/dL.   Current medicines are reviewed with the patient today.  The patient does not have concerns regarding medicines.  Labs/ tests ordered today include:   Orders Placed This Encounter  Procedures  . Lipid panel  . Hepatic function panel  . EKG 12-Lead   Disposition:   FU 6 months  Signed, Sherren Mocha, MD  05/28/2017 1:18 PM    [facility] [address], [place], [place]  [address] Phone: [phone]; Fax: [phone]

## 2017-05-28 NOTE — Patient Instructions (Addendum)
Medication Instructions:  1) STOP PRAVASTATIN 2) START LIVALO 2 mg daily  3) START CoQ-10 200 mg daily  Labwork: Your provider recommends that you return for FASTING lab work in [place].  Testing/Procedures: None  Follow-Up: Your provider wants you to follow-up in: 6 months with Dr. Burt Knack. You will receive a reminder letter in the mail two months in advance. If you don't receive a letter, please call our office to schedule the follow-up appointment.   Any Other Special Instructions Will Be Listed Below (If Applicable).     If you need a refill on your cardiac medications before your next appointment, please call your pharmacy.

## 2017-05-31 ENCOUNTER — Telehealth: Payer: Self-pay

## 2017-05-31 DIAGNOSIS — I4891 Unspecified atrial fibrillation: Secondary | ICD-10-CM

## 2017-05-31 NOTE — Telephone Encounter (Signed)
-----   Message from Sherren Mocha, MD sent at 05/30/2017  6:00 PM EDT ----- I reviewed her case with Dr Leonie Man and he recommends a 30 day monitor to evaluate for AFib as a possible cause of her stroke ----- Message ----- From: Garvin Fila, MD Sent: 05/30/2017   6:53 AM To: Sherren Mocha, MD  She is a tough one. ESUS criteria for loop state less than 50% stenosis of symptomatic artery and she has LICA 10% stenosis. I would shoot for 30day heart monitor first  ----- Message ----- From: Sherren Mocha, MD Sent: 05/28/2017  12:32 PM To: Garvin Fila, MD  Pramod: you are seeing this patient in follow-up in a few weeks. Let me know if you think she should have an event monitor or loop implant. I'm happy to help facilitate.   thx Ronalee Belts

## 2017-05-31 NOTE — Telephone Encounter (Signed)
Left message to call back  

## 2017-06-01 NOTE — Telephone Encounter (Signed)
F/U Call:  Matthew Saras (Patient grandson) returning call from yesterday.

## 2017-06-01 NOTE — Telephone Encounter (Signed)
Patient agrees to event monitor. Monitor ordered for scheduling.

## 2017-06-04 NOTE — Telephone Encounter (Signed)
Patient has been scheduled 4/12 for monitor.

## 2017-06-07 ENCOUNTER — Other Ambulatory Visit: Payer: Self-pay | Admitting: Cardiovascular Disease

## 2017-06-07 DIAGNOSIS — E785 Hyperlipidemia, unspecified: Secondary | ICD-10-CM

## 2017-06-07 DIAGNOSIS — I10 Essential (primary) hypertension: Secondary | ICD-10-CM

## 2017-06-08 ENCOUNTER — Ambulatory Visit (INDEPENDENT_AMBULATORY_CARE_PROVIDER_SITE_OTHER): Payer: Medicare Other

## 2017-06-08 DIAGNOSIS — I4891 Unspecified atrial fibrillation: Secondary | ICD-10-CM | POA: Diagnosis not present

## 2017-06-12 ENCOUNTER — Telehealth: Payer: Self-pay | Admitting: Radiology

## 2017-06-12 NOTE — Telephone Encounter (Signed)
Fyi, Per [employer] (monitor complany) the patient called to say the monitor is causing her to itch. The company offered her some alternatives for patients with nickel allergies and sensitive skin but she just wants to send the device back to [employer]. Monitor was applied April 12th.

## 2017-06-21 ENCOUNTER — Ambulatory Visit: Payer: Self-pay | Admitting: Neurology

## 2017-08-14 ENCOUNTER — Ambulatory Visit: Payer: Self-pay | Admitting: Neurology

## 2017-08-20 ENCOUNTER — Telehealth: Payer: Self-pay | Admitting: Neurology

## 2017-08-20 NOTE — Telephone Encounter (Signed)
Dr.Sethi this pt is a hospital follow up from 01/2017. PT cancel in April 2019 and r/s in June 2019. Please advise thanks. Pt has not been seen in office before. Thanks

## 2017-08-20 NOTE — Telephone Encounter (Signed)
I called and spoke to the patient. She is complaining of dizziness and not feeling well which may perhaps related to new blood pressure medication she is has been taking.I assured her that I do not feel her symptoms are related to a pituitary tumor which was found to be stable on recent MRI compared to the previous one. I advised her to see her primary care physician for her complaints. I apologized for having to reschedule her appointment to see me on June 2 to a  later date. She voices understanding.

## 2017-08-20 NOTE — Telephone Encounter (Signed)
Patient states she has no energy, she can barely pull herself out of bed and her eyesight is worsening. She states she hasn't heard anything about her "pituitary tumor" and would like to know more about it.

## 2017-08-27 ENCOUNTER — Other Ambulatory Visit: Payer: Medicare Other

## 2017-08-28 ENCOUNTER — Ambulatory Visit: Payer: Self-pay | Admitting: Neurology

## 2017-09-13 ENCOUNTER — Encounter: Payer: Self-pay | Admitting: Neurology

## 2017-09-13 ENCOUNTER — Telehealth: Payer: Self-pay | Admitting: Neurology

## 2017-09-13 ENCOUNTER — Ambulatory Visit: Payer: Medicare Other | Admitting: Neurology

## 2017-09-13 VITALS — BP 165/90 | HR 72 | Ht 65.0 in | Wt 160.4 lb

## 2017-09-13 DIAGNOSIS — I6521 Occlusion and stenosis of right carotid artery: Secondary | ICD-10-CM | POA: Diagnosis not present

## 2017-09-13 NOTE — Telephone Encounter (Signed)
RN call Amanda Curtis about the visit today. He was unable attend.RN stated pt has avs of the visit summary.Amanda Curtis stated his granddmother read off the avs summary but it was not clear. Rn stated pt is having a carotid ultrasound,and [facility] will be giving her a call to schedule that. Rn stated if pt does not get a call from the vas lab for the carotid ultrasound within 3 to 5 days to call our office back. Rn stated the pituitary tumor was stable from Dr. Leonie Man telephone call on 08/20/2017. RN stated pt Is to continue aspirin 325 daily for stroke prevention. [name] verbalized understanding.

## 2017-09-13 NOTE — Patient Instructions (Signed)
I had a long d/w patient about her recent stroke,right carotid stenosis risk for recurrent stroke/TIAs, personally independently reviewed imaging studies and stroke evaluation results and answered questions.Continue aspirin 325 mg daily  for secondary stroke prevention and maintain strict control of hypertension with blood pressure goal below 130/90, diabetes with hemoglobin A1c goal below 6.5% and lipids with LDL cholesterol goal below 70 mg/dL. I also advised the patient to eat a healthy diet with plenty of whole grains, cereals, fruits and vegetables, exercise regularly and maintain ideal body weight .check follow-up screening carotid ultrasound to follow her known severe right carotid stenosis. Followup in the future with my nurse practitioner Janett Billow in 6 months or call earlier if necessary  Carotid Artery Disease The carotid arteries are arteries on both sides of the neck. They carry blood to the brain. Carotid artery disease is when the arteries get smaller (narrow) or get blocked. If these arteries get smaller or get blocked, you are more likely to have a stroke or warning stroke (transient ischemic attack). Follow these instructions at home:  Take medicines as told by your doctor. Make sure you understand all your medicine instructions. Do not stop your medicines without talking to your doctor first.  Follow your doctor's diet instructions. It is important to eat a healthy diet that includes plenty of: ? Fresh fruits. ? Vegetables. ? Lean meats.  Avoid: ? High-fat foods. ? High-sodium foods. ? Foods that are fried, overly processed, or have poor nutritional value.  Stay a healthy weight.  Stay active. Get at least 30 minutes of activity every day.  Do not smoke.  Limit alcohol use to: ? No more than 2 drinks a day for men. ? No more than 1 drink a day for women who are not pregnant.  Do not use illegal drugs.  Keep all doctor visits as told. Get help right away if:  You have  sudden weakness or loss of feeling (numbness) on one side of the body, such as the face, arm, or leg.  You have sudden confusion.  You have trouble speaking (aphasia) or understanding.  You have sudden trouble seeing out of one or both eyes.  You have sudden trouble walking.  You have dizziness or feel like you might pass out (faint).  You have a loss of balance or your movements are not steady (uncoordinated).  You have a sudden, severe headache with no known cause.  You have trouble swallowing (dysphagia). Call your local emergency services (911 in [place] Do notdrive yourself to the clinic or hospital. This information is not intended to replace advice given to you by your health care provider. Make sure you discuss any questions you have with your health care provider. Document Released: 01/31/2012 Document Revised: 07/22/2015 Document Reviewed: 08/14/2012 Elsevier Interactive Patient Education  [employer].

## 2017-09-13 NOTE — Telephone Encounter (Signed)
Pt's grandson Holland/DPR was not able to attend the appt today. He is wanting to discuss the visit. Please call

## 2017-09-14 NOTE — Progress Notes (Signed)
[facility] [address] [place]. [place] [address] [phone]       OFFICE FOLLOW-UP NOTE  Amanda. Amanda Curtis Date of Birth:  1935-07-03 Medical Record Number:  [id number]   HPI: Amanda Curtis is a 69 year lady seen today for initial office follow for the following hospital admission for stroke in December 2018. History is obtained from the patient and review of electronic medical records. I personally reviewed imaging films. Patient was scheduled to see me earlier but unfortunately had to be rescheduled to changing schedules.Amanda Curtis is a 82 y.o. female who was last her normal self on the evening of 02/21/17 with sudden start of symptoms around 9 PM that day.  She states that it is been a static deficit since that time.  It is both numbness and weakness, though the weakness is more prominent.  She was transferred to [facility] to obtain an MRI, and once obtained it demonstrated scattered strokes in the left MCA distribution. She was not a candidate for TPA due to unknown duration and presenting late. MRI scan of the brain showed multiple subcentimeter left MCA embolic infarcts. The moderate changes of chronic small vessel disease and old lacunar infarcts as well. There is stable appearance of 1.1 cm pituitary macroadenoma which was unchanged compared with previous MRI in September. MRA of the brain showed no event emergent large vessel occlusion. The left vertebral artery appeared to have chronic occlusion versus hypoplasia. Transthoracic echo showed normal ejection fraction. Carotid ultrasound shows 6-79% right ICA and 40-59% left ICA stenosis. Her extremity venous Dopplers were negative for DVT. Patient states she did well she had mild right-sided weakness is resolved in a few days. She's had no further recurrent stroke or TIA symptoms. She is tolerating aspirin well without bleeding or bruising. Her blood pressure is usually well controlled and today it is elevated in office at  162/90. She is seeing endocrinologist Dr. Posey Pronto was recently changed his her blood pressure and cholesterol as well as diabetes medications. He has not had any follow-up carotid ultrasound to follow her high grade right carotid stenosis yet. She has no new complaints today.         ROS:   14 system review of systems is positive for blurred vision, runny nose, insomnia, muscle cramps, headache and all other systems negative  PMH:  Past Medical History:  Diagnosis Date  . Arthritis   . Diabetes mellitus without complication ([place])   . Graves disease 2000   s/p RIA  . Hypertension   . Insomnia   . Pituitary tumor   . Stroke ([place])   . Vitamin D deficiency     Social History:  Social History   Socioeconomic History  . Marital status: Widowed    Spouse name: Not on file  . Number of children: Not on file  . Years of education: Not on file  . Highest education level: Not on file  Occupational History  . Not on file  Social Needs  . Financial resource strain: Not on file  . Food insecurity:    Worry: Not on file    Inability: Not on file  . Transportation needs:    Medical: Not on file    Non-medical: Not on file  Tobacco Use  . Smoking status: Never Smoker  . Smokeless tobacco: Never Used  Substance and Sexual Activity  . Alcohol use: No  . Drug use: No  . Sexual activity: Not on file  Lifestyle  .  Physical activity:    Days per week: Not on file    Minutes per session: Not on file  . Stress: Not on file  Relationships  . Social connections:    Talks on phone: Not on file    Gets together: Not on file    Attends religious service: Not on file    Active member of club or organization: Not on file    Attends meetings of clubs or organizations: Not on file    Relationship status: Not on file  . Intimate partner violence:    Fear of current or ex partner: Not on file    Emotionally abused: Not on file    Physically abused: Not on file    Forced sexual  activity: Not on file  Other Topics Concern  . Not on file  Social History Narrative   Widow   Retired worked for [employer] and did a lot of travelling for [employer].   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   She has 1 son- lives in [place]   3 grandsons- college age (one is in [place])             Medications:   Current Outpatient Medications on File Prior to Visit  Medication Sig Dispense Refill  . acetaminophen (TYLENOL) 325 MG tablet Take 2 tablets (650 mg total) by mouth every 6 (six) hours as needed for mild pain.    [name] amLODipine (NORVASC) 5 MG tablet Take by mouth.    [name] aspirin 325 MG EC tablet Take 1 tablet (325 mg total) by mouth daily. 30 tablet 2  . chlorthalidone (HYGROTON) 25 MG tablet TAKE 1 TABLET BY MOUTH DAILY 90 tablet 3  . hydrALAZINE (APRESOLINE) 10 MG tablet Take by mouth.    . irbesartan (AVAPRO) 75 MG tablet Take 75 mg by mouth daily.    [name] levothyroxine (SYNTHROID, LEVOTHROID) 100 MCG tablet Take 1 tablet by mouth daily.    . Pitavastatin Calcium 2 MG TABS Take 1 tablet (2 mg total) by mouth daily. 30 tablet 11  . pravastatin (PRAVACHOL) 80 MG tablet Take by mouth.    . SYNTHROID 112 MCG tablet   2  . TRADJENTA 5 MG TABS tablet Take 1 tablet by mouth daily at 12 noon.    [name] zolpidem (AMBIEN) 5 MG tablet Take 5 mg by mouth as needed for sleep.     No current facility-administered medications on file prior to visit.     Allergies:   Allergies  Allergen Reactions  . Metformin Other (See Comments)  . Ace Inhibitors Cough  . Amlodipine Other (See Comments)    Severe leg cramps Severe leg cramps  . Ampicillin Other (See Comments)    rash  . Atorvastatin Other (See Comments)    Severe muscle cramping Severe muscle cramping  . Codeine Other (See Comments)    dizziness dizziness  . Elavil [Amitriptyline] Other (See Comments)    Tongue swelling Tongue swelling  . Hctz  [Hydrochlorothiazide] Other (See Comments)    Severe leg cramps Severe leg cramps Severe leg cramps  . Hytrin [Terazosin] Other (See Comments)    syncope  . Melatonin Other (See Comments)    Headaches Headaches Headaches  . Trazodone Other (See Comments)  . Wasp Venom Other (See Comments)    Hand swelling Hand swelling  . Erythromycin Rash    Physical Exam General: well developed, well  nourished elderly lady, seated, in no evident distress Head: head normocephalic and atraumatic.  Neck: supple with soft right carotid   bruit Cardiovascular: regular rate and rhythm, no murmurs Musculoskeletal: no deformity Skin:  no rash/petichiae Vascular:  Normal pulses all extremities Vitals:   09/13/17 1342  BP: (!) 165/90  Pulse: 72   Neurologic Exam Mental Status: Awake and fully alert. Oriented to place and time. Recent and remote memory intact. Attention span, concentration and fund of knowledge appropriate. Mood and affect appropriate.  Cranial Nerves: Fundoscopic exam reveals sharp disc margins. Pupils equal, briskly reactive to light. Extraocular movements full without nystagmus. Visual fields full to confrontation. Hearing intact. Facial sensation intact. Face, tongue, palate moves normally and symmetrically.  Motor: Normal bulk and tone. Normal strength in all tested extremity muscles. Sensory.: intact to touch ,pinprick .position and vibratory sensation.  Coordination: Rapid alternating movements normal in all extremities. Finger-to-nose and heel-to-shin performed accurately bilaterally. Gait and Station: Arises from chair without difficulty. Stance is normal. Gait demonstrates normal stride length and balance . Able to heel, toe and tandem walk without difficulty.  Reflexes: 1+ and symmetric. Toes downgoing.   NIHSS  0 Modified Rankin 1   ASSESSMENT: 82 year old lady with left MCA branch infarcts in December 2018 secondary to moderate left carotid stenosis. She also has  asymptomatic high-grade severe right ICA stenosis. Multiple vascular risk factors of hypertension, hyperlipidemia, coronary artery disease and extracranial carotid disease.     PLAN: I had a long d/w patient about her recent stroke,right carotid stenosis risk for recurrent stroke/TIAs, personally independently reviewed imaging studies and stroke evaluation results and answered questions.Continue aspirin 325 mg daily  for secondary stroke prevention and maintain strict control of hypertension with blood pressure goal below 130/90, diabetes with hemoglobin A1c goal below 6.5% and lipids with LDL cholesterol goal below 70 mg/dL. I also advised the patient to eat a healthy diet with plenty of whole grains, cereals, fruits and vegetables, exercise regularly and maintain ideal body weight .check follow-up screening carotid ultrasound to follow her known severe right carotid stenosis. Followup in the future with my nurse practitioner Janett Billow in 6 months or call earlier if necessary.Greater than 50% of time during this 25 minute visit was spent on counseling,explanation of diagnosis, planning of further management, discussion with patient and family and coordination of care Antony Contras, MD  [facility] [address] [place], [place] [address]  Phone [phone] Fax [phone] Note: This document was prepared with digital dictation and possible smart phrase technology. Any transcriptional errors that result from this process are unintentional

## 2017-09-25 ENCOUNTER — Ambulatory Visit (HOSPITAL_COMMUNITY)
Admission: RE | Admit: 2017-09-25 | Discharge: 2017-09-25 | Disposition: A | Discharge status: Home / Self Care | Payer: Medicare Other | Source: Ambulatory Visit | Attending: Neurology | Admitting: Neurology

## 2017-09-25 DIAGNOSIS — I6521 Occlusion and stenosis of right carotid artery: Secondary | ICD-10-CM | POA: Diagnosis present

## 2017-09-25 NOTE — Progress Notes (Signed)
Preliminary notes--Bilateral carotid duplex exam completed. Right ICA 60-79% stenosis. Left ICA 1-39% stenosis according to the category of velocity.  Bilateral vertebral arteries antegrade flow.  Amanda Curtis (RDMS RVT) 09/25/17 1:47 PM

## 2017-09-26 ENCOUNTER — Ambulatory Visit (HOSPITAL_COMMUNITY): Payer: Medicare Other

## 2017-10-11 ENCOUNTER — Telehealth: Payer: Self-pay

## 2017-10-11 NOTE — Telephone Encounter (Signed)
-----   Message from Garvin Fila, MD sent at 10/11/2017  9:05 AM EDT ----- Amanda Curtis inform the patient that follow-up carotid ultrasound study shows stable appearance of the moderate right carotid and mild left carotid stenosis. No new or worrisome finding.

## 2017-10-11 NOTE — Telephone Encounter (Signed)
Notes recorded by Marval Regal, RN on 10/11/2017 at 5:00 PM EDT Left vm for patients grandson [name] call back to give him results of the vas carotid. ------

## 2017-10-12 ENCOUNTER — Telehealth: Payer: Self-pay

## 2017-10-12 NOTE — Telephone Encounter (Signed)
Made in error

## 2017-10-12 NOTE — Telephone Encounter (Signed)
-----   Message from Garvin Fila, MD sent at 10/11/2017  9:05 AM EDT ----- Mitchell Heir inform the patient that follow-up carotid ultrasound study shows stable appearance of the moderate right carotid and mild left carotid stenosis. No new or worrisome finding.

## 2017-10-12 NOTE — Telephone Encounter (Signed)
Pt Grandson(Holland) has returned call to [employer], he is asking for a call back with results

## 2017-10-12 NOTE — Telephone Encounter (Signed)
Notes recorded by Marval Regal, RN on 10/12/2017 at 11:33 AM EDT Rn return [name] pts grandson on dpr. The carotid ultrasound shows stable appearance of moderate right carotid,and mild carotid stenosis.NO new or worrisome findings. Pts grandson will tell the patient,and verbalized understanding. ------

## 2017-11-02 ENCOUNTER — Other Ambulatory Visit (HOSPITAL_COMMUNITY): Payer: Self-pay | Admitting: Internal Medicine

## 2017-11-02 DIAGNOSIS — D352 Benign neoplasm of pituitary gland: Secondary | ICD-10-CM

## 2017-11-08 ENCOUNTER — Ambulatory Visit (HOSPITAL_COMMUNITY)
Admission: RE | Admit: 2017-11-08 | Discharge: 2017-11-08 | Disposition: A | Discharge status: Home / Self Care | Payer: Medicare Other | Source: Ambulatory Visit | Attending: Internal Medicine | Admitting: Internal Medicine

## 2017-11-08 ENCOUNTER — Encounter (HOSPITAL_COMMUNITY): Payer: Self-pay

## 2017-11-09 ENCOUNTER — Other Ambulatory Visit (HOSPITAL_COMMUNITY): Payer: Self-pay | Admitting: Internal Medicine

## 2017-11-09 DIAGNOSIS — D352 Benign neoplasm of pituitary gland: Secondary | ICD-10-CM

## 2017-11-27 ENCOUNTER — Ambulatory Visit (HOSPITAL_COMMUNITY)
Admission: RE | Admit: 2017-11-27 | Discharge: 2017-11-27 | Disposition: A | Discharge status: Home / Self Care | Payer: Medicare Other | Source: Ambulatory Visit | Attending: Internal Medicine | Admitting: Internal Medicine

## 2017-11-27 DIAGNOSIS — I6782 Cerebral ischemia: Secondary | ICD-10-CM | POA: Diagnosis not present

## 2017-11-27 DIAGNOSIS — D352 Benign neoplasm of pituitary gland: Secondary | ICD-10-CM | POA: Insufficient documentation

## 2017-11-27 MED ORDER — GADOBUTROL 1 MMOL/ML IV SOLN
7.0000 mL | Freq: Once | INTRAVENOUS | Status: AC | PRN
  Administered 2017-11-27: 7 mL via INTRAVENOUS

## 2018-01-02 ENCOUNTER — Other Ambulatory Visit: Payer: Self-pay | Admitting: Cardiovascular Disease

## 2018-03-18 ENCOUNTER — Ambulatory Visit: Payer: Medicare Other | Admitting: Adult Health

## 2018-09-09 IMAGING — MR MR MRA HEAD W/O CM
9 of 11 series · 31 of 48 positions shown · non-contrast
Comparison: MRI of the head November 01, 2016

CLINICAL DATA: Leg weakness, fell yesterday. History of pituitary
tumor, hypertension and diabetes.

EXAM:
MRI HEAD WITHOUT CONTRAST
MRA HEAD WITHOUT CONTRAST
TECHNIQUE: Multiplanar, multiecho pulse sequences of the brain and surrounding
structures were obtained without intravenous contrast. Angiographic
images of the head were obtained using MRA technique without
contrast.

[Series 3: DWI · axial · 3.0mm · 0.94mm/px · z∈[-43,+94]mm · 7 of 94 slices shown (1 of 2)]
[im 1/94]
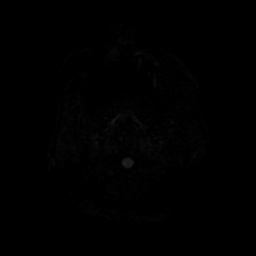
[im 16/94]
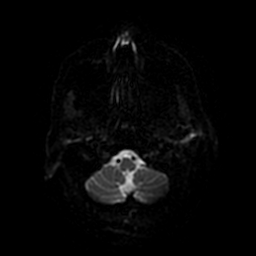
[im 32/94]
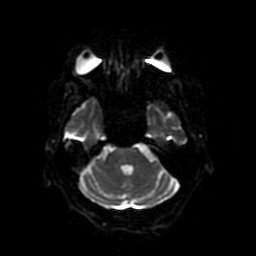
[im 47/94]
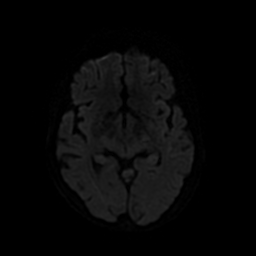
[im 63/94]
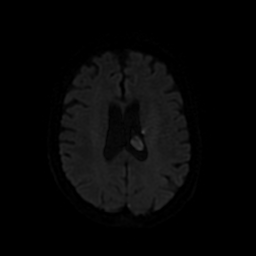
[im 78/94]
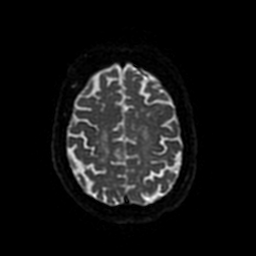
[im 94/94]
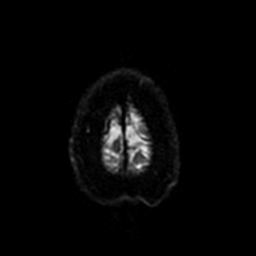

[Series 4: DWI · coronal · 4.0mm · 0.94mm/px · 5 of 68 slices shown (2 of 2)]
[im 1/68]
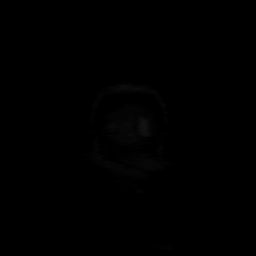
[im 17/68]
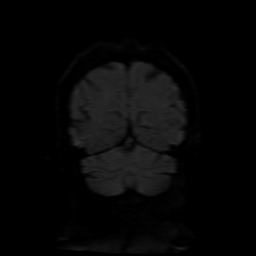
[im 34/68]
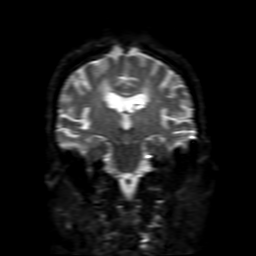
[im 51/68]
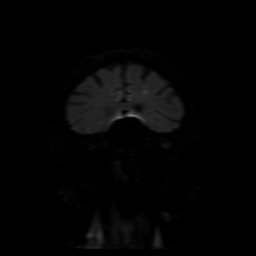
[im 68/68]
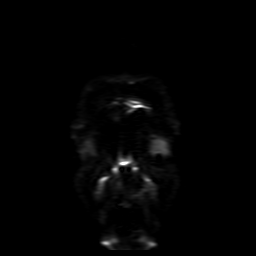

[Series 5: FLAIR · axial · 3.0mm · 0.94mm/px · z∈[-43,+94]mm · 2 of 24 slices shown (1 of 2)]
[im 1/24]
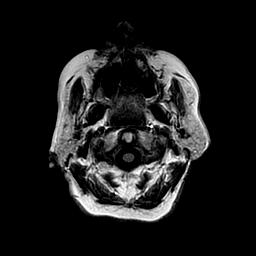
[im 24/24]
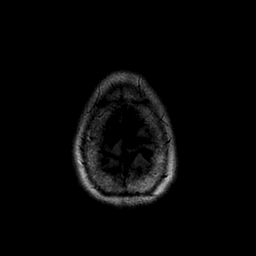

[Series 6: (person_name) · axial · 3.0mm · 0.47mm/px · z∈[-44,+73]mm · 6 of 96 slices shown]
[im 1/96]
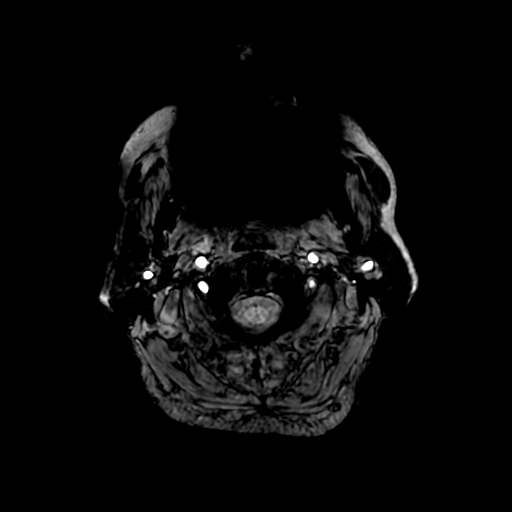
[im 16/96]
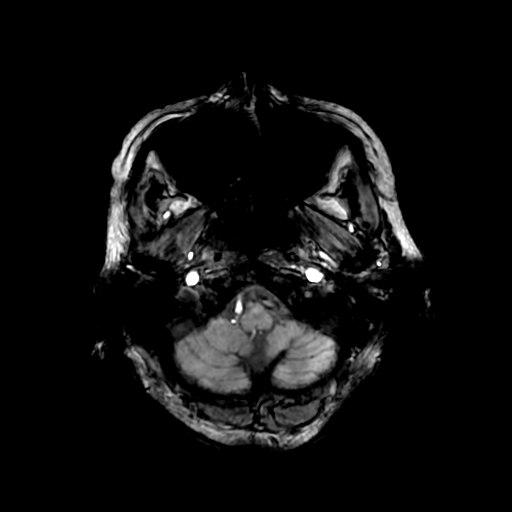
[im 32/96]
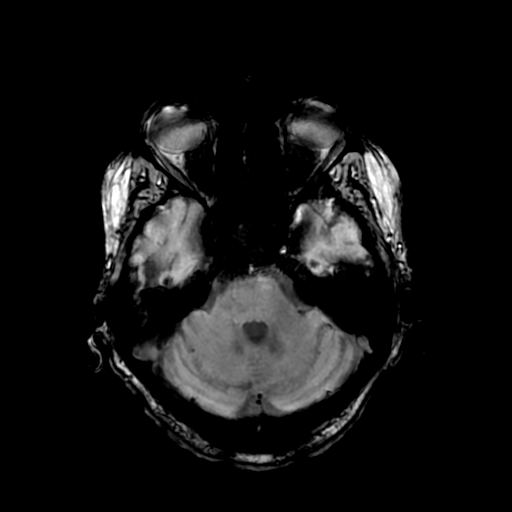
[im 48/96]
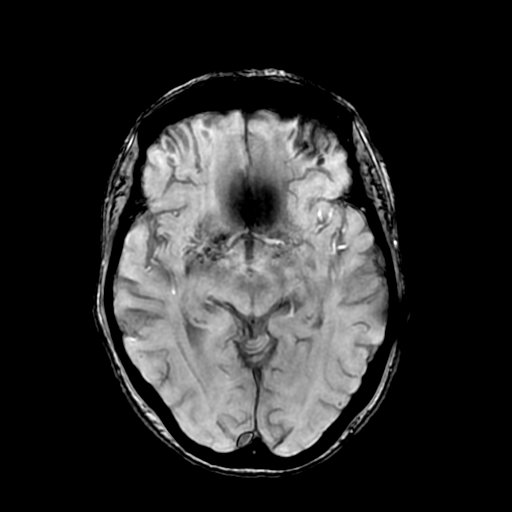
[im 64/96]
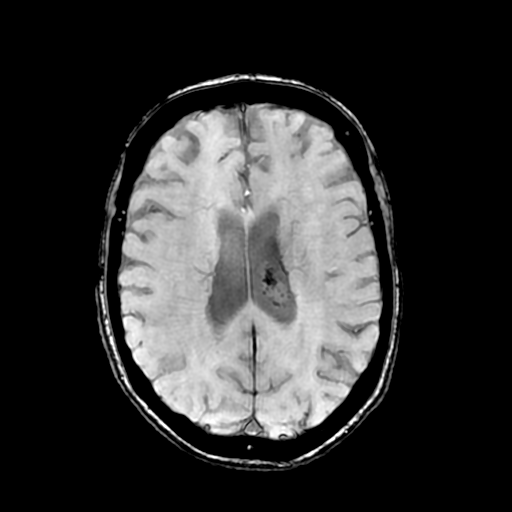
[im 80/96]
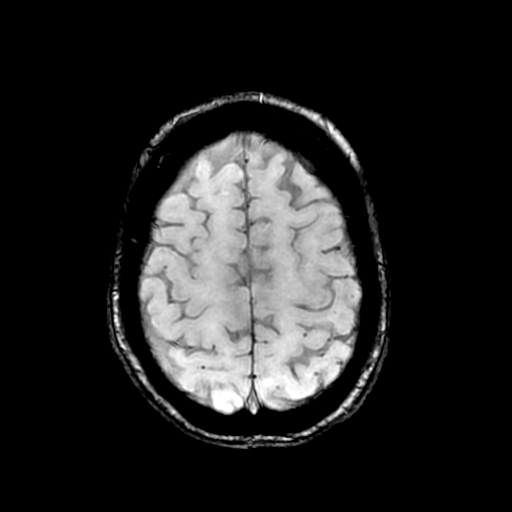

[Series 7: T2 · axial · 5.0mm · 0.47mm/px · z∈[-43,+94]mm · 2 of 24 slices shown (1 of 2)]
[im 1/24]
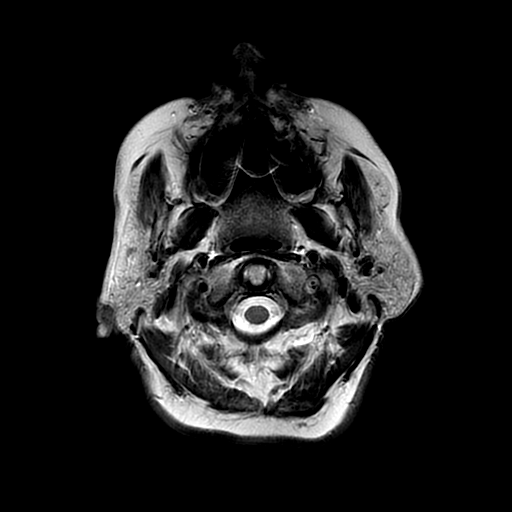
[im 24/24]
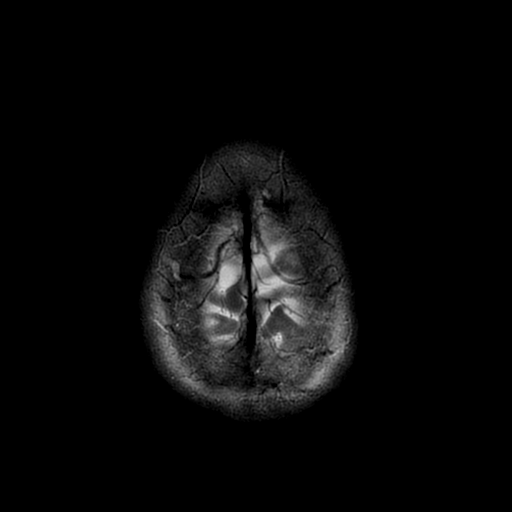

[Series 9: FLAIR · sagittal · 5.0mm · 0.47mm/px · 2 of 23 slices shown (2 of 2)]
[im 1/23]
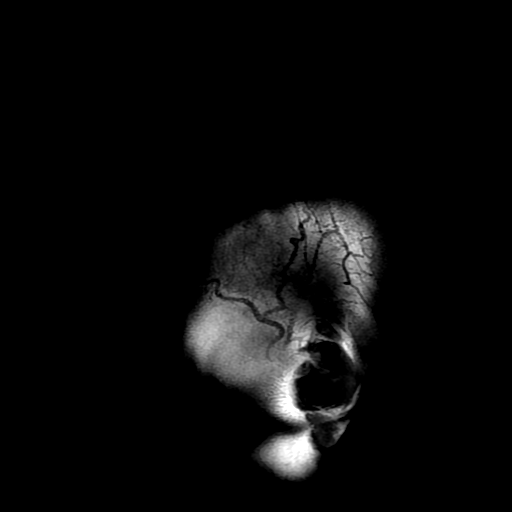
[im 23/23]
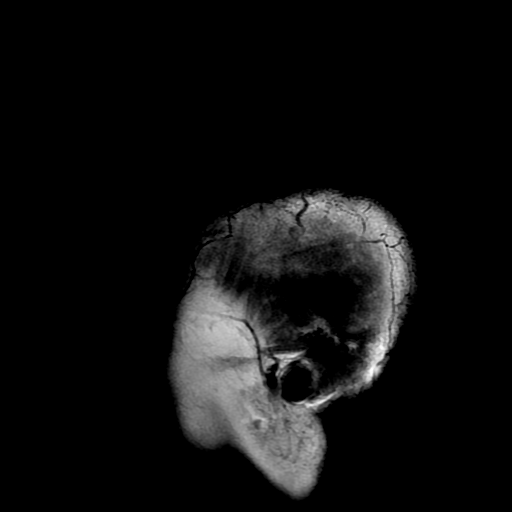

[Series 11: T2 · coronal · 5.0mm · 0.43mm/px · 2 of 28 slices shown (2 of 2)]
[im 1/28]
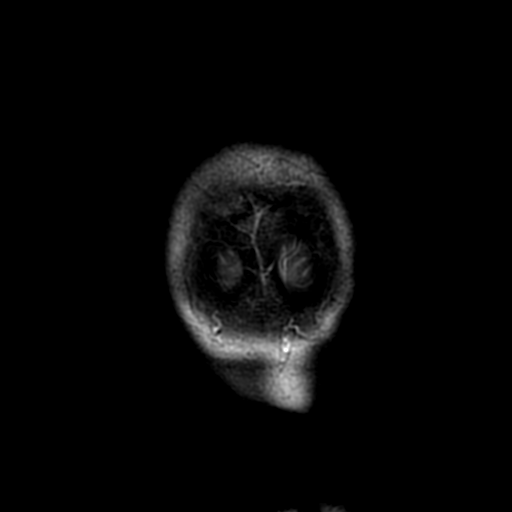
[im 28/28]
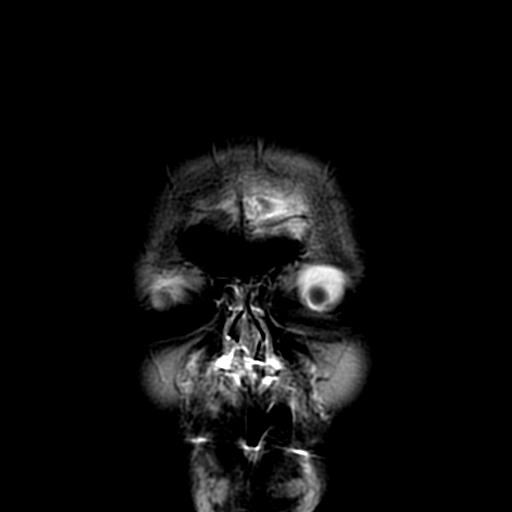

[Series 350: ADC · axial · 3.0mm · 0.94mm/px · z∈[-43,+94]mm · 3 of 45 slices shown (1 of 2)]
[im 1/45]
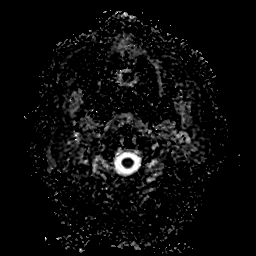
[im 23/45]
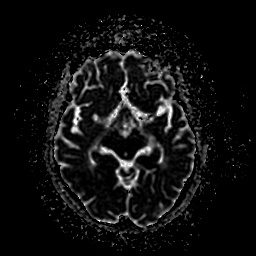
[im 45/45]
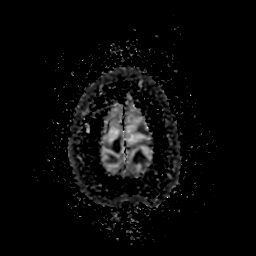

[Series 450: ADC · coronal · 4.0mm · 0.94mm/px · 2 of 34 slices shown (2 of 2)]
[im 1/34]
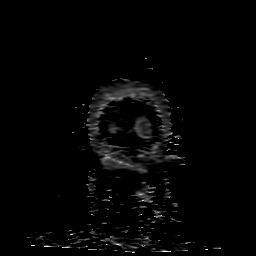
[im 34/34]
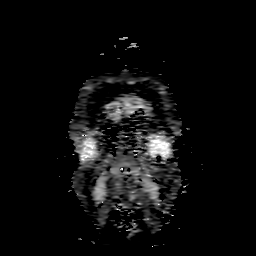

[31 of 48 positions shown; findings below may reference images not displayed]

FINDINGS: MRI HEAD FINDINGS

BRAIN: Subcentimeter foci of reduced diffusion LEFT frontal and a
lesser extent LEFT parietal lobes involving the cortex, white matter
as well and LEFT caudate body. Identifiable lesions demonstrate low
ADC values. Punctate reduced diffusion versus artifact LEFT mesial
parietal lobe. Scattered chronic micro hemorrhages predominately
peripheral distribution, most commonly seen with chronic
hypertension. Moderate parenchymal brain volume loss. No
hydrocephalus. Patchy to confluent supratentorial white matter FLAIR
T2 hyperintensities. Old small LEFT cerebellar infarcts. Old
bilateral basal ganglia lacunar infarcts. No midline shift, mass
effect or intraparenchymal masses.

VASCULAR: Worsening intermediate signal LEFT vertebral artery as
characterized below .

SKULL AND UPPER CERVICAL SPINE: Re- demonstration of 11 x 14 x 15 mm
sella/suprasellar mass most consistent with macroadenoma. Mildly
heterogeneous lead bright T1 bone marrow signal seen with
osteopenia. Craniocervical junction maintained.

SINUSES/ORBITS: The mastoid air-cells and included paranasal sinuses
are well-aerated. The included ocular globes and orbital contents
are non-suspicious.

OTHER: None.

MRA HEAD FINDINGS

ANTERIOR CIRCULATION: Normal flow related enhancement of the
included cervical, petrous, cavernous and supraclinoid internal
carotid arteries. Mild luminal irregularity RIGHT internal carotid
artery compatible with atherosclerosis. Patent anterior
communicating artery. Patent anterior and middle cerebral arteries.
Mild luminal irregularity RIGHT M1 segment compatible with
atherosclerosis.

No large vessel occlusion, flow limiting stenosis, aneurysm.

POSTERIOR CIRCULATION: Poor flow related enhancement LEFT vertebral
artery, potentially occluded in the neck with reconstitution
distally. Patent RIGHT vertebral artery. Basilar artery is patent,
with normal flow related enhancement of the main branch vessels.
Patent posterior cerebral arteries.

No large vessel occlusion, flow limiting stenosis,  aneurysm.

ANATOMIC VARIANTS: None.

Source images and MIP images were reviewed.
IMPRESSION: MRI HEAD:

1. Multiple subcentimeter infarcts LEFT MCA territory seen with
embolic phenomena or, carotid artery stenosis.
2. Moderate to severe chronic small vessel ischemic disease and old
lacunar infarcts.
3. Stable appearance of pituitary macroadenoma.

MRA HEAD:

1. No emergent large vessel occlusion.
2. Slow flow LEFT vertebral artery suggesting proximal stenosis or
occlusion with reconstitution.
3. Mild atherosclerosis.
4. Recommend CTA neck for further evaluation.
Critical Value/emergent results were called by telephone at the time
of interpretation on 02/22/2017 at [DATE] to Dr. AGUSTIN OLAVARRIA ,
who verbally acknowledged these results.

## 2019-04-15 DIAGNOSIS — E1151 Type 2 diabetes mellitus with diabetic peripheral angiopathy without gangrene: Secondary | ICD-10-CM | POA: Diagnosis not present

## 2019-04-15 DIAGNOSIS — E119 Type 2 diabetes mellitus without complications: Secondary | ICD-10-CM | POA: Diagnosis not present

## 2019-04-15 DIAGNOSIS — D352 Benign neoplasm of pituitary gland: Secondary | ICD-10-CM | POA: Diagnosis not present

## 2019-04-15 DIAGNOSIS — E039 Hypothyroidism, unspecified: Secondary | ICD-10-CM | POA: Diagnosis not present

## 2019-04-29 ENCOUNTER — Ambulatory Visit: Payer: Medicare Other | Admitting: Physician Assistant

## 2019-06-12 NOTE — Progress Notes (Deleted)
{Choose 1 Note Type (Video or Telephone):[phone]}   The patient was identified using 2 identifiers.  Date:  06/12/2019   ID:  Amanda Curtis, DOB [birth year], MRN [id number]  {Patient Location:[phone]::"Home"} {Provider Location:[phone]::"Home"}  PCP:  Jilda Panda, MD  Cardiologist:  No primary care provider on file. *** Electrophysiologist:  None   Evaluation Performed:  {Choose Visit VGKK:[id number]::"HIDUPB-DH Visit"}  Chief Complaint:  ***  Patient Profile: Amanda Curtis is a 84 y.o. female with:  Coronary artery disease   S/p CABG in 06/2013  Hx of CVA (MRI suggestive of L MCA territory infarcts - athero vs cardioembolic source)  Event monitor 4/19: worn for just a few days; no AFib on limited monitor   Carotid artery dz  [place] 12/18: R 60-79; L 40-59  Seen by vasc surgery; pt declined R CEA  [place] 7/19: R 60-79, L 1-39  Diabetes mellitus   Chronic kidney disease stage 3 (followed by Dr. Biagio Quint at [facility])  Hypertension   Hyperlipidemia   Intol of multiple statins  FHx of premature CAD   Pituitary adenoma (follow[facility]. Amalia Greenhouse at [facility])  Prior CV Studies: *** Carotid US 09/25/17 R 60-79; L 1-39  Event Monitor 06/08/17 Normal sinus rhythm No AFib, ventricular arrhythmia, pauses  Echocardiogram 02/23/17 Mild conc LVH, vigorous LVF, EF 65-70, no RWMA, Gr 1 DD, MAC, normal RVSF, trivial TR  Carotid US 02/23/17 R 60-79; L 40-59  Myoview 04/08/14 Mild ischemia AL and IL walls, EF 71  LHC (06/30/13):   Dist LM 30-40%, prox LAD 80%, mid LAD 95-99%, Dx 90% then 60-70% at D3, CFX 90%, mid RCA 95%, dist RCA 60-70% >>> CABG  Nuclear (02/2013): Inferolateral and anterolateral ischemia, EF 73%   History of Present Illness:   Ms. [name] was last seen in clinic by Dr. Burt Knack in 05/2017.  She was referred to neurology (Dr. Leonie Man) and set up for an event monitor due to her prior hx of stroke with some concern for cardioembolic source.  She was not able  to wear the monitor for more than a few days due to allergy to the adhesive.  She had no evidence of atrial fibrillation on the available monitor strips.   ***   Past Medical History:  Diagnosis Date  . Arthritis   . Diabetes mellitus without complication ([place])   . Graves disease 2000   s/p RIA  . Hypertension   . Insomnia   . Pituitary tumor   . Stroke ([place])   . Vitamin D deficiency    Past Surgical History:  Procedure Laterality Date  . APPENDECTOMY    . CORONARY ARTERY BYPASS GRAFT N/A 07/04/2013   Procedure: CORONARY ARTERY BYPASS GRAFTING (CABG) times four using left internal mammary artery and right saphenous leg vein.;  Surgeon: Gaye Pollack, MD;  Location: [facility];  Service: Open Heart Surgery;  Laterality: N/A;  . INTRAOPERATIVE TRANSESOPHAGEAL ECHOCARDIOGRAM N/A 07/04/2013   Procedure: INTRAOPERATIVE TRANSESOPHAGEAL ECHOCARDIOGRAM;  Surgeon: Gaye Pollack, MD;  Location: [facility];  Service: Open Heart Surgery;  Laterality: N/A;  . LEFT HEART CATHETERIZATION WITH CORONARY ANGIOGRAM N/A 06/30/2013   Procedure: LEFT HEART CATHETERIZATION WITH CORONARY ANGIOGRAM;  Surgeon: Blane Ohara, MD;  Location: [facility] CATH LAB;  Service: Cardiovascular;  Laterality: N/A;  . NASAL SEPTUM SURGERY     childhood     No outpatient medications have been marked as taking for the 06/13/19 encounter (Appointment) with Richardson Dopp T, PA-C.     Allergies:  Metformin, Ace inhibitors, Amlodipine, Ampicillin, Atorvastatin, Codeine, Elavil [amitriptyline], Hctz [hydrochlorothiazide], Hytrin [terazosin], Melatonin, Trazodone, Wasp venom, and Erythromycin   Social History   Tobacco Use  . Smoking status: Never Smoker  . Smokeless tobacco: Never Used  Substance Use Topics  . Alcohol use: No  . Drug use: No     Family Hx: The patient's family history includes Cancer in her mother; Dementia in her mother; Diabetes in her father and paternal grandmother; Hypertension in her paternal grandmother.  ROS:    Please see the history of present illness.    *** All other systems reviewed and are negative.   Prior CV studies:   The following studies were reviewed today:  ***  Labs/Other Tests and Data Reviewed:    EKG:  {EKG/Telemetry Strips [phone]}  Recent Labs: No results found for requested labs within last 8760 hours.   Recent Lipid Panel Lab Results  Component Value Date/Time   CHOL 226 (H) 02/23/2017 03:07 AM   CHOL 289 (H) 06/28/2016 03:14 PM   TRIG 176 (H) 02/23/2017 03:07 AM   HDL 31 (L) 02/23/2017 03:07 AM   HDL 39 (L) 06/28/2016 03:14 PM   CHOLHDL 7.3 02/23/2017 03:07 AM   LDLCALC 160 (H) 02/23/2017 03:07 AM   LDLCALC 191 (H) 06/28/2016 03:14 PM   LDLDIRECT 227.0 11/30/2014 03:34 PM    Wt Readings from Last 3 Encounters:  09/13/17 160 lb 6.4 oz (72.8 kg)  05/28/17 163 lb 6.4 oz (74.1 kg)  03/30/17 158 lb (71.7 kg)     Objective:    Vital Signs:  There were no vitals taken for this visit.   {[facility] Virtual Exam (Optional):[phone]::"VITAL SIGNS:  reviewed"}  ASSESSMENT & PLAN:    *** 1. Coronary artery disease involving native coronary artery of native heart without angina pectoris:  The patient had many questions about her cardiac catheterization findings prior to surgery and what other options there were instead of surgery. I reviewed all this with her today and tried to answer all of her questions. She will be referred to cardiac rehabilitation in [facility]. Continue aspirin, statin. 2. Unspecified essential hypertension:  Blood pressure is uncontrolled. Increase losartan to 100 mg daily. Check a basic metabolic panel today and repeat in the next 2 weeks.  She was previously on HCTZ. This may need to be resumed if her blood pressure remains elevated. 3. Hyperlipidemia :  Continue statin. Arrange followup lipids and LFTs. 4. Carotid stenosis, bilateral:  She will likely need followup carotid US in 1 year. 5. Disposition: She does not want to  see Dr. Acie Fredrickson in follow up. I will arrange follow up with Dr. Burt Knack who performed her cardiac catheterization.   COVID-19 Education: The signs and symptoms of COVID-19 were discussed with the patient and how to seek care for testing (follow up with PCP or arrange E-visit).  ***The importance of social distancing was discussed today.  Time:   Today, I have spent *** minutes with the patient with telehealth technology discussing the above problems.     Medication Adjustments/Labs and Tests Ordered: Current medicines are reviewed at length with the patient today.  Concerns regarding medicines are outlined above.   Tests Ordered: No orders of the defined types were placed in this encounter.   Medication Changes: No orders of the defined types were placed in this encounter.   Follow Up:  {F/U Format:[phone]} {follow up:15908}  Signed, Richardson Dopp, PA-C  06/12/2019 5:30 PM    [facility]  [facility]

## 2019-06-13 ENCOUNTER — Telehealth: Payer: Medicare Other | Admitting: Physician Assistant

## 2019-06-13 ENCOUNTER — Encounter: Payer: Self-pay | Admitting: Physician Assistant

## 2019-06-13 ENCOUNTER — Other Ambulatory Visit: Payer: Self-pay

## 2019-06-17 DIAGNOSIS — J0100 Acute maxillary sinusitis, unspecified: Secondary | ICD-10-CM | POA: Diagnosis not present

## 2019-06-26 DIAGNOSIS — R519 Headache, unspecified: Secondary | ICD-10-CM | POA: Diagnosis not present

## 2019-06-26 DIAGNOSIS — K5900 Constipation, unspecified: Secondary | ICD-10-CM | POA: Diagnosis not present

## 2019-06-26 DIAGNOSIS — R110 Nausea: Secondary | ICD-10-CM | POA: Diagnosis not present

## 2019-07-07 DIAGNOSIS — E119 Type 2 diabetes mellitus without complications: Secondary | ICD-10-CM | POA: Diagnosis not present

## 2019-07-07 DIAGNOSIS — H524 Presbyopia: Secondary | ICD-10-CM | POA: Diagnosis not present

## 2019-07-07 DIAGNOSIS — H25013 Cortical age-related cataract, bilateral: Secondary | ICD-10-CM | POA: Diagnosis not present

## 2019-07-07 DIAGNOSIS — Z7984 Long term (current) use of oral hypoglycemic drugs: Secondary | ICD-10-CM | POA: Diagnosis not present

## 2019-07-07 DIAGNOSIS — H2513 Age-related nuclear cataract, bilateral: Secondary | ICD-10-CM | POA: Diagnosis not present

## 2019-07-10 DIAGNOSIS — D352 Benign neoplasm of pituitary gland: Secondary | ICD-10-CM | POA: Diagnosis not present

## 2019-07-14 DIAGNOSIS — E782 Mixed hyperlipidemia: Secondary | ICD-10-CM | POA: Diagnosis not present

## 2019-07-14 DIAGNOSIS — D352 Benign neoplasm of pituitary gland: Secondary | ICD-10-CM | POA: Diagnosis not present

## 2019-07-14 DIAGNOSIS — N183 Chronic kidney disease, stage 3 unspecified: Secondary | ICD-10-CM | POA: Diagnosis not present

## 2019-07-14 DIAGNOSIS — I129 Hypertensive chronic kidney disease with stage 1 through stage 4 chronic kidney disease, or unspecified chronic kidney disease: Secondary | ICD-10-CM | POA: Diagnosis not present

## 2019-07-14 DIAGNOSIS — E119 Type 2 diabetes mellitus without complications: Secondary | ICD-10-CM | POA: Diagnosis not present

## 2019-07-14 DIAGNOSIS — E559 Vitamin D deficiency, unspecified: Secondary | ICD-10-CM | POA: Diagnosis not present

## 2019-07-14 DIAGNOSIS — I10 Essential (primary) hypertension: Secondary | ICD-10-CM | POA: Diagnosis not present

## 2019-07-24 DIAGNOSIS — I129 Hypertensive chronic kidney disease with stage 1 through stage 4 chronic kidney disease, or unspecified chronic kidney disease: Secondary | ICD-10-CM | POA: Diagnosis not present

## 2019-07-24 DIAGNOSIS — N1832 Chronic kidney disease, stage 3b: Secondary | ICD-10-CM | POA: Diagnosis not present

## 2019-07-24 DIAGNOSIS — E559 Vitamin D deficiency, unspecified: Secondary | ICD-10-CM | POA: Diagnosis not present

## 2019-07-24 DIAGNOSIS — Z9114 Patient's other noncompliance with medication regimen: Secondary | ICD-10-CM | POA: Diagnosis not present

## 2019-07-24 DIAGNOSIS — E1122 Type 2 diabetes mellitus with diabetic chronic kidney disease: Secondary | ICD-10-CM | POA: Diagnosis not present

## 2019-08-01 ENCOUNTER — Ambulatory Visit: Payer: Medicare Other | Admitting: Cardiovascular Disease

## 2019-09-08 ENCOUNTER — Encounter: Payer: Self-pay | Admitting: *Deleted

## 2019-09-08 ENCOUNTER — Other Ambulatory Visit: Payer: Self-pay

## 2019-09-08 ENCOUNTER — Ambulatory Visit: Payer: Medicare Other | Admitting: Physician Assistant

## 2019-09-08 ENCOUNTER — Encounter: Payer: Self-pay | Admitting: Physician Assistant

## 2019-09-08 VITALS — BP 130/72 | HR 52 | Ht 65.0 in | Wt 153.4 lb

## 2019-09-08 DIAGNOSIS — I25700 Atherosclerosis of coronary artery bypass graft(s), unspecified, with unstable angina pectoris: Secondary | ICD-10-CM | POA: Diagnosis not present

## 2019-09-08 DIAGNOSIS — R079 Chest pain, unspecified: Secondary | ICD-10-CM

## 2019-09-08 DIAGNOSIS — E782 Mixed hyperlipidemia: Secondary | ICD-10-CM

## 2019-09-08 DIAGNOSIS — I6523 Occlusion and stenosis of bilateral carotid arteries: Secondary | ICD-10-CM

## 2019-09-08 MED ORDER — ISOSORBIDE MONONITRATE ER 30 MG PO TB24: 30.0000 mg | ORAL_TABLET | Freq: Every day | ORAL | 3 refills | Status: DC

## 2019-09-08 MED ORDER — NITROGLYCERIN 0.4 MG SL SUBL: 0.4000 mg | SUBLINGUAL_TABLET | SUBLINGUAL | 3 refills | Status: DC | PRN

## 2019-09-08 NOTE — Patient Instructions (Signed)
Medication Instructions:  Your physician has recommended you make the following change in your medication:  1.  START Imdur 30 mg taking 1 tablet daily 2.  START Nitroglycerin 0.4 s/l tablet.. USE ONLY AS NEEDED AS DIRECTED ON THE BOTTLE  *If you need a refill on your cardiac medications before your next appointment, please call your pharmacy*   Lab Work: None ordered  If you have labs (blood work) drawn today and your tests are completely normal, you will receive your results only by:  [place] (if you have MyChart) OR  A paper copy in the mail If you have any lab test that is abnormal or we need to change your treatment, we will call you to review the results.   Testing/Procedures: Your physician has requested that you have a lexiscan myoview. For further information please visit [email or web address]. Please follow instruction sheet, as given.  Your physician has requested that you have a carotid duplex. This test is an ultrasound of the carotid arteries in your neck. It looks at blood flow through these arteries that supply the brain with blood. Allow one hour for this exam. There are no restrictions or special instructions.     Follow-Up: At [facility], you and your health needs are our priority.  As part of our continuing mission to provide you with exceptional heart care, we have created designated Provider Care Teams.  These Care Teams include your primary Cardiologist (physician) and Advanced Practice Providers (APPs -  Physician Assistants and Nurse Practitioners) who all work together to provide you with the care you need, when you need it.  We recommend signing up for the patient portal called "MyChart".  Sign up information is provided on this After Visit Summary.  MyChart is used to connect with patients for Virtual Visits (Telemedicine).  Patients are able to view lab/test results, encounter notes, upcoming appointments, etc.  Non-urgent messages can be sent to  your provider as well.   To learn more about what you can do with MyChart, go to [email or web address].    Your next appointment:   3 month(s)  The format for your next appointment:   In Person  Provider:   You may see Sherren Mocha, MD or one of the following Advanced Practice Providers on your designated Care Team:    Richardson Dopp, PA-C  Robbie Lis, [place]    Other Instructions  Cardiac Nuclear Scan A cardiac nuclear scan is a test that is done to check the flow of blood to your heart. It is done when you are resting and when you are exercising. The test looks for problems such as:  Not enough blood reaching a portion of the heart.  The heart muscle not working as it should. You may need this test if:  You have heart disease.  You have had lab results that are not normal.  You have had heart surgery or a balloon procedure to open up blocked arteries (angioplasty).  You have chest pain.  You have shortness of breath. In this test, a special dye (tracer) is put into your bloodstream. The tracer will travel to your heart. A camera will then take pictures of your heart to see how the tracer moves through your heart. This test is usually done at a hospital and takes 2-4 hours. Tell a doctor about:  Any allergies you have.  All medicines you are taking, including vitamins, herbs, eye drops, creams, and over-the-counter medicines.  Any problems you or family members have had  with anesthetic medicines.  Any blood disorders you have.  Any surgeries you have had.  Any medical conditions you have.  Whether you are pregnant or may be pregnant. What are the risks? Generally, this is a safe test. However, problems may occur, such as:  Serious chest pain and heart attack. This is only a risk if the stress portion of the test is done.  Rapid heartbeat.  A feeling of warmth in your chest. This feeling usually does not last long.  Allergic reaction to the tracer. What  happens before the test?  Ask your doctor about changing or stopping your normal medicines. This is important.  Follow instructions from your doctor about what you cannot eat or drink.  Remove your jewelry on the day of the test. What happens during the test?  An IV tube will be inserted into one of your veins.  Your doctor will give you a small amount of tracer through the IV tube.  You will wait for 20-40 minutes while the tracer moves through your bloodstream.  Your heart will be monitored with an electrocardiogram (ECG).  You will lie down on an exam table.  Pictures of your heart will be taken for about 15-20 minutes.  You may also have a stress test. For this test, one of these things may be done: ? You will be asked to exercise on a treadmill or a stationary bike. ? You will be given medicines that will make your heart work harder. This is done if you are unable to exercise.  When blood flow to your heart has peaked, a tracer will again be given through the IV tube.  After 20-40 minutes, you will get back on the exam table. More pictures will be taken of your heart.  Depending on the tracer that is used, more pictures may need to be taken 3-4 hours later.  Your IV tube will be removed when the test is over. The test may vary among doctors and hospitals. What happens after the test?  Ask your doctor: ? Whether you can return to your normal schedule, including diet, activities, and medicines. ? Whether you should drink more fluids. This will help to remove the tracer from your body. Drink enough fluid to keep your pee (urine) pale yellow.  Ask your doctor, or the department that is doing the test: ? When will my results be ready? ? How will I get my results? Summary  A cardiac nuclear scan is a test that is done to check the flow of blood to your heart.  Tell your doctor whether you are pregnant or may be pregnant.  Before the test, ask your doctor about changing  or stopping your normal medicines. This is important.  Ask your doctor whether you can return to your normal activities. You may be asked to drink more fluids. This information is not intended to replace advice given to you by your health care provider. Make sure you discuss any questions you have with your health care provider. Document Revised: 06/05/2018 Document Reviewed: 07/30/2017 Elsevier Patient Education  [place].

## 2019-09-08 NOTE — Progress Notes (Signed)
Cardiology Office Note    Date:  09/08/2019   ID:  Amanda Curtis, DOB 11-05-1935, MRN [id number]  PCP:  Jilda Panda, MD  Cardiologist:  Dr. Burt Knack  Chief Complaint:  24 Months follow up  History of Present Illness:   Amanda Curtis is a 84 y.o. female with hx of CAD s/p CABG in 2015, HTN, HLD, DM, Stoke in 2018, pituitary adenoma and carotid disease seen for long due follow up.   Hx of stoke in 01/2017. Imaging studies were suggestive of left MCA territory infarcts, athero-versus cardioembolic source.  Her aspirin dose was increased to 325 mg daily.  She has been found to have moderate left internal carotid artery stenosis and moderate to severe right internal carotid artery stenosis.  She saw vascular surgery who discussed right carotid endarterectomy (felt significant enough to be treated) but the patient declined after hearing the potential risks. Echo with normal LV systolic function, no significant valvular disease, and normal atrial size.  Last seen by Dr. Burt Knack 05/2017. Event monitor without arrhythmias on 05/2017.  Here today for follow-up.  Lives independently.  Patient reports longstanding history of intermittent substernal chest pressure without any associated symptoms.  Cannot tell duration and intensity.  Since some memory issue.  Reports muscle ache on statins.  No palpitation, dizziness, orthopnea, PND, syncope, lower extremity edema or melena.  Past Medical History:  Diagnosis Date  . Arthritis   . Diabetes mellitus without complication ([place])   . Graves disease 2000   s/p RIA  . Hypertension   . Insomnia   . Pituitary tumor   . Stroke ([place])   . Vitamin D deficiency     Past Surgical History:  Procedure Laterality Date  . APPENDECTOMY    . CORONARY ARTERY BYPASS GRAFT N/A 07/04/2013   Procedure: CORONARY ARTERY BYPASS GRAFTING (CABG) times four using left internal mammary artery and right saphenous leg vein.;  Surgeon: Gaye Pollack, MD;  Location: [facility] OR;   Service: Open Heart Surgery;  Laterality: N/A;  . INTRAOPERATIVE TRANSESOPHAGEAL ECHOCARDIOGRAM N/A 07/04/2013   Procedure: INTRAOPERATIVE TRANSESOPHAGEAL ECHOCARDIOGRAM;  Surgeon: Gaye Pollack, MD;  Location: [facility] OR;  Service: Open Heart Surgery;  Laterality: N/A;  . LEFT HEART CATHETERIZATION WITH CORONARY ANGIOGRAM N/A 06/30/2013   Procedure: LEFT HEART CATHETERIZATION WITH CORONARY ANGIOGRAM;  Surgeon: Blane Ohara, MD;  Location: [facility] CATH LAB;  Service: Cardiovascular;  Laterality: N/A;  . NASAL SEPTUM SURGERY     childhood    Current Medications: Prior to Admission medications   Medication Sig Start Date End Date Taking? Authorizing Provider  acetaminophen (TYLENOL) 325 MG tablet Take 2 tablets (650 mg total) by mouth every 6 (six) hours as needed for mild pain. 07/09/13   Coolidge Breeze, PA-C  amLODipine (NORVASC) 5 MG tablet Take by mouth. 08/26/14   [provider]  aspirin 325 MG EC tablet Take 1 tablet (325 mg total) by mouth daily. 02/25/17   Nita Sells, MD  chlorthalidone (HYGROTON) 25 MG tablet TAKE 1 TABLET BY MOUTH DAILY 06/07/17   Sherren Mocha, MD  hydrALAZINE (APRESOLINE) 10 MG tablet Take by mouth. 07/15/14   [provider]  irbesartan (AVAPRO) 75 MG tablet Take 75 mg by mouth daily.    [provider]  levothyroxine (SYNTHROID, LEVOTHROID) 100 MCG tablet Take 1 tablet by mouth daily. 04/05/17 04/05/18  [provider]  Pitavastatin Calcium 2 MG TABS Take 1 tablet (2 mg total) by mouth daily. 05/28/17 05/23/18  Burt Knack,  Legrand Como, MD  pravastatin (PRAVACHOL) 80 MG tablet Take by mouth. 07/15/14   [provider]  SYNTHROID 112 MCG tablet  06/13/17   [provider]  TRADJENTA 5 MG TABS tablet Take 1 tablet by mouth daily at 12 noon. 05/21/17   [provider]    Allergies:   Metformin, Ace inhibitors, Amlodipine, Ampicillin, Atorvastatin, Codeine, Elavil [amitriptyline], Hctz [hydrochlorothiazide], Hytrin  [terazosin], Melatonin, Trazodone, Wasp venom, and Erythromycin   Social History   Socioeconomic History  . Marital status: Widowed    Spouse name: Not on file  . Number of children: Not on file  . Years of education: Not on file  . Highest education level: Not on file  Occupational History  . Not on file  Tobacco Use  . Smoking status: Never Smoker  . Smokeless tobacco: Never Used  Substance and Sexual Activity  . Alcohol use: No  . Drug use: No  . Sexual activity: Not on file  Other Topics Concern  . Not on file  Social History Narrative   Widow   Retired worked for [employer] and did a lot of travelling for [employer].   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   She has 1 son- lives in [place]   3 grandsons- college age (one is in [place])            Social Determinants of Health   Financial Resource Strain:   . Difficulty of Paying Living Expenses:   Food Insecurity:   . Worried About Charity fundraiser in the Last Year:   . Arboriculturist in the Last Year:   Transportation Needs:   . Film/video editor (Medical):   [name] Lack of Transportation (Non-Medical):   Physical Activity:   . Days of Exercise per Week:   . Minutes of Exercise per Session:   Stress:   . Feeling of Stress :   Social Connections:   . Frequency of Communication with Friends and Family:   . Frequency of Social Gatherings with Friends and Family:   . Attends Religious Services:   . Active Member of Clubs or Organizations:   . Attends Archivist Meetings:   [name] Marital Status:      Family History:  The patient's family history includes Cancer in her mother; Dementia in her mother; Diabetes in her father and paternal grandmother; Hypertension in her paternal grandmother.   ROS:   Please see the history of present illness.    ROS All other systems reviewed and are negative.   PHYSICAL EXAM:     VS:  BP 130/72   Pulse (!) 52   Ht 5\' 5"  (1.651 m)   Wt 153 lb 6.4 oz (69.6 kg)   SpO2 97%   BMI 25.53 kg/m    GEN: Well nourished, well developed, in no acute distress  HEENT: normal  Neck: no JVD, carotid bruits, or masses Cardiac: RRR; no murmurs, rubs, or gallops,no edema  Respiratory:  clear to auscultation bilaterally, normal work of breathing GI: soft, nontender, nondistended, + BS MS: no deformity or atrophy  Skin: warm and dry, no rash Neuro:  Alert and Oriented x 3, Strength and sensation are intact Psych: euthymic mood, full affect  Wt Readings from Last 3 Encounters:  09/08/19 153 lb 6.4 oz (69.6 kg)  09/13/17 160 lb 6.4 oz (72.8 kg)  05/28/17 163 lb 6.4 oz (  74.1 kg)      Studies/Labs Reviewed:   EKG:  EKG is ordered today.  The ekg ordered today demonstrates sinus bradycardia at rate of 54 bpm  Recent Labs: No results found for requested labs within last 8760 hours.   Lipid Panel    Component Value Date/Time   CHOL 226 (H) 02/23/2017 0307   CHOL 289 (H) 06/28/2016 1514   TRIG 176 (H) 02/23/2017 0307   HDL 31 (L) 02/23/2017 0307   HDL 39 (L) 06/28/2016 1514   CHOLHDL 7.3 02/23/2017 0307   VLDL 35 02/23/2017 0307   LDLCALC 160 (H) 02/23/2017 0307   LDLCALC 191 (H) 06/28/2016 1514   LDLDIRECT 227.0 11/30/2014 1534    Additional studies/ records that were reviewed today include:   Echocardiogram: 01/2017 - Left ventricle: The cavity size was normal. There was mild  concentric hypertrophy. Systolic function was vigorous. The  estimated ejection fraction was in the range of 65% to 70%. Wall  motion was normal; there were no regional wall motion  abnormalities. Doppler parameters are consistent with abnormal  left ventricular relaxation (grade 1 diastolic dysfunction).  Doppler parameters are consistent with elevated ventricular  end-diastolic filling pressure.  - Aortic valve: Trileaflet; mildly thickened, mildly calcified  leaflets.  There was no regurgitation.  - Mitral valve: Calcified annulus. Mildly thickened leaflets .  There was no regurgitation.  - Right ventricle: The cavity size was normal. Wall thickness was  normal. Systolic function was normal.  - Right atrium: The atrium was normal in size.  - Tricuspid valve: There was trivial regurgitation.  - Pulmonary arteries: Systolic pressure was within the normal  range.  - Inferior vena cava: The vessel was normal in size.  - Pericardium, extracardiac: There was no pericardial effusion.   Monitor 05/2017 The basic rhythm is sinus rhythm/sinus bradycardia with an average heart rate of 59 bpm No evidence of atrial fibrillation or flutter No ventricular arrhythmia identified No pathologic pauses or significant bradycardic events noted  Carotid doppler 08/2017 Final Interpretation:  Right Carotid: Velocities in the right ICA are consistent with a 60-79%         stenosis.   Left Carotid: Velocities in the left ICA are consistent with a 1-39%  stenosis.   Vertebrals: Bilateral vertebral arteries demonstrate antegrade flow.    ASSESSMENT & PLAN:    1. Chest pain with history of CAD s/p CABG -History unreliable.  Continue aspirin 325 mg daily (due to history of stroke and carotid disease).  Get stress test.  Start Imdur.  Use as needed sublingual nitroglycerin.  2. Carotid artery disease - Offered ECA at time of stroke but declined. Last doopler 08/2017 showed stable RICA -Continue aspirin -Repeat study  3. HTN -Blood pressure stable on current medications  4. HLD -Reports history of intolerance to statin.  Medication Adjustments/Labs and Tests Ordered: Current medicines are reviewed at length with the patient today.  Concerns regarding medicines are outlined above.  Medication changes, Labs and Tests ordered today are listed in the Patient Instructions below. Patient Instructions  Medication Instructions:  Your physician has recommended  you make the following change in your medication:  1.  START Imdur 30 mg taking 1 tablet daily 2.  START Nitroglycerin 0.4 s/l tablet.. USE ONLY AS NEEDED AS DIRECTED ON THE BOTTLE  *If you need a refill on your cardiac medications before your next appointment, please call your pharmacy*   Lab Work: None ordered  If you have labs (blood work) drawn  today and your tests are completely normal, you will receive your results only by: [name] MyChart Message (if you have MyChart) OR . A paper copy in the mail If you have any lab test that is abnormal or we need to change your treatment, we will call you to review the results.   Testing/Procedures: Your physician has requested that you have a lexiscan myoview. For further information please visit [email or web address]. Please follow instruction sheet, as given.  Your physician has requested that you have a carotid duplex. This test is an ultrasound of the carotid arteries in your neck. It looks at blood flow through these arteries that supply the brain with blood. Allow one hour for this exam. There are no restrictions or special instructions.     Follow-Up: At [facility], you and your health needs are our priority.  As part of our continuing mission to provide you with exceptional heart care, we have created designated Provider Care Teams.  These Care Teams include your primary Cardiologist (physician) and Advanced Practice Providers (APPs -  Physician Assistants and Nurse Practitioners) who all work together to provide you with the care you need, when you need it.  We recommend signing up for the patient portal called "MyChart".  Sign up information is provided on this After Visit Summary.  MyChart is used to connect with patients for Virtual Visits (Telemedicine).  Patients are able to view lab/test results, encounter notes, upcoming appointments, etc.  Non-urgent messages can be sent to your provider as well.   To learn more about what you can  do with MyChart, go to [email or web address].    Your next appointment:   3 month(s)  The format for your next appointment:   In Person  Provider:   You may see Sherren Mocha, MD or one of the following Advanced Practice Providers on your designated Care Team:    Richardson Dopp, PA-C  Robbie Lis, [place]    Other Instructions  Cardiac Nuclear Scan A cardiac nuclear scan is a test that is done to check the flow of blood to your heart. It is done when you are resting and when you are exercising. The test looks for problems such as:  Not enough blood reaching a portion of the heart.  The heart muscle not working as it should. You may need this test if:  You have heart disease.  You have had lab results that are not normal.  You have had heart surgery or a balloon procedure to open up blocked arteries (angioplasty).  You have chest pain.  You have shortness of breath. In this test, a special dye (tracer) is put into your bloodstream. The tracer will travel to your heart. A camera will then take pictures of your heart to see how the tracer moves through your heart. This test is usually done at a hospital and takes 2-4 hours. Tell a doctor about:  Any allergies you have.  All medicines you are taking, including vitamins, herbs, eye drops, creams, and over-the-counter medicines.  Any problems you or family members have had with anesthetic medicines.  Any blood disorders you have.  Any surgeries you have had.  Any medical conditions you have.  Whether you are pregnant or may be pregnant. What are the risks? Generally, this is a safe test. However, problems may occur, such as:  Serious chest pain and heart attack. This is only a risk if the stress portion of the test is done.  Rapid heartbeat.  A feeling of  warmth in your chest. This feeling usually does not last long.  Allergic reaction to the tracer. What happens before the test?  Ask your doctor about changing  or stopping your normal medicines. This is important.  Follow instructions from your doctor about what you cannot eat or drink.  Remove your jewelry on the day of the test. What happens during the test?  An IV tube will be inserted into one of your veins.  Your doctor will give you a small amount of tracer through the IV tube.  You will wait for 20-40 minutes while the tracer moves through your bloodstream.  Your heart will be monitored with an electrocardiogram (ECG).  You will lie down on an exam table.  Pictures of your heart will be taken for about 15-20 minutes.  You may also have a stress test. For this test, one of these things may be done: ? You will be asked to exercise on a treadmill or a stationary bike. ? You will be given medicines that will make your heart work harder. This is done if you are unable to exercise.  When blood flow to your heart has peaked, a tracer will again be given through the IV tube.  After 20-40 minutes, you will get back on the exam table. More pictures will be taken of your heart.  Depending on the tracer that is used, more pictures may need to be taken 3-4 hours later.  Your IV tube will be removed when the test is over. The test may vary among doctors and hospitals. What happens after the test?  Ask your doctor: ? Whether you can return to your normal schedule, including diet, activities, and medicines. ? Whether you should drink more fluids. This will help to remove the tracer from your body. Drink enough fluid to keep your pee (urine) pale yellow.  Ask your doctor, or the department that is doing the test: ? When will my results be ready? ? How will I get my results? Summary  A cardiac nuclear scan is a test that is done to check the flow of blood to your heart.  Tell your doctor whether you are pregnant or may be pregnant.  Before the test, ask your doctor about changing or stopping your normal medicines. This is  important.  Ask your doctor whether you can return to your normal activities. You may be asked to drink more fluids. This information is not intended to replace advice given to you by your health care provider. Make sure you discuss any questions you have with your health care provider. Document Revised: 06/05/2018 Document Reviewed: 07/30/2017 Elsevier Patient Education  [place], [place], [place]  09/08/2019 4:26 PM    [facility] [place], [place], [place]  [address] Phone: [phone]; Fax: [phone]

## 2019-09-10 ENCOUNTER — Telehealth (HOSPITAL_COMMUNITY): Payer: Self-pay | Admitting: *Deleted

## 2019-09-10 NOTE — Telephone Encounter (Signed)
Patient given detailed instructions per Myocardial Perfusion Study Information Sheet for the test on 09/15/19 at 10:30. Patient notified to arrive 15 minutes early and that it is imperative to arrive on time for appointment to keep from having the test rescheduled.  If you need to cancel or reschedule your appointment, please call the office within 24 hours of your appointment. . Patient verbalized understanding.Amanda Curtis

## 2019-09-15 ENCOUNTER — Encounter (HOSPITAL_COMMUNITY): Payer: Medicare Other

## 2019-09-23 ENCOUNTER — Telehealth (HOSPITAL_COMMUNITY): Payer: Self-pay | Admitting: Physician Assistant

## 2019-09-23 NOTE — Telephone Encounter (Signed)
Called patient to reschedule and she declined to schedule and if she  decides to have after she feels better she will call us back and hung up on me. Order will be removed from the [facility] and if she calls back we can reinstate the order.

## 2019-09-30 ENCOUNTER — Ambulatory Visit (HOSPITAL_COMMUNITY)
Admission: RE | Admit: 2019-09-30 | Payer: Medicare Other | Source: Ambulatory Visit | Attending: Physician Assistant | Admitting: Physician Assistant

## 2019-10-13 DIAGNOSIS — N182 Chronic kidney disease, stage 2 (mild): Secondary | ICD-10-CM | POA: Diagnosis not present

## 2019-10-13 DIAGNOSIS — M6281 Muscle weakness (generalized): Secondary | ICD-10-CM | POA: Diagnosis not present

## 2019-10-13 DIAGNOSIS — I63231 Cerebral infarction due to unspecified occlusion or stenosis of right carotid arteries: Secondary | ICD-10-CM | POA: Diagnosis not present

## 2019-10-13 DIAGNOSIS — I7090 Unspecified atherosclerosis: Secondary | ICD-10-CM | POA: Diagnosis not present

## 2019-10-13 DIAGNOSIS — I639 Cerebral infarction, unspecified: Secondary | ICD-10-CM | POA: Diagnosis not present

## 2019-10-13 DIAGNOSIS — R479 Unspecified speech disturbances: Secondary | ICD-10-CM | POA: Diagnosis not present

## 2019-10-13 DIAGNOSIS — I6389 Other cerebral infarction: Secondary | ICD-10-CM | POA: Diagnosis not present

## 2019-10-13 DIAGNOSIS — R930 Abnormal findings on diagnostic imaging of skull and head, not elsewhere classified: Secondary | ICD-10-CM | POA: Diagnosis not present

## 2019-10-13 DIAGNOSIS — R2681 Unsteadiness on feet: Secondary | ICD-10-CM | POA: Diagnosis not present

## 2019-10-13 DIAGNOSIS — D539 Nutritional anemia, unspecified: Secondary | ICD-10-CM | POA: Diagnosis not present

## 2019-10-13 DIAGNOSIS — I6381 Other cerebral infarction due to occlusion or stenosis of small artery: Secondary | ICD-10-CM | POA: Diagnosis not present

## 2019-10-13 DIAGNOSIS — D352 Benign neoplasm of pituitary gland: Secondary | ICD-10-CM | POA: Diagnosis not present

## 2019-10-13 DIAGNOSIS — I708 Atherosclerosis of other arteries: Secondary | ICD-10-CM | POA: Diagnosis not present

## 2019-10-13 DIAGNOSIS — R6889 Other general symptoms and signs: Secondary | ICD-10-CM | POA: Diagnosis not present

## 2019-10-13 DIAGNOSIS — I771 Stricture of artery: Secondary | ICD-10-CM | POA: Diagnosis not present

## 2019-10-13 DIAGNOSIS — E785 Hyperlipidemia, unspecified: Secondary | ICD-10-CM | POA: Diagnosis not present

## 2019-10-13 DIAGNOSIS — R001 Bradycardia, unspecified: Secondary | ICD-10-CM | POA: Diagnosis not present

## 2019-10-13 DIAGNOSIS — L0211 Cutaneous abscess of neck: Secondary | ICD-10-CM | POA: Diagnosis not present

## 2019-10-13 DIAGNOSIS — N179 Acute kidney failure, unspecified: Secondary | ICD-10-CM | POA: Diagnosis not present

## 2019-10-13 DIAGNOSIS — G319 Degenerative disease of nervous system, unspecified: Secondary | ICD-10-CM | POA: Diagnosis not present

## 2019-10-13 DIAGNOSIS — I2510 Atherosclerotic heart disease of native coronary artery without angina pectoris: Secondary | ICD-10-CM | POA: Diagnosis not present

## 2019-10-13 DIAGNOSIS — T45515A Adverse effect of anticoagulants, initial encounter: Secondary | ICD-10-CM | POA: Diagnosis not present

## 2019-10-13 DIAGNOSIS — E0500 Thyrotoxicosis with diffuse goiter without thyrotoxic crisis or storm: Secondary | ICD-10-CM | POA: Diagnosis not present

## 2019-10-13 DIAGNOSIS — E538 Deficiency of other specified B group vitamins: Secondary | ICD-10-CM | POA: Diagnosis not present

## 2019-10-13 DIAGNOSIS — E1122 Type 2 diabetes mellitus with diabetic chronic kidney disease: Secondary | ICD-10-CM | POA: Diagnosis not present

## 2019-10-13 DIAGNOSIS — E539 Vitamin B deficiency, unspecified: Secondary | ICD-10-CM | POA: Diagnosis not present

## 2019-10-13 DIAGNOSIS — D62 Acute posthemorrhagic anemia: Secondary | ICD-10-CM | POA: Diagnosis not present

## 2019-10-13 DIAGNOSIS — N183 Chronic kidney disease, stage 3 unspecified: Secondary | ICD-10-CM | POA: Diagnosis not present

## 2019-10-13 DIAGNOSIS — I6523 Occlusion and stenosis of bilateral carotid arteries: Secondary | ICD-10-CM | POA: Diagnosis not present

## 2019-10-13 DIAGNOSIS — I700 Atherosclerosis of aorta: Secondary | ICD-10-CM | POA: Diagnosis not present

## 2019-10-13 DIAGNOSIS — Z9889 Other specified postprocedural states: Secondary | ICD-10-CM | POA: Diagnosis not present

## 2019-10-13 DIAGNOSIS — T39015A Adverse effect of aspirin, initial encounter: Secondary | ICD-10-CM | POA: Diagnosis not present

## 2019-10-13 DIAGNOSIS — I340 Nonrheumatic mitral (valve) insufficiency: Secondary | ICD-10-CM | POA: Diagnosis not present

## 2019-10-13 DIAGNOSIS — R278 Other lack of coordination: Secondary | ICD-10-CM | POA: Diagnosis not present

## 2019-10-13 DIAGNOSIS — Z20822 Contact with and (suspected) exposure to covid-19: Secondary | ICD-10-CM | POA: Diagnosis not present

## 2019-10-13 DIAGNOSIS — R4702 Dysphasia: Secondary | ICD-10-CM | POA: Diagnosis not present

## 2019-10-13 DIAGNOSIS — R1312 Dysphagia, oropharyngeal phase: Secondary | ICD-10-CM | POA: Diagnosis not present

## 2019-10-13 DIAGNOSIS — E119 Type 2 diabetes mellitus without complications: Secondary | ICD-10-CM | POA: Diagnosis not present

## 2019-10-13 DIAGNOSIS — G9340 Encephalopathy, unspecified: Secondary | ICD-10-CM | POA: Diagnosis not present

## 2019-10-13 DIAGNOSIS — J3489 Other specified disorders of nose and nasal sinuses: Secondary | ICD-10-CM | POA: Diagnosis not present

## 2019-10-13 DIAGNOSIS — R231 Pallor: Secondary | ICD-10-CM | POA: Diagnosis not present

## 2019-10-13 DIAGNOSIS — L7632 Postprocedural hematoma of skin and subcutaneous tissue following other procedure: Secondary | ICD-10-CM | POA: Diagnosis not present

## 2019-10-13 DIAGNOSIS — G459 Transient cerebral ischemic attack, unspecified: Secondary | ICD-10-CM | POA: Diagnosis not present

## 2019-10-13 DIAGNOSIS — D6832 Hemorrhagic disorder due to extrinsic circulating anticoagulants: Secondary | ICD-10-CM | POA: Diagnosis not present

## 2019-10-13 DIAGNOSIS — E039 Hypothyroidism, unspecified: Secondary | ICD-10-CM | POA: Diagnosis not present

## 2019-10-13 DIAGNOSIS — R269 Unspecified abnormalities of gait and mobility: Secondary | ICD-10-CM | POA: Diagnosis not present

## 2019-10-13 DIAGNOSIS — R5381 Other malaise: Secondary | ICD-10-CM | POA: Diagnosis not present

## 2019-10-13 DIAGNOSIS — I10 Essential (primary) hypertension: Secondary | ICD-10-CM | POA: Diagnosis not present

## 2019-10-13 DIAGNOSIS — T45525A Adverse effect of antithrombotic drugs, initial encounter: Secondary | ICD-10-CM | POA: Diagnosis not present

## 2019-10-13 DIAGNOSIS — Z885 Allergy status to narcotic agent status: Secondary | ICD-10-CM | POA: Diagnosis not present

## 2019-10-13 DIAGNOSIS — I6521 Occlusion and stenosis of right carotid artery: Secondary | ICD-10-CM | POA: Diagnosis not present

## 2019-10-13 DIAGNOSIS — I25810 Atherosclerosis of coronary artery bypass graft(s) without angina pectoris: Secondary | ICD-10-CM | POA: Diagnosis not present

## 2019-10-13 DIAGNOSIS — I6503 Occlusion and stenosis of bilateral vertebral arteries: Secondary | ICD-10-CM | POA: Diagnosis not present

## 2019-10-13 DIAGNOSIS — Z7409 Other reduced mobility: Secondary | ICD-10-CM | POA: Diagnosis not present

## 2019-10-13 DIAGNOSIS — M2550 Pain in unspecified joint: Secondary | ICD-10-CM | POA: Diagnosis not present

## 2019-10-13 DIAGNOSIS — R2689 Other abnormalities of gait and mobility: Secondary | ICD-10-CM | POA: Diagnosis not present

## 2019-10-13 DIAGNOSIS — Z7401 Bed confinement status: Secondary | ICD-10-CM | POA: Diagnosis not present

## 2019-10-13 DIAGNOSIS — R52 Pain, unspecified: Secondary | ICD-10-CM | POA: Diagnosis not present

## 2019-10-13 DIAGNOSIS — E7800 Pure hypercholesterolemia, unspecified: Secondary | ICD-10-CM | POA: Diagnosis not present

## 2019-10-13 DIAGNOSIS — I129 Hypertensive chronic kidney disease with stage 1 through stage 4 chronic kidney disease, or unspecified chronic kidney disease: Secondary | ICD-10-CM | POA: Diagnosis not present

## 2019-10-13 DIAGNOSIS — Z881 Allergy status to other antibiotic agents status: Secondary | ICD-10-CM | POA: Diagnosis not present

## 2019-10-13 DIAGNOSIS — I371 Nonrheumatic pulmonary valve insufficiency: Secondary | ICD-10-CM | POA: Diagnosis not present

## 2019-10-13 DIAGNOSIS — Z743 Need for continuous supervision: Secondary | ICD-10-CM | POA: Diagnosis not present

## 2019-10-16 ENCOUNTER — Ambulatory Visit (HOSPITAL_COMMUNITY)
Admission: RE | Admit: 2019-10-16 | Payer: Medicare Other | Source: Ambulatory Visit | Attending: Physician Assistant | Admitting: Physician Assistant

## 2019-10-23 DIAGNOSIS — Z20822 Contact with and (suspected) exposure to covid-19: Secondary | ICD-10-CM | POA: Diagnosis not present

## 2019-10-23 DIAGNOSIS — N183 Chronic kidney disease, stage 3 unspecified: Secondary | ICD-10-CM | POA: Diagnosis not present

## 2019-10-23 DIAGNOSIS — I639 Cerebral infarction, unspecified: Secondary | ICD-10-CM | POA: Diagnosis not present

## 2019-10-23 DIAGNOSIS — R9431 Abnormal electrocardiogram [ECG] [EKG]: Secondary | ICD-10-CM | POA: Diagnosis not present

## 2019-10-23 DIAGNOSIS — D352 Benign neoplasm of pituitary gland: Secondary | ICD-10-CM | POA: Diagnosis not present

## 2019-10-23 DIAGNOSIS — R278 Other lack of coordination: Secondary | ICD-10-CM | POA: Diagnosis not present

## 2019-10-23 DIAGNOSIS — Z743 Need for continuous supervision: Secondary | ICD-10-CM | POA: Diagnosis not present

## 2019-10-23 DIAGNOSIS — R002 Palpitations: Secondary | ICD-10-CM | POA: Diagnosis not present

## 2019-10-23 DIAGNOSIS — I6389 Other cerebral infarction: Secondary | ICD-10-CM | POA: Diagnosis not present

## 2019-10-23 DIAGNOSIS — R1312 Dysphagia, oropharyngeal phase: Secondary | ICD-10-CM | POA: Diagnosis not present

## 2019-10-23 DIAGNOSIS — I700 Atherosclerosis of aorta: Secondary | ICD-10-CM | POA: Diagnosis not present

## 2019-10-23 DIAGNOSIS — R2689 Other abnormalities of gait and mobility: Secondary | ICD-10-CM | POA: Diagnosis not present

## 2019-10-23 DIAGNOSIS — E1122 Type 2 diabetes mellitus with diabetic chronic kidney disease: Secondary | ICD-10-CM | POA: Diagnosis not present

## 2019-10-23 DIAGNOSIS — I779 Disorder of arteries and arterioles, unspecified: Secondary | ICD-10-CM | POA: Diagnosis not present

## 2019-10-23 DIAGNOSIS — I63219 Cerebral infarction due to unspecified occlusion or stenosis of unspecified vertebral arteries: Secondary | ICD-10-CM | POA: Diagnosis not present

## 2019-10-23 DIAGNOSIS — E785 Hyperlipidemia, unspecified: Secondary | ICD-10-CM | POA: Diagnosis not present

## 2019-10-23 DIAGNOSIS — D72828 Other elevated white blood cell count: Secondary | ICD-10-CM | POA: Diagnosis not present

## 2019-10-23 DIAGNOSIS — M6281 Muscle weakness (generalized): Secondary | ICD-10-CM | POA: Diagnosis not present

## 2019-10-23 DIAGNOSIS — E539 Vitamin B deficiency, unspecified: Secondary | ICD-10-CM | POA: Diagnosis not present

## 2019-10-23 DIAGNOSIS — R931 Abnormal findings on diagnostic imaging of heart and coronary circulation: Secondary | ICD-10-CM | POA: Diagnosis not present

## 2019-10-23 DIAGNOSIS — Z9889 Other specified postprocedural states: Secondary | ICD-10-CM | POA: Diagnosis not present

## 2019-10-23 DIAGNOSIS — I10 Essential (primary) hypertension: Secondary | ICD-10-CM | POA: Diagnosis not present

## 2019-10-23 DIAGNOSIS — Z7401 Bed confinement status: Secondary | ICD-10-CM | POA: Diagnosis not present

## 2019-10-23 DIAGNOSIS — R5381 Other malaise: Secondary | ICD-10-CM | POA: Diagnosis not present

## 2019-10-23 DIAGNOSIS — E119 Type 2 diabetes mellitus without complications: Secondary | ICD-10-CM | POA: Diagnosis not present

## 2019-10-23 DIAGNOSIS — R2681 Unsteadiness on feet: Secondary | ICD-10-CM | POA: Diagnosis not present

## 2019-10-23 DIAGNOSIS — M2550 Pain in unspecified joint: Secondary | ICD-10-CM | POA: Diagnosis not present

## 2019-10-23 DIAGNOSIS — I150 Renovascular hypertension: Secondary | ICD-10-CM | POA: Diagnosis not present

## 2019-10-23 DIAGNOSIS — R079 Chest pain, unspecified: Secondary | ICD-10-CM | POA: Diagnosis not present

## 2019-10-23 DIAGNOSIS — D473 Essential (hemorrhagic) thrombocythemia: Secondary | ICD-10-CM | POA: Diagnosis not present

## 2019-10-23 DIAGNOSIS — Z8673 Personal history of transient ischemic attack (TIA), and cerebral infarction without residual deficits: Secondary | ICD-10-CM | POA: Diagnosis not present

## 2019-10-24 DIAGNOSIS — E785 Hyperlipidemia, unspecified: Secondary | ICD-10-CM | POA: Diagnosis not present

## 2019-10-24 DIAGNOSIS — I63219 Cerebral infarction due to unspecified occlusion or stenosis of unspecified vertebral arteries: Secondary | ICD-10-CM | POA: Diagnosis not present

## 2019-10-24 DIAGNOSIS — I6389 Other cerebral infarction: Secondary | ICD-10-CM | POA: Diagnosis not present

## 2019-10-24 DIAGNOSIS — D352 Benign neoplasm of pituitary gland: Secondary | ICD-10-CM | POA: Diagnosis not present

## 2019-10-24 DIAGNOSIS — I779 Disorder of arteries and arterioles, unspecified: Secondary | ICD-10-CM | POA: Diagnosis not present

## 2019-10-28 DIAGNOSIS — Z9889 Other specified postprocedural states: Secondary | ICD-10-CM | POA: Diagnosis not present

## 2019-10-28 DIAGNOSIS — I150 Renovascular hypertension: Secondary | ICD-10-CM | POA: Diagnosis not present

## 2019-10-28 DIAGNOSIS — Z8673 Personal history of transient ischemic attack (TIA), and cerebral infarction without residual deficits: Secondary | ICD-10-CM | POA: Diagnosis not present

## 2019-10-28 DIAGNOSIS — R002 Palpitations: Secondary | ICD-10-CM | POA: Diagnosis not present

## 2019-10-29 DIAGNOSIS — R931 Abnormal findings on diagnostic imaging of heart and coronary circulation: Secondary | ICD-10-CM | POA: Diagnosis not present

## 2019-10-29 DIAGNOSIS — R9431 Abnormal electrocardiogram [ECG] [EKG]: Secondary | ICD-10-CM | POA: Diagnosis not present

## 2019-10-29 DIAGNOSIS — Z9889 Other specified postprocedural states: Secondary | ICD-10-CM | POA: Diagnosis not present

## 2019-10-29 DIAGNOSIS — R002 Palpitations: Secondary | ICD-10-CM | POA: Diagnosis not present

## 2019-10-31 DIAGNOSIS — Z9889 Other specified postprocedural states: Secondary | ICD-10-CM | POA: Diagnosis not present

## 2019-10-31 DIAGNOSIS — I6389 Other cerebral infarction: Secondary | ICD-10-CM | POA: Diagnosis not present

## 2019-11-01 DIAGNOSIS — I10 Essential (primary) hypertension: Secondary | ICD-10-CM | POA: Diagnosis not present

## 2019-11-05 DIAGNOSIS — I10 Essential (primary) hypertension: Secondary | ICD-10-CM | POA: Diagnosis not present

## 2019-11-06 DIAGNOSIS — E119 Type 2 diabetes mellitus without complications: Secondary | ICD-10-CM | POA: Diagnosis not present

## 2019-11-06 DIAGNOSIS — R931 Abnormal findings on diagnostic imaging of heart and coronary circulation: Secondary | ICD-10-CM | POA: Diagnosis not present

## 2019-11-06 DIAGNOSIS — I700 Atherosclerosis of aorta: Secondary | ICD-10-CM | POA: Diagnosis not present

## 2019-11-06 DIAGNOSIS — D72828 Other elevated white blood cell count: Secondary | ICD-10-CM | POA: Diagnosis not present

## 2019-11-06 DIAGNOSIS — D473 Essential (hemorrhagic) thrombocythemia: Secondary | ICD-10-CM | POA: Diagnosis not present

## 2019-11-09 DIAGNOSIS — D352 Benign neoplasm of pituitary gland: Secondary | ICD-10-CM | POA: Diagnosis not present

## 2019-11-09 DIAGNOSIS — I6521 Occlusion and stenosis of right carotid artery: Secondary | ICD-10-CM | POA: Diagnosis not present

## 2019-11-09 DIAGNOSIS — I69398 Other sequelae of cerebral infarction: Secondary | ICD-10-CM | POA: Diagnosis not present

## 2019-11-09 DIAGNOSIS — Z7902 Long term (current) use of antithrombotics/antiplatelets: Secondary | ICD-10-CM | POA: Diagnosis not present

## 2019-11-09 DIAGNOSIS — M170 Bilateral primary osteoarthritis of knee: Secondary | ICD-10-CM | POA: Diagnosis not present

## 2019-11-09 DIAGNOSIS — I6502 Occlusion and stenosis of left vertebral artery: Secondary | ICD-10-CM | POA: Diagnosis not present

## 2019-11-09 DIAGNOSIS — I2510 Atherosclerotic heart disease of native coronary artery without angina pectoris: Secondary | ICD-10-CM | POA: Diagnosis not present

## 2019-11-09 DIAGNOSIS — Z7982 Long term (current) use of aspirin: Secondary | ICD-10-CM | POA: Diagnosis not present

## 2019-11-09 DIAGNOSIS — E039 Hypothyroidism, unspecified: Secondary | ICD-10-CM | POA: Diagnosis not present

## 2019-11-09 DIAGNOSIS — N183 Chronic kidney disease, stage 3 unspecified: Secondary | ICD-10-CM | POA: Diagnosis not present

## 2019-11-09 DIAGNOSIS — I97638 Postprocedural hematoma of a circulatory system organ or structure following other circulatory system procedure: Secondary | ICD-10-CM | POA: Diagnosis not present

## 2019-11-09 DIAGNOSIS — E1122 Type 2 diabetes mellitus with diabetic chronic kidney disease: Secondary | ICD-10-CM | POA: Diagnosis not present

## 2019-11-09 DIAGNOSIS — I150 Renovascular hypertension: Secondary | ICD-10-CM | POA: Diagnosis not present

## 2019-11-09 DIAGNOSIS — E1151 Type 2 diabetes mellitus with diabetic peripheral angiopathy without gangrene: Secondary | ICD-10-CM | POA: Diagnosis not present

## 2019-11-09 DIAGNOSIS — R2681 Unsteadiness on feet: Secondary | ICD-10-CM | POA: Diagnosis not present

## 2019-11-09 DIAGNOSIS — E785 Hyperlipidemia, unspecified: Secondary | ICD-10-CM | POA: Diagnosis not present

## 2019-11-09 DIAGNOSIS — Z951 Presence of aortocoronary bypass graft: Secondary | ICD-10-CM | POA: Diagnosis not present

## 2019-11-13 DIAGNOSIS — I6523 Occlusion and stenosis of bilateral carotid arteries: Secondary | ICD-10-CM | POA: Diagnosis not present

## 2019-11-14 DIAGNOSIS — R2681 Unsteadiness on feet: Secondary | ICD-10-CM | POA: Diagnosis not present

## 2019-11-14 DIAGNOSIS — E1122 Type 2 diabetes mellitus with diabetic chronic kidney disease: Secondary | ICD-10-CM | POA: Diagnosis not present

## 2019-11-14 DIAGNOSIS — E785 Hyperlipidemia, unspecified: Secondary | ICD-10-CM | POA: Diagnosis not present

## 2019-11-14 DIAGNOSIS — I150 Renovascular hypertension: Secondary | ICD-10-CM | POA: Diagnosis not present

## 2019-11-14 DIAGNOSIS — I2510 Atherosclerotic heart disease of native coronary artery without angina pectoris: Secondary | ICD-10-CM | POA: Diagnosis not present

## 2019-11-14 DIAGNOSIS — I6521 Occlusion and stenosis of right carotid artery: Secondary | ICD-10-CM | POA: Diagnosis not present

## 2019-11-14 DIAGNOSIS — Z7982 Long term (current) use of aspirin: Secondary | ICD-10-CM | POA: Diagnosis not present

## 2019-11-14 DIAGNOSIS — E1151 Type 2 diabetes mellitus with diabetic peripheral angiopathy without gangrene: Secondary | ICD-10-CM | POA: Diagnosis not present

## 2019-11-14 DIAGNOSIS — N183 Chronic kidney disease, stage 3 unspecified: Secondary | ICD-10-CM | POA: Diagnosis not present

## 2019-11-14 DIAGNOSIS — I6502 Occlusion and stenosis of left vertebral artery: Secondary | ICD-10-CM | POA: Diagnosis not present

## 2019-11-14 DIAGNOSIS — E039 Hypothyroidism, unspecified: Secondary | ICD-10-CM | POA: Diagnosis not present

## 2019-11-14 DIAGNOSIS — I97638 Postprocedural hematoma of a circulatory system organ or structure following other circulatory system procedure: Secondary | ICD-10-CM | POA: Diagnosis not present

## 2019-11-14 DIAGNOSIS — D352 Benign neoplasm of pituitary gland: Secondary | ICD-10-CM | POA: Diagnosis not present

## 2019-11-14 DIAGNOSIS — Z7902 Long term (current) use of antithrombotics/antiplatelets: Secondary | ICD-10-CM | POA: Diagnosis not present

## 2019-11-14 DIAGNOSIS — Z951 Presence of aortocoronary bypass graft: Secondary | ICD-10-CM | POA: Diagnosis not present

## 2019-11-14 DIAGNOSIS — I69398 Other sequelae of cerebral infarction: Secondary | ICD-10-CM | POA: Diagnosis not present

## 2019-11-14 DIAGNOSIS — M170 Bilateral primary osteoarthritis of knee: Secondary | ICD-10-CM | POA: Diagnosis not present

## 2019-11-17 DIAGNOSIS — E039 Hypothyroidism, unspecified: Secondary | ICD-10-CM | POA: Diagnosis not present

## 2019-11-17 DIAGNOSIS — Z7902 Long term (current) use of antithrombotics/antiplatelets: Secondary | ICD-10-CM | POA: Diagnosis not present

## 2019-11-17 DIAGNOSIS — E1151 Type 2 diabetes mellitus with diabetic peripheral angiopathy without gangrene: Secondary | ICD-10-CM | POA: Diagnosis not present

## 2019-11-17 DIAGNOSIS — I2510 Atherosclerotic heart disease of native coronary artery without angina pectoris: Secondary | ICD-10-CM | POA: Diagnosis not present

## 2019-11-17 DIAGNOSIS — E1122 Type 2 diabetes mellitus with diabetic chronic kidney disease: Secondary | ICD-10-CM | POA: Diagnosis not present

## 2019-11-17 DIAGNOSIS — I6502 Occlusion and stenosis of left vertebral artery: Secondary | ICD-10-CM | POA: Diagnosis not present

## 2019-11-17 DIAGNOSIS — I6521 Occlusion and stenosis of right carotid artery: Secondary | ICD-10-CM | POA: Diagnosis not present

## 2019-11-17 DIAGNOSIS — R2681 Unsteadiness on feet: Secondary | ICD-10-CM | POA: Diagnosis not present

## 2019-11-17 DIAGNOSIS — I150 Renovascular hypertension: Secondary | ICD-10-CM | POA: Diagnosis not present

## 2019-11-17 DIAGNOSIS — M170 Bilateral primary osteoarthritis of knee: Secondary | ICD-10-CM | POA: Diagnosis not present

## 2019-11-17 DIAGNOSIS — I69398 Other sequelae of cerebral infarction: Secondary | ICD-10-CM | POA: Diagnosis not present

## 2019-11-17 DIAGNOSIS — I97638 Postprocedural hematoma of a circulatory system organ or structure following other circulatory system procedure: Secondary | ICD-10-CM | POA: Diagnosis not present

## 2019-11-17 DIAGNOSIS — Z7982 Long term (current) use of aspirin: Secondary | ICD-10-CM | POA: Diagnosis not present

## 2019-11-17 DIAGNOSIS — N183 Chronic kidney disease, stage 3 unspecified: Secondary | ICD-10-CM | POA: Diagnosis not present

## 2019-11-17 DIAGNOSIS — Z951 Presence of aortocoronary bypass graft: Secondary | ICD-10-CM | POA: Diagnosis not present

## 2019-11-17 DIAGNOSIS — D352 Benign neoplasm of pituitary gland: Secondary | ICD-10-CM | POA: Diagnosis not present

## 2019-11-17 DIAGNOSIS — E785 Hyperlipidemia, unspecified: Secondary | ICD-10-CM | POA: Diagnosis not present

## 2019-11-18 DIAGNOSIS — E785 Hyperlipidemia, unspecified: Secondary | ICD-10-CM | POA: Diagnosis not present

## 2019-11-18 DIAGNOSIS — I69398 Other sequelae of cerebral infarction: Secondary | ICD-10-CM | POA: Diagnosis not present

## 2019-11-18 DIAGNOSIS — M170 Bilateral primary osteoarthritis of knee: Secondary | ICD-10-CM | POA: Diagnosis not present

## 2019-11-18 DIAGNOSIS — Z7982 Long term (current) use of aspirin: Secondary | ICD-10-CM | POA: Diagnosis not present

## 2019-11-18 DIAGNOSIS — I2510 Atherosclerotic heart disease of native coronary artery without angina pectoris: Secondary | ICD-10-CM | POA: Diagnosis not present

## 2019-11-18 DIAGNOSIS — E1122 Type 2 diabetes mellitus with diabetic chronic kidney disease: Secondary | ICD-10-CM | POA: Diagnosis not present

## 2019-11-18 DIAGNOSIS — I97638 Postprocedural hematoma of a circulatory system organ or structure following other circulatory system procedure: Secondary | ICD-10-CM | POA: Diagnosis not present

## 2019-11-18 DIAGNOSIS — I6521 Occlusion and stenosis of right carotid artery: Secondary | ICD-10-CM | POA: Diagnosis not present

## 2019-11-18 DIAGNOSIS — E039 Hypothyroidism, unspecified: Secondary | ICD-10-CM | POA: Diagnosis not present

## 2019-11-18 DIAGNOSIS — D352 Benign neoplasm of pituitary gland: Secondary | ICD-10-CM | POA: Diagnosis not present

## 2019-11-18 DIAGNOSIS — Z951 Presence of aortocoronary bypass graft: Secondary | ICD-10-CM | POA: Diagnosis not present

## 2019-11-18 DIAGNOSIS — N183 Chronic kidney disease, stage 3 unspecified: Secondary | ICD-10-CM | POA: Diagnosis not present

## 2019-11-18 DIAGNOSIS — I6502 Occlusion and stenosis of left vertebral artery: Secondary | ICD-10-CM | POA: Diagnosis not present

## 2019-11-18 DIAGNOSIS — I150 Renovascular hypertension: Secondary | ICD-10-CM | POA: Diagnosis not present

## 2019-11-18 DIAGNOSIS — R2681 Unsteadiness on feet: Secondary | ICD-10-CM | POA: Diagnosis not present

## 2019-11-18 DIAGNOSIS — E1151 Type 2 diabetes mellitus with diabetic peripheral angiopathy without gangrene: Secondary | ICD-10-CM | POA: Diagnosis not present

## 2019-11-18 DIAGNOSIS — Z7902 Long term (current) use of antithrombotics/antiplatelets: Secondary | ICD-10-CM | POA: Diagnosis not present

## 2019-11-20 DIAGNOSIS — I6502 Occlusion and stenosis of left vertebral artery: Secondary | ICD-10-CM | POA: Diagnosis not present

## 2019-11-20 DIAGNOSIS — N183 Chronic kidney disease, stage 3 unspecified: Secondary | ICD-10-CM | POA: Diagnosis not present

## 2019-11-20 DIAGNOSIS — E785 Hyperlipidemia, unspecified: Secondary | ICD-10-CM | POA: Diagnosis not present

## 2019-11-20 DIAGNOSIS — I6521 Occlusion and stenosis of right carotid artery: Secondary | ICD-10-CM | POA: Diagnosis not present

## 2019-11-20 DIAGNOSIS — E1122 Type 2 diabetes mellitus with diabetic chronic kidney disease: Secondary | ICD-10-CM | POA: Diagnosis not present

## 2019-11-20 DIAGNOSIS — I2510 Atherosclerotic heart disease of native coronary artery without angina pectoris: Secondary | ICD-10-CM | POA: Diagnosis not present

## 2019-11-20 DIAGNOSIS — I69398 Other sequelae of cerebral infarction: Secondary | ICD-10-CM | POA: Diagnosis not present

## 2019-11-20 DIAGNOSIS — Z7902 Long term (current) use of antithrombotics/antiplatelets: Secondary | ICD-10-CM | POA: Diagnosis not present

## 2019-11-20 DIAGNOSIS — I97638 Postprocedural hematoma of a circulatory system organ or structure following other circulatory system procedure: Secondary | ICD-10-CM | POA: Diagnosis not present

## 2019-11-20 DIAGNOSIS — M170 Bilateral primary osteoarthritis of knee: Secondary | ICD-10-CM | POA: Diagnosis not present

## 2019-11-20 DIAGNOSIS — R2681 Unsteadiness on feet: Secondary | ICD-10-CM | POA: Diagnosis not present

## 2019-11-20 DIAGNOSIS — Z951 Presence of aortocoronary bypass graft: Secondary | ICD-10-CM | POA: Diagnosis not present

## 2019-11-20 DIAGNOSIS — E1151 Type 2 diabetes mellitus with diabetic peripheral angiopathy without gangrene: Secondary | ICD-10-CM | POA: Diagnosis not present

## 2019-11-20 DIAGNOSIS — D352 Benign neoplasm of pituitary gland: Secondary | ICD-10-CM | POA: Diagnosis not present

## 2019-11-20 DIAGNOSIS — Z7982 Long term (current) use of aspirin: Secondary | ICD-10-CM | POA: Diagnosis not present

## 2019-11-20 DIAGNOSIS — I150 Renovascular hypertension: Secondary | ICD-10-CM | POA: Diagnosis not present

## 2019-11-20 DIAGNOSIS — E039 Hypothyroidism, unspecified: Secondary | ICD-10-CM | POA: Diagnosis not present

## 2019-11-21 DIAGNOSIS — N183 Chronic kidney disease, stage 3 unspecified: Secondary | ICD-10-CM | POA: Diagnosis not present

## 2019-11-21 DIAGNOSIS — Z951 Presence of aortocoronary bypass graft: Secondary | ICD-10-CM | POA: Diagnosis not present

## 2019-11-21 DIAGNOSIS — E785 Hyperlipidemia, unspecified: Secondary | ICD-10-CM | POA: Diagnosis not present

## 2019-11-21 DIAGNOSIS — I97638 Postprocedural hematoma of a circulatory system organ or structure following other circulatory system procedure: Secondary | ICD-10-CM | POA: Diagnosis not present

## 2019-11-21 DIAGNOSIS — I6502 Occlusion and stenosis of left vertebral artery: Secondary | ICD-10-CM | POA: Diagnosis not present

## 2019-11-21 DIAGNOSIS — R2681 Unsteadiness on feet: Secondary | ICD-10-CM | POA: Diagnosis not present

## 2019-11-21 DIAGNOSIS — Z7902 Long term (current) use of antithrombotics/antiplatelets: Secondary | ICD-10-CM | POA: Diagnosis not present

## 2019-11-21 DIAGNOSIS — I2510 Atherosclerotic heart disease of native coronary artery without angina pectoris: Secondary | ICD-10-CM | POA: Diagnosis not present

## 2019-11-21 DIAGNOSIS — I150 Renovascular hypertension: Secondary | ICD-10-CM | POA: Diagnosis not present

## 2019-11-21 DIAGNOSIS — M170 Bilateral primary osteoarthritis of knee: Secondary | ICD-10-CM | POA: Diagnosis not present

## 2019-11-21 DIAGNOSIS — I69398 Other sequelae of cerebral infarction: Secondary | ICD-10-CM | POA: Diagnosis not present

## 2019-11-21 DIAGNOSIS — E039 Hypothyroidism, unspecified: Secondary | ICD-10-CM | POA: Diagnosis not present

## 2019-11-21 DIAGNOSIS — D352 Benign neoplasm of pituitary gland: Secondary | ICD-10-CM | POA: Diagnosis not present

## 2019-11-21 DIAGNOSIS — I6521 Occlusion and stenosis of right carotid artery: Secondary | ICD-10-CM | POA: Diagnosis not present

## 2019-11-21 DIAGNOSIS — E1151 Type 2 diabetes mellitus with diabetic peripheral angiopathy without gangrene: Secondary | ICD-10-CM | POA: Diagnosis not present

## 2019-11-21 DIAGNOSIS — E1122 Type 2 diabetes mellitus with diabetic chronic kidney disease: Secondary | ICD-10-CM | POA: Diagnosis not present

## 2019-11-21 DIAGNOSIS — Z7982 Long term (current) use of aspirin: Secondary | ICD-10-CM | POA: Diagnosis not present

## 2019-11-24 DIAGNOSIS — E1122 Type 2 diabetes mellitus with diabetic chronic kidney disease: Secondary | ICD-10-CM | POA: Diagnosis not present

## 2019-11-24 DIAGNOSIS — Z7902 Long term (current) use of antithrombotics/antiplatelets: Secondary | ICD-10-CM | POA: Diagnosis not present

## 2019-11-24 DIAGNOSIS — R2681 Unsteadiness on feet: Secondary | ICD-10-CM | POA: Diagnosis not present

## 2019-11-24 DIAGNOSIS — I69398 Other sequelae of cerebral infarction: Secondary | ICD-10-CM | POA: Diagnosis not present

## 2019-11-24 DIAGNOSIS — E785 Hyperlipidemia, unspecified: Secondary | ICD-10-CM | POA: Diagnosis not present

## 2019-11-24 DIAGNOSIS — N183 Chronic kidney disease, stage 3 unspecified: Secondary | ICD-10-CM | POA: Diagnosis not present

## 2019-11-24 DIAGNOSIS — I6521 Occlusion and stenosis of right carotid artery: Secondary | ICD-10-CM | POA: Diagnosis not present

## 2019-11-24 DIAGNOSIS — I2510 Atherosclerotic heart disease of native coronary artery without angina pectoris: Secondary | ICD-10-CM | POA: Diagnosis not present

## 2019-11-24 DIAGNOSIS — E039 Hypothyroidism, unspecified: Secondary | ICD-10-CM | POA: Diagnosis not present

## 2019-11-24 DIAGNOSIS — M170 Bilateral primary osteoarthritis of knee: Secondary | ICD-10-CM | POA: Diagnosis not present

## 2019-11-24 DIAGNOSIS — E1151 Type 2 diabetes mellitus with diabetic peripheral angiopathy without gangrene: Secondary | ICD-10-CM | POA: Diagnosis not present

## 2019-11-24 DIAGNOSIS — I97638 Postprocedural hematoma of a circulatory system organ or structure following other circulatory system procedure: Secondary | ICD-10-CM | POA: Diagnosis not present

## 2019-11-24 DIAGNOSIS — I150 Renovascular hypertension: Secondary | ICD-10-CM | POA: Diagnosis not present

## 2019-11-24 DIAGNOSIS — D352 Benign neoplasm of pituitary gland: Secondary | ICD-10-CM | POA: Diagnosis not present

## 2019-11-24 DIAGNOSIS — I6502 Occlusion and stenosis of left vertebral artery: Secondary | ICD-10-CM | POA: Diagnosis not present

## 2019-11-24 DIAGNOSIS — Z7982 Long term (current) use of aspirin: Secondary | ICD-10-CM | POA: Diagnosis not present

## 2019-11-24 DIAGNOSIS — Z951 Presence of aortocoronary bypass graft: Secondary | ICD-10-CM | POA: Diagnosis not present

## 2019-11-25 DIAGNOSIS — Z7982 Long term (current) use of aspirin: Secondary | ICD-10-CM | POA: Diagnosis not present

## 2019-11-25 DIAGNOSIS — R2681 Unsteadiness on feet: Secondary | ICD-10-CM | POA: Diagnosis not present

## 2019-11-25 DIAGNOSIS — E785 Hyperlipidemia, unspecified: Secondary | ICD-10-CM | POA: Diagnosis not present

## 2019-11-25 DIAGNOSIS — N183 Chronic kidney disease, stage 3 unspecified: Secondary | ICD-10-CM | POA: Diagnosis not present

## 2019-11-25 DIAGNOSIS — M170 Bilateral primary osteoarthritis of knee: Secondary | ICD-10-CM | POA: Diagnosis not present

## 2019-11-25 DIAGNOSIS — Z7902 Long term (current) use of antithrombotics/antiplatelets: Secondary | ICD-10-CM | POA: Diagnosis not present

## 2019-11-25 DIAGNOSIS — I2510 Atherosclerotic heart disease of native coronary artery without angina pectoris: Secondary | ICD-10-CM | POA: Diagnosis not present

## 2019-11-25 DIAGNOSIS — E1151 Type 2 diabetes mellitus with diabetic peripheral angiopathy without gangrene: Secondary | ICD-10-CM | POA: Diagnosis not present

## 2019-11-25 DIAGNOSIS — I150 Renovascular hypertension: Secondary | ICD-10-CM | POA: Diagnosis not present

## 2019-11-25 DIAGNOSIS — D352 Benign neoplasm of pituitary gland: Secondary | ICD-10-CM | POA: Diagnosis not present

## 2019-11-25 DIAGNOSIS — E039 Hypothyroidism, unspecified: Secondary | ICD-10-CM | POA: Diagnosis not present

## 2019-11-25 DIAGNOSIS — I97638 Postprocedural hematoma of a circulatory system organ or structure following other circulatory system procedure: Secondary | ICD-10-CM | POA: Diagnosis not present

## 2019-11-25 DIAGNOSIS — E1122 Type 2 diabetes mellitus with diabetic chronic kidney disease: Secondary | ICD-10-CM | POA: Diagnosis not present

## 2019-11-25 DIAGNOSIS — I69398 Other sequelae of cerebral infarction: Secondary | ICD-10-CM | POA: Diagnosis not present

## 2019-11-25 DIAGNOSIS — Z951 Presence of aortocoronary bypass graft: Secondary | ICD-10-CM | POA: Diagnosis not present

## 2019-11-25 DIAGNOSIS — I6502 Occlusion and stenosis of left vertebral artery: Secondary | ICD-10-CM | POA: Diagnosis not present

## 2019-11-25 DIAGNOSIS — I6521 Occlusion and stenosis of right carotid artery: Secondary | ICD-10-CM | POA: Diagnosis not present

## 2019-11-26 DIAGNOSIS — R2681 Unsteadiness on feet: Secondary | ICD-10-CM | POA: Diagnosis not present

## 2019-11-26 DIAGNOSIS — Z951 Presence of aortocoronary bypass graft: Secondary | ICD-10-CM | POA: Diagnosis not present

## 2019-11-26 DIAGNOSIS — D352 Benign neoplasm of pituitary gland: Secondary | ICD-10-CM | POA: Diagnosis not present

## 2019-11-26 DIAGNOSIS — Z7902 Long term (current) use of antithrombotics/antiplatelets: Secondary | ICD-10-CM | POA: Diagnosis not present

## 2019-11-26 DIAGNOSIS — I6521 Occlusion and stenosis of right carotid artery: Secondary | ICD-10-CM | POA: Diagnosis not present

## 2019-11-26 DIAGNOSIS — Z7982 Long term (current) use of aspirin: Secondary | ICD-10-CM | POA: Diagnosis not present

## 2019-11-26 DIAGNOSIS — I2510 Atherosclerotic heart disease of native coronary artery without angina pectoris: Secondary | ICD-10-CM | POA: Diagnosis not present

## 2019-11-26 DIAGNOSIS — E1151 Type 2 diabetes mellitus with diabetic peripheral angiopathy without gangrene: Secondary | ICD-10-CM | POA: Diagnosis not present

## 2019-11-26 DIAGNOSIS — I150 Renovascular hypertension: Secondary | ICD-10-CM | POA: Diagnosis not present

## 2019-11-26 DIAGNOSIS — I69398 Other sequelae of cerebral infarction: Secondary | ICD-10-CM | POA: Diagnosis not present

## 2019-11-26 DIAGNOSIS — N183 Chronic kidney disease, stage 3 unspecified: Secondary | ICD-10-CM | POA: Diagnosis not present

## 2019-11-26 DIAGNOSIS — E1122 Type 2 diabetes mellitus with diabetic chronic kidney disease: Secondary | ICD-10-CM | POA: Diagnosis not present

## 2019-11-26 DIAGNOSIS — M170 Bilateral primary osteoarthritis of knee: Secondary | ICD-10-CM | POA: Diagnosis not present

## 2019-11-26 DIAGNOSIS — E039 Hypothyroidism, unspecified: Secondary | ICD-10-CM | POA: Diagnosis not present

## 2019-11-26 DIAGNOSIS — I6502 Occlusion and stenosis of left vertebral artery: Secondary | ICD-10-CM | POA: Diagnosis not present

## 2019-11-26 DIAGNOSIS — E785 Hyperlipidemia, unspecified: Secondary | ICD-10-CM | POA: Diagnosis not present

## 2019-11-26 DIAGNOSIS — I97638 Postprocedural hematoma of a circulatory system organ or structure following other circulatory system procedure: Secondary | ICD-10-CM | POA: Diagnosis not present

## 2019-11-27 DIAGNOSIS — E1151 Type 2 diabetes mellitus with diabetic peripheral angiopathy without gangrene: Secondary | ICD-10-CM | POA: Diagnosis not present

## 2019-11-27 DIAGNOSIS — E039 Hypothyroidism, unspecified: Secondary | ICD-10-CM | POA: Diagnosis not present

## 2019-11-27 DIAGNOSIS — I2510 Atherosclerotic heart disease of native coronary artery without angina pectoris: Secondary | ICD-10-CM | POA: Diagnosis not present

## 2019-11-27 DIAGNOSIS — E1122 Type 2 diabetes mellitus with diabetic chronic kidney disease: Secondary | ICD-10-CM | POA: Diagnosis not present

## 2019-11-27 DIAGNOSIS — M170 Bilateral primary osteoarthritis of knee: Secondary | ICD-10-CM | POA: Diagnosis not present

## 2019-11-27 DIAGNOSIS — I69398 Other sequelae of cerebral infarction: Secondary | ICD-10-CM | POA: Diagnosis not present

## 2019-11-27 DIAGNOSIS — I6502 Occlusion and stenosis of left vertebral artery: Secondary | ICD-10-CM | POA: Diagnosis not present

## 2019-11-27 DIAGNOSIS — I6521 Occlusion and stenosis of right carotid artery: Secondary | ICD-10-CM | POA: Diagnosis not present

## 2019-11-27 DIAGNOSIS — I150 Renovascular hypertension: Secondary | ICD-10-CM | POA: Diagnosis not present

## 2019-11-27 DIAGNOSIS — Z7902 Long term (current) use of antithrombotics/antiplatelets: Secondary | ICD-10-CM | POA: Diagnosis not present

## 2019-11-27 DIAGNOSIS — R2681 Unsteadiness on feet: Secondary | ICD-10-CM | POA: Diagnosis not present

## 2019-11-27 DIAGNOSIS — I97638 Postprocedural hematoma of a circulatory system organ or structure following other circulatory system procedure: Secondary | ICD-10-CM | POA: Diagnosis not present

## 2019-11-27 DIAGNOSIS — Z951 Presence of aortocoronary bypass graft: Secondary | ICD-10-CM | POA: Diagnosis not present

## 2019-11-27 DIAGNOSIS — Z7982 Long term (current) use of aspirin: Secondary | ICD-10-CM | POA: Diagnosis not present

## 2019-11-27 DIAGNOSIS — E785 Hyperlipidemia, unspecified: Secondary | ICD-10-CM | POA: Diagnosis not present

## 2019-11-27 DIAGNOSIS — N183 Chronic kidney disease, stage 3 unspecified: Secondary | ICD-10-CM | POA: Diagnosis not present

## 2019-11-27 DIAGNOSIS — D352 Benign neoplasm of pituitary gland: Secondary | ICD-10-CM | POA: Diagnosis not present

## 2019-12-01 DIAGNOSIS — Z7982 Long term (current) use of aspirin: Secondary | ICD-10-CM | POA: Diagnosis not present

## 2019-12-01 DIAGNOSIS — I2510 Atherosclerotic heart disease of native coronary artery without angina pectoris: Secondary | ICD-10-CM | POA: Diagnosis not present

## 2019-12-01 DIAGNOSIS — E039 Hypothyroidism, unspecified: Secondary | ICD-10-CM | POA: Diagnosis not present

## 2019-12-01 DIAGNOSIS — E1151 Type 2 diabetes mellitus with diabetic peripheral angiopathy without gangrene: Secondary | ICD-10-CM | POA: Diagnosis not present

## 2019-12-01 DIAGNOSIS — N183 Chronic kidney disease, stage 3 unspecified: Secondary | ICD-10-CM | POA: Diagnosis not present

## 2019-12-01 DIAGNOSIS — I97638 Postprocedural hematoma of a circulatory system organ or structure following other circulatory system procedure: Secondary | ICD-10-CM | POA: Diagnosis not present

## 2019-12-01 DIAGNOSIS — I69398 Other sequelae of cerebral infarction: Secondary | ICD-10-CM | POA: Diagnosis not present

## 2019-12-01 DIAGNOSIS — Z7902 Long term (current) use of antithrombotics/antiplatelets: Secondary | ICD-10-CM | POA: Diagnosis not present

## 2019-12-01 DIAGNOSIS — I150 Renovascular hypertension: Secondary | ICD-10-CM | POA: Diagnosis not present

## 2019-12-01 DIAGNOSIS — R2681 Unsteadiness on feet: Secondary | ICD-10-CM | POA: Diagnosis not present

## 2019-12-01 DIAGNOSIS — E1122 Type 2 diabetes mellitus with diabetic chronic kidney disease: Secondary | ICD-10-CM | POA: Diagnosis not present

## 2019-12-01 DIAGNOSIS — D352 Benign neoplasm of pituitary gland: Secondary | ICD-10-CM | POA: Diagnosis not present

## 2019-12-01 DIAGNOSIS — M170 Bilateral primary osteoarthritis of knee: Secondary | ICD-10-CM | POA: Diagnosis not present

## 2019-12-01 DIAGNOSIS — I6502 Occlusion and stenosis of left vertebral artery: Secondary | ICD-10-CM | POA: Diagnosis not present

## 2019-12-01 DIAGNOSIS — Z951 Presence of aortocoronary bypass graft: Secondary | ICD-10-CM | POA: Diagnosis not present

## 2019-12-01 DIAGNOSIS — I6521 Occlusion and stenosis of right carotid artery: Secondary | ICD-10-CM | POA: Diagnosis not present

## 2019-12-01 DIAGNOSIS — E785 Hyperlipidemia, unspecified: Secondary | ICD-10-CM | POA: Diagnosis not present

## 2019-12-03 DIAGNOSIS — M170 Bilateral primary osteoarthritis of knee: Secondary | ICD-10-CM | POA: Diagnosis not present

## 2019-12-03 DIAGNOSIS — I6521 Occlusion and stenosis of right carotid artery: Secondary | ICD-10-CM | POA: Diagnosis not present

## 2019-12-03 DIAGNOSIS — Z7982 Long term (current) use of aspirin: Secondary | ICD-10-CM | POA: Diagnosis not present

## 2019-12-03 DIAGNOSIS — Z7902 Long term (current) use of antithrombotics/antiplatelets: Secondary | ICD-10-CM | POA: Diagnosis not present

## 2019-12-03 DIAGNOSIS — E785 Hyperlipidemia, unspecified: Secondary | ICD-10-CM | POA: Diagnosis not present

## 2019-12-03 DIAGNOSIS — D352 Benign neoplasm of pituitary gland: Secondary | ICD-10-CM | POA: Diagnosis not present

## 2019-12-03 DIAGNOSIS — E1151 Type 2 diabetes mellitus with diabetic peripheral angiopathy without gangrene: Secondary | ICD-10-CM | POA: Diagnosis not present

## 2019-12-03 DIAGNOSIS — R2681 Unsteadiness on feet: Secondary | ICD-10-CM | POA: Diagnosis not present

## 2019-12-03 DIAGNOSIS — I6502 Occlusion and stenosis of left vertebral artery: Secondary | ICD-10-CM | POA: Diagnosis not present

## 2019-12-03 DIAGNOSIS — E1122 Type 2 diabetes mellitus with diabetic chronic kidney disease: Secondary | ICD-10-CM | POA: Diagnosis not present

## 2019-12-03 DIAGNOSIS — I150 Renovascular hypertension: Secondary | ICD-10-CM | POA: Diagnosis not present

## 2019-12-03 DIAGNOSIS — N183 Chronic kidney disease, stage 3 unspecified: Secondary | ICD-10-CM | POA: Diagnosis not present

## 2019-12-03 DIAGNOSIS — I97638 Postprocedural hematoma of a circulatory system organ or structure following other circulatory system procedure: Secondary | ICD-10-CM | POA: Diagnosis not present

## 2019-12-03 DIAGNOSIS — E039 Hypothyroidism, unspecified: Secondary | ICD-10-CM | POA: Diagnosis not present

## 2019-12-03 DIAGNOSIS — I69398 Other sequelae of cerebral infarction: Secondary | ICD-10-CM | POA: Diagnosis not present

## 2019-12-03 DIAGNOSIS — I2510 Atherosclerotic heart disease of native coronary artery without angina pectoris: Secondary | ICD-10-CM | POA: Diagnosis not present

## 2019-12-03 DIAGNOSIS — Z951 Presence of aortocoronary bypass graft: Secondary | ICD-10-CM | POA: Diagnosis not present

## 2019-12-04 DIAGNOSIS — I69398 Other sequelae of cerebral infarction: Secondary | ICD-10-CM | POA: Diagnosis not present

## 2019-12-04 DIAGNOSIS — D352 Benign neoplasm of pituitary gland: Secondary | ICD-10-CM | POA: Diagnosis not present

## 2019-12-04 DIAGNOSIS — I150 Renovascular hypertension: Secondary | ICD-10-CM | POA: Diagnosis not present

## 2019-12-04 DIAGNOSIS — E1122 Type 2 diabetes mellitus with diabetic chronic kidney disease: Secondary | ICD-10-CM | POA: Diagnosis not present

## 2019-12-04 DIAGNOSIS — E1151 Type 2 diabetes mellitus with diabetic peripheral angiopathy without gangrene: Secondary | ICD-10-CM | POA: Diagnosis not present

## 2019-12-04 DIAGNOSIS — R2681 Unsteadiness on feet: Secondary | ICD-10-CM | POA: Diagnosis not present

## 2019-12-04 DIAGNOSIS — M170 Bilateral primary osteoarthritis of knee: Secondary | ICD-10-CM | POA: Diagnosis not present

## 2019-12-04 DIAGNOSIS — Z7982 Long term (current) use of aspirin: Secondary | ICD-10-CM | POA: Diagnosis not present

## 2019-12-04 DIAGNOSIS — I6502 Occlusion and stenosis of left vertebral artery: Secondary | ICD-10-CM | POA: Diagnosis not present

## 2019-12-04 DIAGNOSIS — Z7902 Long term (current) use of antithrombotics/antiplatelets: Secondary | ICD-10-CM | POA: Diagnosis not present

## 2019-12-04 DIAGNOSIS — N183 Chronic kidney disease, stage 3 unspecified: Secondary | ICD-10-CM | POA: Diagnosis not present

## 2019-12-04 DIAGNOSIS — E039 Hypothyroidism, unspecified: Secondary | ICD-10-CM | POA: Diagnosis not present

## 2019-12-04 DIAGNOSIS — I6521 Occlusion and stenosis of right carotid artery: Secondary | ICD-10-CM | POA: Diagnosis not present

## 2019-12-04 DIAGNOSIS — Z951 Presence of aortocoronary bypass graft: Secondary | ICD-10-CM | POA: Diagnosis not present

## 2019-12-04 DIAGNOSIS — I97638 Postprocedural hematoma of a circulatory system organ or structure following other circulatory system procedure: Secondary | ICD-10-CM | POA: Diagnosis not present

## 2019-12-04 DIAGNOSIS — I2510 Atherosclerotic heart disease of native coronary artery without angina pectoris: Secondary | ICD-10-CM | POA: Diagnosis not present

## 2019-12-04 DIAGNOSIS — E785 Hyperlipidemia, unspecified: Secondary | ICD-10-CM | POA: Diagnosis not present

## 2019-12-07 DIAGNOSIS — E538 Deficiency of other specified B group vitamins: Secondary | ICD-10-CM | POA: Insufficient documentation

## 2019-12-10 DIAGNOSIS — I6502 Occlusion and stenosis of left vertebral artery: Secondary | ICD-10-CM | POA: Diagnosis not present

## 2019-12-10 DIAGNOSIS — Z951 Presence of aortocoronary bypass graft: Secondary | ICD-10-CM | POA: Diagnosis not present

## 2019-12-10 DIAGNOSIS — M170 Bilateral primary osteoarthritis of knee: Secondary | ICD-10-CM | POA: Diagnosis not present

## 2019-12-10 DIAGNOSIS — Z7982 Long term (current) use of aspirin: Secondary | ICD-10-CM | POA: Diagnosis not present

## 2019-12-10 DIAGNOSIS — I97638 Postprocedural hematoma of a circulatory system organ or structure following other circulatory system procedure: Secondary | ICD-10-CM | POA: Diagnosis not present

## 2019-12-10 DIAGNOSIS — R2681 Unsteadiness on feet: Secondary | ICD-10-CM | POA: Diagnosis not present

## 2019-12-10 DIAGNOSIS — I69398 Other sequelae of cerebral infarction: Secondary | ICD-10-CM | POA: Diagnosis not present

## 2019-12-10 DIAGNOSIS — E1122 Type 2 diabetes mellitus with diabetic chronic kidney disease: Secondary | ICD-10-CM | POA: Diagnosis not present

## 2019-12-10 DIAGNOSIS — N183 Chronic kidney disease, stage 3 unspecified: Secondary | ICD-10-CM | POA: Diagnosis not present

## 2019-12-10 DIAGNOSIS — D352 Benign neoplasm of pituitary gland: Secondary | ICD-10-CM | POA: Diagnosis not present

## 2019-12-10 DIAGNOSIS — I6521 Occlusion and stenosis of right carotid artery: Secondary | ICD-10-CM | POA: Diagnosis not present

## 2019-12-10 DIAGNOSIS — E1151 Type 2 diabetes mellitus with diabetic peripheral angiopathy without gangrene: Secondary | ICD-10-CM | POA: Diagnosis not present

## 2019-12-10 DIAGNOSIS — E785 Hyperlipidemia, unspecified: Secondary | ICD-10-CM | POA: Diagnosis not present

## 2019-12-10 DIAGNOSIS — E039 Hypothyroidism, unspecified: Secondary | ICD-10-CM | POA: Diagnosis not present

## 2019-12-10 DIAGNOSIS — I2510 Atherosclerotic heart disease of native coronary artery without angina pectoris: Secondary | ICD-10-CM | POA: Diagnosis not present

## 2019-12-10 DIAGNOSIS — I150 Renovascular hypertension: Secondary | ICD-10-CM | POA: Diagnosis not present

## 2019-12-10 DIAGNOSIS — Z7902 Long term (current) use of antithrombotics/antiplatelets: Secondary | ICD-10-CM | POA: Diagnosis not present

## 2019-12-11 DIAGNOSIS — E785 Hyperlipidemia, unspecified: Secondary | ICD-10-CM | POA: Diagnosis not present

## 2019-12-11 DIAGNOSIS — Z7982 Long term (current) use of aspirin: Secondary | ICD-10-CM | POA: Diagnosis not present

## 2019-12-11 DIAGNOSIS — Z951 Presence of aortocoronary bypass graft: Secondary | ICD-10-CM | POA: Diagnosis not present

## 2019-12-11 DIAGNOSIS — E039 Hypothyroidism, unspecified: Secondary | ICD-10-CM | POA: Diagnosis not present

## 2019-12-11 DIAGNOSIS — I2510 Atherosclerotic heart disease of native coronary artery without angina pectoris: Secondary | ICD-10-CM | POA: Diagnosis not present

## 2019-12-11 DIAGNOSIS — I97638 Postprocedural hematoma of a circulatory system organ or structure following other circulatory system procedure: Secondary | ICD-10-CM | POA: Diagnosis not present

## 2019-12-11 DIAGNOSIS — D352 Benign neoplasm of pituitary gland: Secondary | ICD-10-CM | POA: Diagnosis not present

## 2019-12-11 DIAGNOSIS — R2681 Unsteadiness on feet: Secondary | ICD-10-CM | POA: Diagnosis not present

## 2019-12-11 DIAGNOSIS — I6502 Occlusion and stenosis of left vertebral artery: Secondary | ICD-10-CM | POA: Diagnosis not present

## 2019-12-11 DIAGNOSIS — Z7902 Long term (current) use of antithrombotics/antiplatelets: Secondary | ICD-10-CM | POA: Diagnosis not present

## 2019-12-11 DIAGNOSIS — I69398 Other sequelae of cerebral infarction: Secondary | ICD-10-CM | POA: Diagnosis not present

## 2019-12-11 DIAGNOSIS — I6521 Occlusion and stenosis of right carotid artery: Secondary | ICD-10-CM | POA: Diagnosis not present

## 2019-12-11 DIAGNOSIS — I150 Renovascular hypertension: Secondary | ICD-10-CM | POA: Diagnosis not present

## 2019-12-11 DIAGNOSIS — M170 Bilateral primary osteoarthritis of knee: Secondary | ICD-10-CM | POA: Diagnosis not present

## 2019-12-11 DIAGNOSIS — E1151 Type 2 diabetes mellitus with diabetic peripheral angiopathy without gangrene: Secondary | ICD-10-CM | POA: Diagnosis not present

## 2019-12-11 DIAGNOSIS — N183 Chronic kidney disease, stage 3 unspecified: Secondary | ICD-10-CM | POA: Diagnosis not present

## 2019-12-11 DIAGNOSIS — E1122 Type 2 diabetes mellitus with diabetic chronic kidney disease: Secondary | ICD-10-CM | POA: Diagnosis not present

## 2019-12-12 DIAGNOSIS — E559 Vitamin D deficiency, unspecified: Secondary | ICD-10-CM | POA: Diagnosis not present

## 2019-12-12 DIAGNOSIS — E039 Hypothyroidism, unspecified: Secondary | ICD-10-CM | POA: Diagnosis not present

## 2019-12-12 DIAGNOSIS — N1832 Chronic kidney disease, stage 3b: Secondary | ICD-10-CM | POA: Diagnosis not present

## 2019-12-12 DIAGNOSIS — E538 Deficiency of other specified B group vitamins: Secondary | ICD-10-CM | POA: Diagnosis not present

## 2019-12-12 DIAGNOSIS — I10 Essential (primary) hypertension: Secondary | ICD-10-CM | POA: Diagnosis not present

## 2019-12-12 DIAGNOSIS — E782 Mixed hyperlipidemia: Secondary | ICD-10-CM | POA: Diagnosis not present

## 2019-12-12 DIAGNOSIS — E1122 Type 2 diabetes mellitus with diabetic chronic kidney disease: Secondary | ICD-10-CM | POA: Diagnosis not present

## 2019-12-12 DIAGNOSIS — Z09 Encounter for follow-up examination after completed treatment for conditions other than malignant neoplasm: Secondary | ICD-10-CM | POA: Diagnosis not present

## 2019-12-12 DIAGNOSIS — I129 Hypertensive chronic kidney disease with stage 1 through stage 4 chronic kidney disease, or unspecified chronic kidney disease: Secondary | ICD-10-CM | POA: Diagnosis not present

## 2019-12-12 DIAGNOSIS — I639 Cerebral infarction, unspecified: Secondary | ICD-10-CM | POA: Diagnosis not present

## 2019-12-12 DIAGNOSIS — D72829 Elevated white blood cell count, unspecified: Secondary | ICD-10-CM | POA: Diagnosis not present

## 2019-12-17 DIAGNOSIS — I150 Renovascular hypertension: Secondary | ICD-10-CM | POA: Diagnosis not present

## 2019-12-17 DIAGNOSIS — Z951 Presence of aortocoronary bypass graft: Secondary | ICD-10-CM | POA: Diagnosis not present

## 2019-12-17 DIAGNOSIS — I69398 Other sequelae of cerebral infarction: Secondary | ICD-10-CM | POA: Diagnosis not present

## 2019-12-17 DIAGNOSIS — E785 Hyperlipidemia, unspecified: Secondary | ICD-10-CM | POA: Diagnosis not present

## 2019-12-17 DIAGNOSIS — D352 Benign neoplasm of pituitary gland: Secondary | ICD-10-CM | POA: Diagnosis not present

## 2019-12-17 DIAGNOSIS — Z7902 Long term (current) use of antithrombotics/antiplatelets: Secondary | ICD-10-CM | POA: Diagnosis not present

## 2019-12-17 DIAGNOSIS — E1122 Type 2 diabetes mellitus with diabetic chronic kidney disease: Secondary | ICD-10-CM | POA: Diagnosis not present

## 2019-12-17 DIAGNOSIS — I6502 Occlusion and stenosis of left vertebral artery: Secondary | ICD-10-CM | POA: Diagnosis not present

## 2019-12-17 DIAGNOSIS — M170 Bilateral primary osteoarthritis of knee: Secondary | ICD-10-CM | POA: Diagnosis not present

## 2019-12-17 DIAGNOSIS — E1151 Type 2 diabetes mellitus with diabetic peripheral angiopathy without gangrene: Secondary | ICD-10-CM | POA: Diagnosis not present

## 2019-12-17 DIAGNOSIS — R2681 Unsteadiness on feet: Secondary | ICD-10-CM | POA: Diagnosis not present

## 2019-12-17 DIAGNOSIS — I2510 Atherosclerotic heart disease of native coronary artery without angina pectoris: Secondary | ICD-10-CM | POA: Diagnosis not present

## 2019-12-17 DIAGNOSIS — I6521 Occlusion and stenosis of right carotid artery: Secondary | ICD-10-CM | POA: Diagnosis not present

## 2019-12-17 DIAGNOSIS — I97638 Postprocedural hematoma of a circulatory system organ or structure following other circulatory system procedure: Secondary | ICD-10-CM | POA: Diagnosis not present

## 2019-12-17 DIAGNOSIS — N183 Chronic kidney disease, stage 3 unspecified: Secondary | ICD-10-CM | POA: Diagnosis not present

## 2019-12-17 DIAGNOSIS — Z7982 Long term (current) use of aspirin: Secondary | ICD-10-CM | POA: Diagnosis not present

## 2019-12-17 DIAGNOSIS — E039 Hypothyroidism, unspecified: Secondary | ICD-10-CM | POA: Diagnosis not present

## 2019-12-19 ENCOUNTER — Ambulatory Visit: Payer: Medicare Other | Admitting: Cardiovascular Disease

## 2019-12-19 ENCOUNTER — Other Ambulatory Visit: Payer: Self-pay

## 2019-12-19 ENCOUNTER — Encounter: Payer: Self-pay | Admitting: Cardiovascular Disease

## 2019-12-19 VITALS — BP 134/80 | HR 53 | Ht 65.0 in | Wt 156.2 lb

## 2019-12-19 DIAGNOSIS — I10 Essential (primary) hypertension: Secondary | ICD-10-CM | POA: Diagnosis not present

## 2019-12-19 DIAGNOSIS — E782 Mixed hyperlipidemia: Secondary | ICD-10-CM

## 2019-12-19 DIAGNOSIS — I25700 Atherosclerosis of coronary artery bypass graft(s), unspecified, with unstable angina pectoris: Secondary | ICD-10-CM | POA: Diagnosis not present

## 2019-12-19 DIAGNOSIS — I6523 Occlusion and stenosis of bilateral carotid arteries: Secondary | ICD-10-CM | POA: Diagnosis not present

## 2019-12-19 NOTE — Patient Instructions (Signed)
Medication Instructions:  Your provider recommends that you continue on your current medications as directed. Please refer to the Current Medication list given to you today.   *If you need a refill on your cardiac medications before your next appointment, please call your pharmacy*   Follow-Up: You have been referred to the [facility].  At [facility], you and your health needs are our priority.  As part of our continuing mission to provide you with exceptional heart care, we have created designated Provider Care Teams.  These Care Teams include your primary Cardiologist (physician) and Advanced Practice Providers (APPs -  Physician Assistants and Nurse Practitioners) who all work together to provide you with the care you need, when you need it. Your next appointment:   12 month(s) The format for your next appointment:   In Person Provider:   You may see Sherren Mocha, MD or one of the following Advanced Practice Providers on your designated Care Team:    Richardson Dopp, PA-C  Vin [place], [place]

## 2019-12-19 NOTE — Progress Notes (Signed)
Cardiology Office Note:    Date:  12/19/2019   ID:  Amanda Curtis, DOB [birth year], MRN [id number]  PCP:  Jilda Panda, MD  [facility] Cardiologist:  Sherren Mocha, MD  [place] Electrophysiologist:  None   Referring MD: Jilda Panda, MD   Chief Complaint  Patient presents with  . Coronary Artery Disease    History of Present Illness:    Amanda Curtis is a 84 y.o. female with a hx of coronary and carotid stenosis, presenting for follow-up evaluation.  The patient underwent multivessel CABG in 2015.  Other medical problems have included hypertension, mixed hyperlipidemia, and bilateral carotid stenosis.  The patient presented with a left MCA infarct in 2018 and was noted to have moderate left internal carotid artery stenosis.  She was found at that time to have moderate to severe right internal carotid artery stenosis which was felt to be asymptomatic.  The patient was seen by vascular surgery and she declined endarterectomy.  She then presented in August of this year with expressive aphasia and fatigue, noted to have small subacute infarcts in the right frontoparietal lobes.  A CTA of the neck showed a 90% right internal carotid artery stenosis and a 60% left internal carotid artery stenosis.  She underwent right carotid endarterectomy October 16, 2019.  She developed a severe hematoma postoperatively and was taken emergently back to the operating room for evacuation of the hematoma.  She required skilled nursing for rehab.  The whole experience was very traumatic for her and she has a difficult time talking about it today.  The patient is here alone.  She has not had any recent problems with chest pain, chest pressure, or shortness of breath.  She is complains of a lot of fatigue since her hospitalization in August.  She denies orthopnea, PND, or recent heart palpitations.  She has had some swelling of the left leg.  No other complaints.  Past Medical History:  Diagnosis Date  .  Arthritis   . Diabetes mellitus without complication ([place])   . Graves disease 2000   s/p RIA  . Hypertension   . Insomnia   . Pituitary tumor   . Stroke ([place])   . Vitamin D deficiency     Past Surgical History:  Procedure Laterality Date  . APPENDECTOMY    . CORONARY ARTERY BYPASS GRAFT N/A 07/04/2013   Procedure: CORONARY ARTERY BYPASS GRAFTING (CABG) times four using left internal mammary artery and right saphenous leg vein.;  Surgeon: Gaye Pollack, MD;  Location: [facility];  Service: Open Heart Surgery;  Laterality: N/A;  . INTRAOPERATIVE TRANSESOPHAGEAL ECHOCARDIOGRAM N/A 07/04/2013   Procedure: INTRAOPERATIVE TRANSESOPHAGEAL ECHOCARDIOGRAM;  Surgeon: Gaye Pollack, MD;  Location: [facility];  Service: Open Heart Surgery;  Laterality: N/A;  . LEFT HEART CATHETERIZATION WITH CORONARY ANGIOGRAM N/A 06/30/2013   Procedure: LEFT HEART CATHETERIZATION WITH CORONARY ANGIOGRAM;  Surgeon: Blane Ohara, MD;  Location[facility] C[facility] LAB;  Service: Cardiovascular;  Laterality: N/A;  . NASAL SEPTUM SURGERY     childhood    Current Medications: Current Meds  Medication Sig  . ACETAMINOPHEN PO Take 1,000 mg by mouth every 8 (eight) hours as needed.  . ASPIRIN LOW DOSE 81 MG EC tablet Take 81 mg by mouth daily.  [name] atenolol (TENORMIN) 25 MG tablet Take 25 mg by mouth at bedtime.  . Cholecalciferol 25 MCG (1000 UT) tablet Take 1 tablet by mouth daily.  . clopidogrel (PLAVIX) 75 MG tablet Take 1 tablet  by mouth daily.  . cyanocobalamin 1000 MCG tablet Take 1 tablet by mouth daily.  [name] ezetimibe (ZETIA) 10 MG tablet Take 10 mg by mouth daily.  . fluticasone (FLONASE) 50 MCG/ACT nasal spray Place 2 sprays into both nostrils daily as needed.  . gabapentin (NEURONTIN) 100 MG capsule Take 200 mg by mouth 3 (three) times daily.  . hydrALAZINE (APRESOLINE) 10 MG tablet Take by mouth.  . hydrALAZINE (APRESOLINE) 50 MG tablet Take 100 mg by mouth 3 (three) times daily.  . nitroGLYCERIN (NITROSTAT) 0.4 MG SL tablet  Place 1 tablet (0.4 mg total) under the tongue every 5 (five) minutes as needed.  . Omega-3 Fatty Acids (FISH OIL OMEGA-3) 1000 MG CAPS Take 1 capsule by mouth daily.  [name] SYNTHROID 112 MCG tablet Take 112 mcg by mouth daily before breakfast.   . [DISCONTINUED] acetaminophen (TYLENOL) 325 MG tablet Take 2 tablets (650 mg total) by mouth every 6 (six) hours as needed for mild pain.  . [DISCONTINUED] amLODipine (NORVASC) 5 MG tablet Take by mouth.  . [DISCONTINUED] aspirin 325 MG EC tablet Take 1 tablet (325 mg total) by mouth daily.  . [DISCONTINUED] chlorthalidone (HYGROTON) 25 MG tablet TAKE 1 TABLET BY MOUTH DAILY  . [DISCONTINUED] irbesartan (AVAPRO) 75 MG tablet Take 75 mg by mouth daily.  . [DISCONTINUED] isosorbide mononitrate (IMDUR) 30 MG 24 hr tablet Take 1 tablet (30 mg total) by mouth daily.  . [DISCONTINUED] TRADJENTA 5 MG TABS tablet Take 1 tablet by mouth daily at 12 noon.     Allergies:   Metformin, Ace inhibitors, Amlodipine, Ampicillin, Atorvastatin, Codeine, Elavil [amitriptyline], Hctz [hydrochlorothiazide], Hytrin [terazosin], Melatonin, Trazodone, Wasp venom, and Erythromycin   Social History   Socioeconomic History  . Marital status: Widowed    Spouse name: Not on file  . Number of children: Not on file  . Years of education: Not on file  . Highest education level: Not on file  Occupational History  . Not on file  Tobacco Use  . Smoking status: Never Smoker  . Smokeless tobacco: Never Used  Substance and Sexual Activity  . Alcohol use: No  . Drug use: No  . Sexual activity: Not on file  Other Topics Concern  . Not on file  Social History Narrative   Widow   Retired worked for [employer] and did a lot of travelling for [employer].   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   She has 1 son- lives in [place]   3 grandsons- college age (one is in [place])            Social  Determinants of Health   Financial Resource Strain:   . Difficulty of Paying Living Expenses: Not on file  Food Insecurity:   . Worried About Charity fundraiser in the Last Year: Not on file  . Ran Out of Food in the Last Year: Not on file  Transportation Needs:   . Lack of Transportation (Medical): Not on file  . Lack of Transportation (Non-Medical): Not on file  Physical Activity:   . Days of Exercise per Week: Not on file  . Minutes of Exercise per Session: Not on file  Stress:   . Feeling of Stress : Not on file  Social Connections:   . Frequency of Communication with Friends and Family: Not on file  . Frequency of Social Gatherings with Friends and Family: Not on  file  . Attends Religious Services: Not on file  . Active Member of Clubs or Organizations: Not on file  . Attends Archivist Meetings: Not on file  . Marital Status: Not on file     Family History: The patient's family history includes Cancer in her mother; Dementia in her mother; Diabetes in her father and paternal grandmother; Hypertension in her paternal grandmother.  ROS:   Please see the history of present illness.    All other systems reviewed and are negative.  EKGs/Labs/Other Studies Reviewed:    The following studies were reviewed today: Echocardiogram 10/14/2019: SUMMARY  Left ventricular systolic function is normal.  LV ejection fraction = 55-60%.  There is aortic valve sclerosis.  There is no aortic stenosis.  There is mild mitral regurgitation.  There is no comparison study available.    EKG:  EKG is not ordered today.   Recent Labs: No results found for requested labs within last 8760 hours.  Recent Lipid Panel    Component Value Date/Time   CHOL 226 (H) 02/23/2017 0307   CHOL 289 (H) 06/28/2016 1514   TRIG 176 (H) 02/23/2017 0307   HDL 31 (L) 02/23/2017 0307   HDL 39 (L) 06/28/2016 1514   CHOLHDL 7.3 02/23/2017 0307   VLDL 35 02/23/2017 0307   LDLCALC 160 (H) 02/23/2017  0307   LDLCALC 191 (H) 06/28/2016 1514   LDLDIRECT 227.0 11/30/2014 1534     Risk Assessment/Calculations:       Physical Exam:    VS:  BP 134/80   Pulse (!) 53   Ht 5\' 5"  (1.651 m)   Wt 156 lb 3.2 oz (70.9 kg)   SpO2 97%   BMI 25.99 kg/m     Wt Readings from Last 3 Encounters:  12/19/19 156 lb 3.2 oz (70.9 kg)  09/08/19 153 lb 6.4 oz (69.6 kg)  09/13/17 160 lb 6.4 oz (72.8 kg)     GEN: Well nourished, well developed in no acute distress HEENT: Normal NECK: No JVD; bilateral carotid bruits LYMPHATICS: No lymphadenopathy CARDIAC: RRR, 2/6 systolic murmur at the right upper sternal border RESPIRATORY:  Clear to auscultation without rales, wheezing or rhonchi  ABDOMEN: Soft, non-tender, non-distended MUSCULOSKELETAL:  No edema; No deformity  SKIN: Warm and dry NEUROLOGIC:  Alert and oriented x 3 PSYCHIATRIC:  Normal affect   ASSESSMENT:    1. Coronary artery disease involving coronary bypass graft of native heart with unstable angina pectoris ([place])   2. Mixed hyperlipidemia   3. Bilateral carotid artery stenosis   4. Essential hypertension    PLAN:    In order of problems listed above:  1. The patient appears stable without symptoms of angina.  She is treated with atenolol, low-dose aspirin, and clopidogrel.  No changes are made in her medical regimen today. 2. Statin intolerant.  Treated with Zetia.  Lipids are above goal.  She has been started on Zetia at the time of her hospitalization in August.  Will update a lipid panel since she has been on Zetia for just a few months.  I think she would benefit from a PCSK9 inhibitor with goal LDL cholesterol less than 55 mg/dL after recent TIA with known bilateral carotid stenosis and a background of type 2 diabetes. 3. Followed by vascular surgery as outlined above.  Clopidogrel started at the time of her stroke. 4. Blood pressure controlled on atenolol and hydralazine.   Medication Adjustments/Labs and Tests  Ordered: Current medicines are reviewed at length  with the patient today.  Concerns regarding medicines are outlined above.  Orders Placed This Encounter  Procedures  . AMB Referral to [facility]   No orders of the defined types were placed in this encounter.   Patient Instructions  Medication Instructions:  Your provider recommends that you continue on your current medications as directed. Please refer to the Current Medication list given to you today.   *If you need a refill on your cardiac medications before your next appointment, please call your pharmacy*   Follow-Up: You have been referred to the [facility].  At [facility], you and your health needs are our priority.  As part of our continuing mission to provide you with exceptional heart care, we have created designated Provider Care Teams.  These Care Teams include your primary Cardiologist (physician) and Advanced Practice Providers (APPs -  Physician Assistants and Nurse Practitioners) who all work together to provide you with the care you need, when you need it. Your next appointment:   12 month(s) The format for your next appointment:   In Person Provider:   You may see Sherren Mocha, MD or one of the following Advanced Practice Providers on your designated Care Team:    Richardson Dopp, PA-C  Robbie Lis, [place]      Signed, Sherren Mocha, MD  12/19/2019 3:33 PM    [place]

## 2019-12-26 DIAGNOSIS — R2681 Unsteadiness on feet: Secondary | ICD-10-CM | POA: Diagnosis not present

## 2019-12-26 DIAGNOSIS — Z7902 Long term (current) use of antithrombotics/antiplatelets: Secondary | ICD-10-CM | POA: Diagnosis not present

## 2019-12-26 DIAGNOSIS — I69398 Other sequelae of cerebral infarction: Secondary | ICD-10-CM | POA: Diagnosis not present

## 2019-12-26 DIAGNOSIS — E1122 Type 2 diabetes mellitus with diabetic chronic kidney disease: Secondary | ICD-10-CM | POA: Diagnosis not present

## 2019-12-26 DIAGNOSIS — M170 Bilateral primary osteoarthritis of knee: Secondary | ICD-10-CM | POA: Diagnosis not present

## 2019-12-26 DIAGNOSIS — N183 Chronic kidney disease, stage 3 unspecified: Secondary | ICD-10-CM | POA: Diagnosis not present

## 2019-12-26 DIAGNOSIS — I2510 Atherosclerotic heart disease of native coronary artery without angina pectoris: Secondary | ICD-10-CM | POA: Diagnosis not present

## 2019-12-26 DIAGNOSIS — E1151 Type 2 diabetes mellitus with diabetic peripheral angiopathy without gangrene: Secondary | ICD-10-CM | POA: Diagnosis not present

## 2019-12-26 DIAGNOSIS — Z7982 Long term (current) use of aspirin: Secondary | ICD-10-CM | POA: Diagnosis not present

## 2019-12-26 DIAGNOSIS — Z951 Presence of aortocoronary bypass graft: Secondary | ICD-10-CM | POA: Diagnosis not present

## 2019-12-26 DIAGNOSIS — I6521 Occlusion and stenosis of right carotid artery: Secondary | ICD-10-CM | POA: Diagnosis not present

## 2019-12-26 DIAGNOSIS — I6502 Occlusion and stenosis of left vertebral artery: Secondary | ICD-10-CM | POA: Diagnosis not present

## 2019-12-26 DIAGNOSIS — I97638 Postprocedural hematoma of a circulatory system organ or structure following other circulatory system procedure: Secondary | ICD-10-CM | POA: Diagnosis not present

## 2019-12-26 DIAGNOSIS — E785 Hyperlipidemia, unspecified: Secondary | ICD-10-CM | POA: Diagnosis not present

## 2019-12-26 DIAGNOSIS — E039 Hypothyroidism, unspecified: Secondary | ICD-10-CM | POA: Diagnosis not present

## 2019-12-26 DIAGNOSIS — I150 Renovascular hypertension: Secondary | ICD-10-CM | POA: Diagnosis not present

## 2019-12-26 DIAGNOSIS — D352 Benign neoplasm of pituitary gland: Secondary | ICD-10-CM | POA: Diagnosis not present

## 2019-12-28 NOTE — Progress Notes (Unsigned)
Patient ID: UMI MAINOR                 DOB: [birth year]                    MRN: [id number]     HPI: Amanda Curtis is a 84 y.o. female patient referred to lipid clinic by Dr. Burt Knack. PMH is significant for HLD, CAD s/p CABG in 2015, bilateral carotid stenosis, HTN, DM, arthritis, and history of L MCA infarct in 2018.  Started on ezetimibe in august 2021  Insurance as of 2019 AARP Medicare Complete Plan 1 (HMO) [phone] PBP# [id number] - Repatha or Praluent $47 - Vascepa $100 - Nexletol not on formulary - Try crestor 10 TIW?  Current Medications: ezetimibe 10 mg daily, OTC fish oil 1g daily Intolerances: atorvastatin 20-80 mg daily, rosuvastatin 10 mg daily, pravastatin 40-80 mg daily, pitavastatin 2 mg daily Risk Factors: ASCVD, HTN, DM, bilateral carotid stenosis LDL goal: < 55 mg/dL  Diet:   Exercise:   Family History:   Social History: Never smoker  Labs: 10/13/19: TC 292, TG 219, HDL 43, LDL 205 (no lipid-lowering medication) 12/12/19: TC 221, TG 281, HDL 47, LDL 145 (ezetimibe 10 mg daily)  Past Medical History:  Diagnosis Date  . Arthritis   . Diabetes mellitus without complication ([place])   . Graves disease 2000   s/p RIA  . Hypertension   . Insomnia   . Pituitary tumor   . Stroke ([place])   . Vitamin D deficiency     Current Outpatient Medications on File Prior to Visit  Medication Sig Dispense Refill  . ACETAMINOPHEN PO Take 1,000 mg by mouth every 8 (eight) hours as needed.    . ASPIRIN LOW DOSE 81 MG EC tablet Take 81 mg by mouth daily.    [name] atenolol (TENORMIN) 25 MG tablet Take 25 mg by mouth at bedtime.    . Cholecalciferol 25 MCG (1000 UT) tablet Take 1 tablet by mouth daily.    . clopidogrel (PLAVIX) 75 MG tablet Take 1 tablet by mouth daily.    . cyanocobalamin 1000 MCG tablet Take 1 tablet by mouth daily.    [name] ezetimibe (ZETIA) 10 MG tablet Take 10 mg by mouth daily.    . fluticasone (FLONASE) 50 MCG/ACT nasal spray Place 2 sprays into both nostrils daily as  needed.    . gabapentin (NEURONTIN) 100 MG capsule Take 200 mg by mouth 3 (three) times daily.    . hydrALAZINE (APRESOLINE) 10 MG tablet Take by mouth.    . hydrALAZINE (APRESOLINE) 50 MG tablet Take 100 mg by mouth 3 (three) times daily.    . nitroGLYCERIN (NITROSTAT) 0.4 MG SL tablet Place 1 tablet (0.4 mg total) under the tongue every 5 (five) minutes as needed. 25 tablet 3  . Omega-3 Fatty Acids (FISH OIL OMEGA-3) 1000 MG CAPS Take 1 capsule by mouth daily.    [name] SYNTHROID 112 MCG tablet Take 112 mcg by mouth daily before breakfast.   2   No current facility-administered medications on file prior to visit.    Allergies  Allergen Reactions  . Metformin Other (See Comments)  . Ace Inhibitors Cough  . Amlodipine Other (See Comments)    Severe leg cramps Severe leg cramps  . Ampicillin Other (See Comments)    rash  . Atorvastatin Other (See Comments)    Severe muscle cramping Severe muscle cramping  . Codeine Other (See Comments)    dizziness dizziness  .  Elavil [Amitriptyline] Other (See Comments)    Tongue swelling Tongue swelling  . Hctz [Hydrochlorothiazide] Other (See Comments)    Severe leg cramps Severe leg cramps Severe leg cramps  . Hytrin [Terazosin] Other (See Comments)    syncope  . Melatonin Other (See Comments)    Headaches Headaches Headaches  . Trazodone Other (See Comments)  . Wasp Venom Other (See Comments)    Hand swelling Hand swelling  . Erythromycin Rash    Assessment/Plan:  1. Hyperlipidemia -

## 2019-12-29 ENCOUNTER — Telehealth: Payer: Self-pay | Admitting: Pharmacist

## 2019-12-29 ENCOUNTER — Ambulatory Visit: Payer: Medicare Other

## 2019-12-29 NOTE — Telephone Encounter (Signed)
Pt missed appt today in lipid clinic. Left message for pt to reschedule appt.

## 2019-12-31 NOTE — Telephone Encounter (Signed)
Appt rescheduled for 11/9 at 2:30

## 2020-01-02 DIAGNOSIS — D352 Benign neoplasm of pituitary gland: Secondary | ICD-10-CM | POA: Diagnosis not present

## 2020-01-02 DIAGNOSIS — Z7982 Long term (current) use of aspirin: Secondary | ICD-10-CM | POA: Diagnosis not present

## 2020-01-02 DIAGNOSIS — Z7902 Long term (current) use of antithrombotics/antiplatelets: Secondary | ICD-10-CM | POA: Diagnosis not present

## 2020-01-02 DIAGNOSIS — E1151 Type 2 diabetes mellitus with diabetic peripheral angiopathy without gangrene: Secondary | ICD-10-CM | POA: Diagnosis not present

## 2020-01-02 DIAGNOSIS — I97638 Postprocedural hematoma of a circulatory system organ or structure following other circulatory system procedure: Secondary | ICD-10-CM | POA: Diagnosis not present

## 2020-01-02 DIAGNOSIS — E785 Hyperlipidemia, unspecified: Secondary | ICD-10-CM | POA: Diagnosis not present

## 2020-01-02 DIAGNOSIS — I6502 Occlusion and stenosis of left vertebral artery: Secondary | ICD-10-CM | POA: Diagnosis not present

## 2020-01-02 DIAGNOSIS — I2510 Atherosclerotic heart disease of native coronary artery without angina pectoris: Secondary | ICD-10-CM | POA: Diagnosis not present

## 2020-01-02 DIAGNOSIS — N183 Chronic kidney disease, stage 3 unspecified: Secondary | ICD-10-CM | POA: Diagnosis not present

## 2020-01-02 DIAGNOSIS — E1122 Type 2 diabetes mellitus with diabetic chronic kidney disease: Secondary | ICD-10-CM | POA: Diagnosis not present

## 2020-01-02 DIAGNOSIS — E039 Hypothyroidism, unspecified: Secondary | ICD-10-CM | POA: Diagnosis not present

## 2020-01-02 DIAGNOSIS — Z951 Presence of aortocoronary bypass graft: Secondary | ICD-10-CM | POA: Diagnosis not present

## 2020-01-02 DIAGNOSIS — I6521 Occlusion and stenosis of right carotid artery: Secondary | ICD-10-CM | POA: Diagnosis not present

## 2020-01-02 DIAGNOSIS — I150 Renovascular hypertension: Secondary | ICD-10-CM | POA: Diagnosis not present

## 2020-01-02 DIAGNOSIS — M170 Bilateral primary osteoarthritis of knee: Secondary | ICD-10-CM | POA: Diagnosis not present

## 2020-01-02 DIAGNOSIS — R2681 Unsteadiness on feet: Secondary | ICD-10-CM | POA: Diagnosis not present

## 2020-01-02 DIAGNOSIS — I69398 Other sequelae of cerebral infarction: Secondary | ICD-10-CM | POA: Diagnosis not present

## 2020-01-06 ENCOUNTER — Ambulatory Visit (INDEPENDENT_AMBULATORY_CARE_PROVIDER_SITE_OTHER): Payer: Medicare Other | Admitting: Pharmacist

## 2020-01-06 ENCOUNTER — Other Ambulatory Visit: Payer: Self-pay

## 2020-01-06 ENCOUNTER — Ambulatory Visit: Payer: Medicare Other

## 2020-01-06 ENCOUNTER — Telehealth: Payer: Self-pay | Admitting: Pharmacist

## 2020-01-06 DIAGNOSIS — I63132 Cerebral infarction due to embolism of left carotid artery: Secondary | ICD-10-CM | POA: Diagnosis not present

## 2020-01-06 DIAGNOSIS — E7800 Pure hypercholesterolemia, unspecified: Secondary | ICD-10-CM

## 2020-01-06 DIAGNOSIS — M791 Myalgia, unspecified site: Secondary | ICD-10-CM | POA: Diagnosis not present

## 2020-01-06 DIAGNOSIS — T466X5A Adverse effect of antihyperlipidemic and antiarteriosclerotic drugs, initial encounter: Secondary | ICD-10-CM | POA: Diagnosis not present

## 2020-01-06 DIAGNOSIS — I2510 Atherosclerotic heart disease of native coronary artery without angina pectoris: Secondary | ICD-10-CM | POA: Diagnosis not present

## 2020-01-06 MED ORDER — REPATHA SURECLICK 140 MG/ML ~~LOC~~ SOAJ: 1.0000 mL | SUBCUTANEOUS | 1 refills | Status: DC

## 2020-01-06 NOTE — Progress Notes (Signed)
Patient ID: Amanda Curtis                 DOB: 08/12/1935                    MRN: [id number]     HPI: Amanda Curtis is a 84 y.o. female patient referred to lipid clinic by . PMH is significant for stroke, T2DM, HTN, CAD, CKD, and s/p CABG x 4.  Has previously tried atorvastatin, rosuvastatin, and pitavastatin and could not tolerate.  Previously was resistant to starting PCSK9i.  Medications and care are provided by grandson and daughter in law.  Seen by Dr Burt Knack on 10/22 and referred to lipid clinic after treatment failure with Zetia.  Patient presents today alone with her list of medications.  Believes she takes too many meds and they are not working.  [name] makes her pill box weekly and she says she takes too many.  Is upset by the medications she was put on at [facility] and a skilled nursing facility and is very upset by the experience.  Medication list has maintenance medications such as hydralazine, atenolol, clopidogrel, and synthroid but also vitamins such as B12 and vitamin D.    Reports that all of her medications make her feel tired and weak.  Current Medications: Zetia 10mg  Intolerances: pitavastatin, rosuvastatin, atorvastatin Risk Factors: DM, hx of stroke,  LDL goal: <55  Labs: Direct LDL 145, TC 221, Trigs 281, HDL 47 (12/12/19 on Zetia 10)  Past Medical History:  Diagnosis Date  . Arthritis   . Diabetes mellitus without complication ([place])   . Graves disease 2000   s/p RIA  . Hypertension   . Insomnia   . Pituitary tumor   . Stroke ([place])   . Vitamin D deficiency     Current Outpatient Medications on File Prior to Visit  Medication Sig Dispense Refill  . ACETAMINOPHEN PO Take 1,000 mg by mouth every 8 (eight) hours as needed.    . ASPIRIN LOW DOSE 81 MG EC tablet Take 81 mg by mouth daily.    [name] atenolol (TENORMIN) 25 MG tablet Take 25 mg by mouth at bedtime.    . Cholecalciferol 25 MCG (1000 UT) tablet Take 1 tablet by mouth daily.    . clopidogrel (PLAVIX)  75 MG tablet Take 1 tablet by mouth daily.    . cyanocobalamin 1000 MCG tablet Take 1 tablet by mouth daily.    [name] ezetimibe (ZETIA) 10 MG tablet Take 10 mg by mouth daily.    . fluticasone (FLONASE) 50 MCG/ACT nasal spray Place 2 sprays into both nostrils daily as needed.    . gabapentin (NEURONTIN) 100 MG capsule Take 200 mg by mouth 3 (three) times daily.    . hydrALAZINE (APRESOLINE) 10 MG tablet Take by mouth.    . hydrALAZINE (APRESOLINE) 50 MG tablet Take 100 mg by mouth 3 (three) times daily.    . nitroGLYCERIN (NITROSTAT) 0.4 MG SL tablet Place 1 tablet (0.4 mg total) under the tongue every 5 (five) minutes as needed. 25 tablet 3  . Omega-3 Fatty Acids (FISH OIL OMEGA-3) 1000 MG CAPS Take 1 capsule by mouth daily.    [name] SYNTHROID 112 MCG tablet Take 112 mcg by mouth daily before breakfast.   2   No current facility-administered medications on file prior to visit.    Allergies  Allergen Reactions  . Metformin Other (See Comments)  . Ace Inhibitors Cough  . Amlodipine Other (See  Comments)    Severe leg cramps Severe leg cramps  . Ampicillin Other (See Comments)    rash  . Atorvastatin Other (See Comments)    Severe muscle cramping Severe muscle cramping  . Codeine Other (See Comments)    dizziness dizziness  . Elavil [Amitriptyline] Other (See Comments)    Tongue swelling Tongue swelling  . Hctz [Hydrochlorothiazide] Other (See Comments)    Severe leg cramps Severe leg cramps Severe leg cramps  . Hytrin [Terazosin] Other (See Comments)    syncope  . Melatonin Other (See Comments)    Headaches Headaches Headaches  . Trazodone Other (See Comments)  . Wasp Venom Other (See Comments)    Hand swelling Hand swelling  . Erythromycin Rash    Assessment/Plan:  1. Hyperlipidemia - Patient LDL 145 on 10/15 which is above goal of <55 due to T2DM and history of CAD and stroke.  Patient needs aggressive lipid lowering therapy but is intolerant to statins and has had  treatment failure with Zetia.  Next step would be PCSK9i therapy.  Using demo pen,. Educated patient on storage, site selection, and administration.  Patient was able to demonstrate on her own in room but reports her daughter in law will likely have to do it for her.  Will complete PA.  Patient wanted to reduce medication list.  Advised that the only medications she may not need at this time are her vitamin supplements .  Spoke with grandson over the phone who reported he would like the supplements to stay on her list since he is the one who fills her med box.    Will recheck lipid panel in 2-3 months following PA approval  Karren Cobble, PharmD, BCACP, [facility] [address] [place], [place] [address] Phone: [phone]; Fax: [phone] 01/07/2020 1:21 PM

## 2020-01-06 NOTE — Telephone Encounter (Addendum)
Pt grandson, Ebony Hail called to discuss pt visit with you. Phone # [phone] Please call back

## 2020-01-06 NOTE — Telephone Encounter (Signed)
Spoke with grandson regarding mother's medications and visit.  Explained reason for starting Repatha/Praluent and offered to teach him how to use pen

## 2020-01-06 NOTE — Patient Instructions (Addendum)
It was nice meeting you today!  Your LDL (bad cholesterol) was 145 on 12/12/19.  We would like to have it less than 55.  We are going to start a new medication for you that you will inject once every 14 days (2 weeks).  There are two available, one is called Repatha and one is called Praluent.  Both work the same way.  I took off some of the medications on your list since you said you do not want to take so many pills.  Please call with any questions  Karren Cobble, PharmD, BCACP, [facility] [address], [place], [place] [address] Phone: [phone]; Fax: [phone] 01/06/2020 2:59 PM

## 2020-01-07 ENCOUNTER — Telehealth: Payer: Self-pay | Admitting: Pharmacist

## 2020-01-07 DIAGNOSIS — I150 Renovascular hypertension: Secondary | ICD-10-CM | POA: Diagnosis not present

## 2020-01-07 DIAGNOSIS — Z951 Presence of aortocoronary bypass graft: Secondary | ICD-10-CM | POA: Diagnosis not present

## 2020-01-07 DIAGNOSIS — T466X5A Adverse effect of antihyperlipidemic and antiarteriosclerotic drugs, initial encounter: Secondary | ICD-10-CM | POA: Insufficient documentation

## 2020-01-07 DIAGNOSIS — I97638 Postprocedural hematoma of a circulatory system organ or structure following other circulatory system procedure: Secondary | ICD-10-CM | POA: Diagnosis not present

## 2020-01-07 DIAGNOSIS — E785 Hyperlipidemia, unspecified: Secondary | ICD-10-CM | POA: Diagnosis not present

## 2020-01-07 DIAGNOSIS — Z7902 Long term (current) use of antithrombotics/antiplatelets: Secondary | ICD-10-CM | POA: Diagnosis not present

## 2020-01-07 DIAGNOSIS — I63132 Cerebral infarction due to embolism of left carotid artery: Secondary | ICD-10-CM

## 2020-01-07 DIAGNOSIS — I6521 Occlusion and stenosis of right carotid artery: Secondary | ICD-10-CM | POA: Diagnosis not present

## 2020-01-07 DIAGNOSIS — Z7982 Long term (current) use of aspirin: Secondary | ICD-10-CM | POA: Diagnosis not present

## 2020-01-07 DIAGNOSIS — I6502 Occlusion and stenosis of left vertebral artery: Secondary | ICD-10-CM | POA: Diagnosis not present

## 2020-01-07 DIAGNOSIS — R2681 Unsteadiness on feet: Secondary | ICD-10-CM | POA: Diagnosis not present

## 2020-01-07 DIAGNOSIS — M170 Bilateral primary osteoarthritis of knee: Secondary | ICD-10-CM | POA: Diagnosis not present

## 2020-01-07 DIAGNOSIS — E039 Hypothyroidism, unspecified: Secondary | ICD-10-CM | POA: Diagnosis not present

## 2020-01-07 DIAGNOSIS — E7800 Pure hypercholesterolemia, unspecified: Secondary | ICD-10-CM

## 2020-01-07 DIAGNOSIS — M791 Myalgia, unspecified site: Secondary | ICD-10-CM | POA: Insufficient documentation

## 2020-01-07 DIAGNOSIS — D352 Benign neoplasm of pituitary gland: Secondary | ICD-10-CM | POA: Diagnosis not present

## 2020-01-07 DIAGNOSIS — I69398 Other sequelae of cerebral infarction: Secondary | ICD-10-CM | POA: Diagnosis not present

## 2020-01-07 DIAGNOSIS — I2510 Atherosclerotic heart disease of native coronary artery without angina pectoris: Secondary | ICD-10-CM

## 2020-01-07 DIAGNOSIS — E1151 Type 2 diabetes mellitus with diabetic peripheral angiopathy without gangrene: Secondary | ICD-10-CM | POA: Diagnosis not present

## 2020-01-07 DIAGNOSIS — E1122 Type 2 diabetes mellitus with diabetic chronic kidney disease: Secondary | ICD-10-CM | POA: Diagnosis not present

## 2020-01-07 DIAGNOSIS — N183 Chronic kidney disease, stage 3 unspecified: Secondary | ICD-10-CM | POA: Diagnosis not present

## 2020-01-07 NOTE — Telephone Encounter (Signed)
PA for Repatha approved through 07/06/20.  Called grandson and he is aware

## 2020-06-03 DIAGNOSIS — E1142 Type 2 diabetes mellitus with diabetic polyneuropathy: Secondary | ICD-10-CM | POA: Diagnosis not present

## 2020-06-03 DIAGNOSIS — D352 Benign neoplasm of pituitary gland: Secondary | ICD-10-CM | POA: Diagnosis not present

## 2020-06-03 DIAGNOSIS — E782 Mixed hyperlipidemia: Secondary | ICD-10-CM | POA: Diagnosis not present

## 2020-06-03 DIAGNOSIS — I639 Cerebral infarction, unspecified: Secondary | ICD-10-CM | POA: Diagnosis not present

## 2020-06-03 DIAGNOSIS — D649 Anemia, unspecified: Secondary | ICD-10-CM | POA: Diagnosis not present

## 2020-06-03 DIAGNOSIS — E039 Hypothyroidism, unspecified: Secondary | ICD-10-CM | POA: Diagnosis not present

## 2020-06-03 DIAGNOSIS — R413 Other amnesia: Secondary | ICD-10-CM | POA: Diagnosis not present

## 2020-06-03 DIAGNOSIS — E538 Deficiency of other specified B group vitamins: Secondary | ICD-10-CM | POA: Diagnosis not present

## 2020-06-03 DIAGNOSIS — N1832 Chronic kidney disease, stage 3b: Secondary | ICD-10-CM | POA: Diagnosis not present

## 2020-06-03 DIAGNOSIS — E559 Vitamin D deficiency, unspecified: Secondary | ICD-10-CM | POA: Diagnosis not present

## 2020-06-03 DIAGNOSIS — Z9114 Patient's other noncompliance with medication regimen: Secondary | ICD-10-CM | POA: Diagnosis not present

## 2020-06-03 DIAGNOSIS — I129 Hypertensive chronic kidney disease with stage 1 through stage 4 chronic kidney disease, or unspecified chronic kidney disease: Secondary | ICD-10-CM | POA: Diagnosis not present

## 2020-06-15 MED ORDER — REPATHA SURECLICK 140 MG/ML ~~LOC~~ SOAJ: 1.0000 mL | SUBCUTANEOUS | 3 refills | Status: DC

## 2020-06-15 NOTE — Addendum Note (Signed)
Addended by: Kennidy Lamke E on: 06/15/2020 07:43 AM   Modules accepted: Orders

## 2021-06-08 DIAGNOSIS — E1142 Type 2 diabetes mellitus with diabetic polyneuropathy: Secondary | ICD-10-CM | POA: Diagnosis not present

## 2021-06-08 DIAGNOSIS — D352 Benign neoplasm of pituitary gland: Secondary | ICD-10-CM | POA: Diagnosis not present

## 2021-06-08 DIAGNOSIS — E039 Hypothyroidism, unspecified: Secondary | ICD-10-CM | POA: Diagnosis not present

## 2021-08-02 DIAGNOSIS — M1712 Unilateral primary osteoarthritis, left knee: Secondary | ICD-10-CM | POA: Diagnosis not present

## 2021-08-02 DIAGNOSIS — I10 Essential (primary) hypertension: Secondary | ICD-10-CM | POA: Diagnosis not present

## 2021-08-02 DIAGNOSIS — M25562 Pain in left knee: Secondary | ICD-10-CM | POA: Diagnosis not present

## 2021-08-02 DIAGNOSIS — E1142 Type 2 diabetes mellitus with diabetic polyneuropathy: Secondary | ICD-10-CM | POA: Diagnosis not present

## 2021-08-02 DIAGNOSIS — G8929 Other chronic pain: Secondary | ICD-10-CM | POA: Diagnosis not present

## 2021-08-02 DIAGNOSIS — M23204 Derangement of unspecified medial meniscus due to old tear or injury, left knee: Secondary | ICD-10-CM | POA: Diagnosis not present

## 2021-12-15 DIAGNOSIS — E039 Hypothyroidism, unspecified: Secondary | ICD-10-CM | POA: Diagnosis not present

## 2021-12-15 DIAGNOSIS — Z0000 Encounter for general adult medical examination without abnormal findings: Secondary | ICD-10-CM | POA: Diagnosis not present

## 2021-12-15 DIAGNOSIS — E559 Vitamin D deficiency, unspecified: Secondary | ICD-10-CM | POA: Diagnosis not present

## 2021-12-15 DIAGNOSIS — E1142 Type 2 diabetes mellitus with diabetic polyneuropathy: Secondary | ICD-10-CM | POA: Diagnosis not present

## 2021-12-15 DIAGNOSIS — E782 Mixed hyperlipidemia: Secondary | ICD-10-CM | POA: Diagnosis not present

## 2021-12-15 DIAGNOSIS — E538 Deficiency of other specified B group vitamins: Secondary | ICD-10-CM | POA: Diagnosis not present

## 2021-12-15 DIAGNOSIS — I25810 Atherosclerosis of coronary artery bypass graft(s) without angina pectoris: Secondary | ICD-10-CM | POA: Diagnosis not present

## 2021-12-15 DIAGNOSIS — I10 Essential (primary) hypertension: Secondary | ICD-10-CM | POA: Diagnosis not present

## 2021-12-15 DIAGNOSIS — R413 Other amnesia: Secondary | ICD-10-CM | POA: Diagnosis not present

## 2021-12-15 DIAGNOSIS — M791 Myalgia, unspecified site: Secondary | ICD-10-CM | POA: Diagnosis not present

## 2021-12-19 DIAGNOSIS — E039 Hypothyroidism, unspecified: Secondary | ICD-10-CM | POA: Diagnosis not present

## 2021-12-19 DIAGNOSIS — E782 Mixed hyperlipidemia: Secondary | ICD-10-CM | POA: Diagnosis not present

## 2021-12-19 DIAGNOSIS — E559 Vitamin D deficiency, unspecified: Secondary | ICD-10-CM | POA: Diagnosis not present

## 2021-12-19 DIAGNOSIS — I10 Essential (primary) hypertension: Secondary | ICD-10-CM | POA: Diagnosis not present

## 2021-12-19 DIAGNOSIS — E1142 Type 2 diabetes mellitus with diabetic polyneuropathy: Secondary | ICD-10-CM | POA: Diagnosis not present

## 2021-12-19 DIAGNOSIS — R413 Other amnesia: Secondary | ICD-10-CM | POA: Diagnosis not present

## 2021-12-19 DIAGNOSIS — E538 Deficiency of other specified B group vitamins: Secondary | ICD-10-CM | POA: Diagnosis not present

## 2022-01-31 ENCOUNTER — Ambulatory Visit: Payer: Medicare Other | Admitting: Family

## 2022-03-09 ENCOUNTER — Other Ambulatory Visit (HOSPITAL_COMMUNITY): Payer: Self-pay

## 2022-03-14 DIAGNOSIS — I10 Essential (primary) hypertension: Secondary | ICD-10-CM | POA: Diagnosis not present

## 2022-03-14 DIAGNOSIS — M1712 Unilateral primary osteoarthritis, left knee: Secondary | ICD-10-CM | POA: Diagnosis not present

## 2022-03-14 DIAGNOSIS — E1142 Type 2 diabetes mellitus with diabetic polyneuropathy: Secondary | ICD-10-CM | POA: Diagnosis not present

## 2022-05-10 DIAGNOSIS — F01A11 Vascular dementia, mild, with agitation: Secondary | ICD-10-CM | POA: Insufficient documentation

## 2023-01-02 ENCOUNTER — Encounter (HOSPITAL_COMMUNITY): Payer: Self-pay | Admitting: Emergency Medicine

## 2023-01-02 ENCOUNTER — Inpatient Hospital Stay (HOSPITAL_COMMUNITY): Payer: Medicare Other

## 2023-01-02 ENCOUNTER — Other Ambulatory Visit: Payer: Self-pay

## 2023-01-02 ENCOUNTER — Inpatient Hospital Stay (HOSPITAL_COMMUNITY)
Admission: EM | Admit: 2023-01-02 | Discharge: 2023-01-04 | DRG: 312 | Disposition: A | Discharge status: Home / Self Care | Payer: Medicare Other | Attending: Internal Medicine | Admitting: Internal Medicine

## 2023-01-02 DIAGNOSIS — Z8249 Family history of ischemic heart disease and other diseases of the circulatory system: Secondary | ICD-10-CM | POA: Diagnosis not present

## 2023-01-02 DIAGNOSIS — R54 Age-related physical debility: Secondary | ICD-10-CM | POA: Diagnosis present

## 2023-01-02 DIAGNOSIS — E785 Hyperlipidemia, unspecified: Secondary | ICD-10-CM | POA: Diagnosis present

## 2023-01-02 DIAGNOSIS — Z951 Presence of aortocoronary bypass graft: Secondary | ICD-10-CM

## 2023-01-02 DIAGNOSIS — G4700 Insomnia, unspecified: Secondary | ICD-10-CM | POA: Diagnosis present

## 2023-01-02 DIAGNOSIS — Z7982 Long term (current) use of aspirin: Secondary | ICD-10-CM | POA: Diagnosis not present

## 2023-01-02 DIAGNOSIS — Z79890 Hormone replacement therapy (postmenopausal): Secondary | ICD-10-CM | POA: Diagnosis not present

## 2023-01-02 DIAGNOSIS — Z885 Allergy status to narcotic agent status: Secondary | ICD-10-CM

## 2023-01-02 DIAGNOSIS — Z888 Allergy status to other drugs, medicaments and biological substances status: Secondary | ICD-10-CM

## 2023-01-02 DIAGNOSIS — I2510 Atherosclerotic heart disease of native coronary artery without angina pectoris: Secondary | ICD-10-CM | POA: Diagnosis present

## 2023-01-02 DIAGNOSIS — I129 Hypertensive chronic kidney disease with stage 1 through stage 4 chronic kidney disease, or unspecified chronic kidney disease: Secondary | ICD-10-CM | POA: Diagnosis present

## 2023-01-02 DIAGNOSIS — E1122 Type 2 diabetes mellitus with diabetic chronic kidney disease: Secondary | ICD-10-CM | POA: Diagnosis present

## 2023-01-02 DIAGNOSIS — Z79899 Other long term (current) drug therapy: Secondary | ICD-10-CM

## 2023-01-02 DIAGNOSIS — E039 Hypothyroidism, unspecified: Secondary | ICD-10-CM | POA: Diagnosis present

## 2023-01-02 DIAGNOSIS — R001 Bradycardia, unspecified: Secondary | ICD-10-CM | POA: Diagnosis present

## 2023-01-02 DIAGNOSIS — N1831 Chronic kidney disease, stage 3a: Secondary | ICD-10-CM | POA: Diagnosis present

## 2023-01-02 DIAGNOSIS — Z7902 Long term (current) use of antithrombotics/antiplatelets: Secondary | ICD-10-CM

## 2023-01-02 DIAGNOSIS — R55 Syncope and collapse: Principal | ICD-10-CM

## 2023-01-02 DIAGNOSIS — Z1152 Encounter for screening for COVID-19: Secondary | ICD-10-CM

## 2023-01-02 DIAGNOSIS — N179 Acute kidney failure, unspecified: Principal | ICD-10-CM | POA: Diagnosis not present

## 2023-01-02 DIAGNOSIS — N183 Chronic kidney disease, stage 3 unspecified: Secondary | ICD-10-CM | POA: Diagnosis present

## 2023-01-02 DIAGNOSIS — D72829 Elevated white blood cell count, unspecified: Secondary | ICD-10-CM

## 2023-01-02 DIAGNOSIS — Z833 Family history of diabetes mellitus: Secondary | ICD-10-CM

## 2023-01-02 DIAGNOSIS — Z8673 Personal history of transient ischemic attack (TIA), and cerebral infarction without residual deficits: Secondary | ICD-10-CM | POA: Diagnosis not present

## 2023-01-02 DIAGNOSIS — I428 Other cardiomyopathies: Secondary | ICD-10-CM | POA: Diagnosis not present

## 2023-01-02 LAB — CBC
HCT: 42.1 % (ref 36.0–46.0)
HCT: 40.1 % (ref 36.0–46.0)
Hemoglobin: 13.1 g/dL (ref 12.0–15.0)
Hemoglobin: 12.9 g/dL (ref 12.0–15.0)
MCH: 28.7 pg (ref 26.0–34.0)
MCH: 29.3 pg (ref 26.0–34.0)
MCHC: 31.1 g/dL (ref 30.0–36.0)
MCHC: 32.2 g/dL (ref 30.0–36.0)
MCV: 92.1 fL (ref 80.0–100.0)
MCV: 91.1 fL (ref 80.0–100.0)
Platelets: 414 10*3/uL — ABNORMAL HIGH (ref 150–400)
Platelets: 378 10*3/uL (ref 150–400)
RBC: 4.57 MIL/uL (ref 3.87–5.11)
RBC: 4.4 MIL/uL (ref 3.87–5.11)
RDW: 14.7 % (ref 11.5–15.5)
RDW: 14.8 % (ref 11.5–15.5)
WBC: 11.2 10*3/uL — ABNORMAL HIGH (ref 4.0–10.5)
WBC: 10.2 10*3/uL (ref 4.0–10.5)
nRBC: 0 % (ref 0.0–0.2)
nRBC: 0 % (ref 0.0–0.2)

## 2023-01-02 LAB — RESPIRATORY PANEL BY PCR

## 2023-01-02 LAB — URINALYSIS, ROUTINE W REFLEX MICROSCOPIC
Bilirubin Urine: NEGATIVE
Glucose, UA: NEGATIVE mg/dL
Hgb urine dipstick: NEGATIVE
Ketones, ur: NEGATIVE mg/dL
Nitrite: NEGATIVE
Protein, ur: NEGATIVE mg/dL
Specific Gravity, Urine: 1.011 (ref 1.005–1.030)
pH: 6 (ref 5.0–8.0)

## 2023-01-02 LAB — CREATININE, SERUM
Creatinine, Ser: 1.65 mg/dL — ABNORMAL HIGH (ref 0.44–1.00)
GFR, Estimated: 30 mL/min — ABNORMAL LOW (ref >60)

## 2023-01-02 LAB — BASIC METABOLIC PANEL
Anion gap: 10 (ref 5–15)
BUN: 26 mg/dL — ABNORMAL HIGH (ref 8–23)
CO2: 24 mmol/L (ref 22–32)
Calcium: 9.3 mg/dL (ref 8.9–10.3)
Chloride: 102 mmol/L (ref 98–111)
Creatinine, Ser: 1.57 mg/dL — ABNORMAL HIGH (ref 0.44–1.00)
GFR, Estimated: 32 mL/min — ABNORMAL LOW (ref >60)
Glucose, Bld: 120 mg/dL — ABNORMAL HIGH (ref 70–99)
Potassium: 4.4 mmol/L (ref 3.5–5.1)
Sodium: 136 mmol/L (ref 135–145)

## 2023-01-02 LAB — CBG MONITORING, ED: Glucose-Capillary: 112 mg/dL — ABNORMAL HIGH (ref 70–99)

## 2023-01-02 LAB — TROPONIN I (HIGH SENSITIVITY): Troponin I (High Sensitivity): 14 ng/L (ref <18)

## 2023-01-02 LAB — SARS CORONAVIRUS 2 BY RT PCR: SARS Coronavirus 2 by RT PCR: NEGATIVE

## 2023-01-02 LAB — CORTISOL-AM, BLOOD: Cortisol - AM: 8.4 ug/dL (ref 6.7–22.6)

## 2023-01-02 MED ORDER — CLOPIDOGREL BISULFATE 75 MG PO TABS
75.0000 mg | ORAL_TABLET | Freq: Every day | ORAL | Status: DC
  Administered 2023-01-02 – 2023-01-04 (×3): 75 mg via ORAL
  Filled 2023-01-02 (×3): qty 1

## 2023-01-02 MED ORDER — POLYETHYLENE GLYCOL 3350 17 G PO PACK: 17.0000 g | PACK | Freq: Every day | ORAL | Status: DC | PRN

## 2023-01-02 MED ORDER — TRAZODONE HCL 50 MG PO TABS: 50.0000 mg | ORAL_TABLET | Freq: Every evening | ORAL | Status: DC | PRN

## 2023-01-02 MED ORDER — IRBESARTAN 75 MG PO TABS
75.0000 mg | ORAL_TABLET | Freq: Every day | ORAL | Status: DC
  Administered 2023-01-03 – 2023-01-04 (×2): 75 mg via ORAL
  Filled 2023-01-02 (×2): qty 1

## 2023-01-02 MED ORDER — ACETAMINOPHEN 650 MG RE SUPP: 650.0000 mg | Freq: Four times a day (QID) | RECTAL | Status: DC | PRN

## 2023-01-02 MED ORDER — ATENOLOL 25 MG PO TABS: 25.0000 mg | ORAL_TABLET | Freq: Every day | ORAL | Status: DC

## 2023-01-02 MED ORDER — SALINE SPRAY 0.65 % NA SOLN: 1.0000 | NASAL | Status: DC | PRN

## 2023-01-02 MED ORDER — HYDRALAZINE HCL 20 MG/ML IJ SOLN: 10.0000 mg | INTRAMUSCULAR | Status: DC | PRN

## 2023-01-02 MED ORDER — IBUPROFEN 200 MG PO TABS
200.0000 mg | ORAL_TABLET | Freq: Once | ORAL | Status: AC
  Administered 2023-01-02: 200 mg via ORAL
  Filled 2023-01-02: qty 1

## 2023-01-02 MED ORDER — MELATONIN 3 MG PO TABS
3.0000 mg | ORAL_TABLET | Freq: Every evening | ORAL | Status: DC | PRN
  Administered 2023-01-02: 3 mg via ORAL
  Filled 2023-01-02: qty 1

## 2023-01-02 MED ORDER — SODIUM CHLORIDE 0.9% FLUSH
3.0000 mL | Freq: Two times a day (BID) | INTRAVENOUS | Status: DC
  Administered 2023-01-02 – 2023-01-04 (×4): 3 mL via INTRAVENOUS

## 2023-01-02 MED ORDER — SODIUM CHLORIDE 0.9 % IV BOLUS
500.0000 mL | Freq: Once | INTRAVENOUS | Status: AC
Start: 2023-01-02 — End: 2023-01-02
  Administered 2023-01-02: 500 mL via INTRAVENOUS

## 2023-01-02 MED ORDER — ENOXAPARIN SODIUM 40 MG/0.4ML IJ SOSY: 40.0000 mg | PREFILLED_SYRINGE | INTRAMUSCULAR | Status: DC

## 2023-01-02 MED ORDER — ACETAMINOPHEN 325 MG PO TABS
650.0000 mg | ORAL_TABLET | Freq: Four times a day (QID) | ORAL | Status: DC | PRN
  Administered 2023-01-04: 650 mg via ORAL
  Filled 2023-01-02: qty 2

## 2023-01-02 MED ORDER — HYDRALAZINE HCL 25 MG PO TABS
100.0000 mg | ORAL_TABLET | Freq: Once | ORAL | Status: AC
  Administered 2023-01-02: 100 mg via ORAL
  Filled 2023-01-02: qty 4

## 2023-01-02 MED ORDER — LACTATED RINGERS IV BOLUS: 500.0000 mL | Freq: Once | INTRAVENOUS | Status: DC

## 2023-01-02 MED ORDER — ENOXAPARIN SODIUM 30 MG/0.3ML IJ SOSY
30.0000 mg | PREFILLED_SYRINGE | INTRAMUSCULAR | Status: DC
  Administered 2023-01-03 – 2023-01-04 (×2): 30 mg via SUBCUTANEOUS
  Filled 2023-01-02 (×2): qty 0.3

## 2023-01-02 MED ORDER — LEVOTHYROXINE SODIUM 112 MCG PO TABS
112.0000 ug | ORAL_TABLET | Freq: Every day | ORAL | Status: DC
  Administered 2023-01-03: 112 ug via ORAL
  Filled 2023-01-02: qty 1

## 2023-01-02 MED ORDER — ASPIRIN 81 MG PO TBEC: 81.0000 mg | DELAYED_RELEASE_TABLET | Freq: Every day | ORAL | Status: DC

## 2023-01-02 MED ORDER — HYDRALAZINE HCL 25 MG PO TABS
25.0000 mg | ORAL_TABLET | Freq: Two times a day (BID) | ORAL | Status: DC
  Administered 2023-01-02 – 2023-01-04 (×4): 25 mg via ORAL
  Filled 2023-01-02 (×4): qty 1

## 2023-01-02 NOTE — ED Provider Triage Note (Addendum)
Emergency Medicine Provider Triage Evaluation Note  Amanda Curtis , a 87 y.o. female  was evaluated in triage.  Pt complains of feeling dizzy, generalized weakness, presyncope while in bathroom at 0900 this morning.  Felt better after sitting. She denies chest pain, SOB.   Review of Systems  Positive: Dizzy, presyncope Negative: Fevers  Physical Exam  BP (!) 207/93 (BP Location: Right Arm)   Pulse 60   Temp 98.4 F (36.9 C) (Oral)   Resp 18   SpO2 100%  Gen:   Awake, no distress   Resp:  Normal effort  MSK:   Moves extremities without difficulty  Other:    Medical Decision Making  Medically screening exam initiated at 12:04 PM.  Appropriate orders placed.  Amanda Curtis was informed that the remainder of the evaluation will be completed by another provider, this initial triage assessment does not replace that evaluation, and the importance of remaining in the ED until their evaluation is complete.  Labwork ordered    Judithann Sheen, PA 01/02/23 [phone]

## 2023-01-02 NOTE — H&P (Addendum)
History and Physical    PatientMarland Kitchen Amanda Curtis:[id number] DOB: [birth year] DOA: 01/02/2023 DOS: the patient was seen and examined on 01/02/2023 PCP: Ralene Ok, MD  Patient coming from:  [facility] independent living facility.  Chief Complaint:  Chief Complaint  Patient presents with   Near Syncope   HPI: Amanda Curtis is a 87 y.o. female with medical history significant of chronic knee arthritic osteoarthritis with limited ambulatory capacity.  I find the patient to be a limited historian.  But she reports that she is "generally weak "since she apparently had pituitary surgery in the past.  Patient further reports that she generally feels lightheaded as if she may pass out on and off.  She does not report any aggravating or relieving factor for this.  Patient further reports that she generally does not sleep overnight and she has been up as an ambulatory since around 4 AM this morning.  Patient reports that since around the same time she has been feeling more lightheaded than usual, which she further clarifies as a tendency to pass out.  However patient has not passed out.  And there is no trauma reported.  She does not report any aggravating or relieving factor.  For her lightheadedness.  However generally speaking patient does not feel lightheaded while she is in bed.  Patient denies any chest pain shortness of breath palpitation leg swelling or cramping.  Patient reports that her caregiver at home noticed her gait instability and advised that the patient come to the ER.  ER course is notable for patient being noted to be bradycardic with sinus bradycardia down to 50s.  However patient has at other times ambulated to the bathroom and reports ongoing left knee discomfort.  There has been no vomiting or diarrhea or fever.   Review of Systems: As mentioned in the history of present illness. All other systems reviewed and are negative. Past Medical History:  Diagnosis Date   Arthritis     Diabetes mellitus without complication (HCC)    Graves disease 2000   s/p RIA   Hypertension    Insomnia    Pituitary tumor    Stroke (HCC)    Vitamin D deficiency    Past Surgical History:  Procedure Laterality Date   APPENDECTOMY     CORONARY ARTERY BYPASS GRAFT N/A 07/04/2013   Procedure: CORONARY ARTERY BYPASS GRAFTING (CABG) times four using left internal mammary artery and right saphenous leg vein.;  Surgeon: Alleen Borne, MD;  Location: [facility] OR;  Service: Open Heart Surgery;  Laterality: N/A;   INTRAOPERATIVE TRANSESOPHAGEAL ECHOCARDIOGRAM N/A 07/04/2013   Procedure: INTRAOPERATIVE TRANSESOPHAGEAL ECHOCARDIOGRAM;  Surgeon: Alleen Borne, MD;  Location: [facility] OR;  Service: Open Heart Surgery;  Laterality: N/A;   LEFT HEART CATHETERIZATION WITH CORONARY ANGIOGRAM N/A 06/30/2013   Procedure: LEFT HEART CATHETERIZATION WITH CORONARY ANGIOGRAM;  Surgeon: Micheline Chapman, MD;  Location: [facility] CATH LAB;  Service: Cardiovascular;  Laterality: N/A;   NASAL SEPTUM SURGERY     childhood   Social History:  reports that she has never smoked. She has never used smokeless tobacco. She reports that she does not drink alcohol and does not use drugs.  Allergies  Allergen Reactions   Metformin Other (See Comments)   Ace Inhibitors Cough   Amlodipine Other (See Comments)    Severe leg cramps Severe leg cramps   Ampicillin Other (See Comments)    rash   Atorvastatin Other (See Comments)    Severe muscle cramping Severe  muscle cramping   Codeine Other (See Comments)    dizziness dizziness   Elavil [Amitriptyline] Other (See Comments)    Tongue swelling Tongue swelling   Hctz [Hydrochlorothiazide] Other (See Comments)    Severe leg cramps Severe leg cramps Severe leg cramps   Hytrin [Terazosin] Other (See Comments)    syncope   Melatonin Other (See Comments)    Headaches Headaches Headaches   Trazodone Other (See Comments)   Wasp Venom Other (See Comments)    Hand swelling Hand swelling    Erythromycin Rash    Family History  Problem Relation Age of Onset   Dementia Mother    Cancer Mother        breast   Diabetes Father    Diabetes Paternal Grandmother    Hypertension Paternal Grandmother     Prior to Admission medications   Medication Sig Start Date End Date Taking? Authorizing Provider  ASPIRIN LOW DOSE 81 MG EC tablet Take 81 mg by mouth daily. 12/02/19   [provider]  atenolol (TENORMIN) 25 MG tablet Take 25 mg by mouth at bedtime. 12/02/19   [provider]  Evolocumab (REPATHA SURECLICK) 140 MG/ML SOAJ Inject 1 mL into the skin every 14 (fourteen) days. 06/15/20   Tonny Bollman, MD  ezetimibe (ZETIA) 10 MG tablet Take 10 mg by mouth daily. 12/02/19   [provider]  fluticasone (FLONASE) 50 MCG/ACT nasal spray Place 2 sprays into both nostrils daily as needed. 09/12/19   [provider]  gabapentin (NEURONTIN) 100 MG capsule Take 200 mg by mouth 3 (three) times daily. 12/02/19   [provider]  hydrALAZINE (APRESOLINE) 50 MG tablet Take 100 mg by mouth 3 (three) times daily. 11/07/19   [provider]  nitroGLYCERIN (NITROSTAT) 0.4 MG SL tablet Place 1 tablet (0.4 mg total) under the tongue every 5 (five) minutes as needed. 09/08/19 12/19/19  Bhagat, Sharrell Ku, PA  Omega-3 Fatty Acids (FISH OIL OMEGA-3) 1000 MG CAPS Take 1 capsule by mouth daily.    [provider]  polyethylene glycol (MIRALAX / GLYCOLAX) 17 g packet Take 17 g by mouth daily.    [provider]  SYNTHROID 112 MCG tablet Take 112 mcg by mouth daily before breakfast.  06/13/17   [provider]    Physical Exam: Vitals:   01/02/23 1815 01/02/23 1830 01/02/23 1845 01/02/23 1859  BP: (!) 157/80 (!) 153/76 (!) 153/82   Pulse: (!) 58 (!) 58 (!) 58   Resp: 13 15 19    Temp:    98.4 F (36.9 C)  TempSrc:      SpO2: 96% 97% 98%    General: Patient is alert and awake, does not appear to be distressed.  However speaks  in slow pace.  Slightly fatigued appearing.  Gives a reasonably coherent account of her day today, although she does not directly answer many questions that are asked.  Possibly because she is hard of hearing. Respiratory exam: Bilateral intravesicular Cardiovascular exam S1-S2 normal Abdomen all quadrants soft nontender No jugular venous distention Extremities warm without edema.  No focal motor deficit, left knee movement limited by pain that is attributed to osteoarthritis. Data Reviewed:  Labs on Admission:  Results for orders placed or performed during the hospital encounter of 01/02/23 (from the past 24 hour(s))  Basic metabolic panel     Status: Abnormal   Collection Time: 01/02/23 12:02 PM  Result Value Ref Range   Sodium 136 135 - 145 mmol/L  Potassium 4.4 3.5 - 5.1 mmol/L   Chloride 102 98 - 111 mmol/L   CO2 24 22 - 32 mmol/L   Glucose, Bld 120 (H) 70 - 99 mg/dL   BUN 26 (H) 8 - 23 mg/dL   Creatinine, Ser 1.61 (H) 0.44 - 1.00 mg/dL   Calcium 9.3 8.9 - 09.6 mg/dL   GFR, Estimated 32 (L) >60 mL/min   Anion gap 10 5 - 15  CBC     Status: Abnormal   Collection Time: 01/02/23 12:02 PM  Result Value Ref Range   WBC 11.2 (H) 4.0 - 10.5 K/uL   RBC 4.57 3.87 - 5.11 MIL/uL   Hemoglobin 13.1 12.0 - 15.0 g/dL   HCT 04.5 40.9 - 81.1 %   MCV 92.1 80.0 - 100.0 fL   MCH 28.7 26.0 - 34.0 pg   MCHC 31.1 30.0 - 36.0 g/dL   RDW 91.4 78.2 - 95.6 %   Platelets 414 (H) 150 - 400 K/uL   nRBC 0.0 0.0 - 0.2 %  Urinalysis, Routine w reflex microscopic -Urine, Clean Catch     Status: Abnormal   Collection Time: 01/02/23  2:45 PM  Result Value Ref Range   Color, Urine YELLOW YELLOW   APPearance CLEAR CLEAR   Specific Gravity, Urine 1.011 1.005 - 1.030   pH 6.0 5.0 - 8.0   Glucose, UA NEGATIVE NEGATIVE mg/dL   Hgb urine dipstick NEGATIVE NEGATIVE   Bilirubin Urine NEGATIVE NEGATIVE   Ketones, ur NEGATIVE NEGATIVE mg/dL   Protein, ur NEGATIVE NEGATIVE mg/dL   Nitrite NEGATIVE NEGATIVE    Leukocytes,Ua SMALL (A) NEGATIVE   RBC / HPF 0-5 0 - 5 RBC/hpf   WBC, UA 0-5 0 - 5 WBC/hpf   Bacteria, UA RARE (A) NONE SEEN   Squamous Epithelial / HPF 0-5 0 - 5 /HPF  CBG monitoring, ED     Status: Abnormal   Collection Time: 01/02/23  2:50 PM  Result Value Ref Range   Glucose-Capillary 112 (H) 70 - 99 mg/dL   Basic Metabolic Panel: Recent Labs  Lab 01/02/23 1202  NA 136  K 4.4  CL 102  CO2 24  GLUCOSE 120*  BUN 26*  CREATININE 1.57*  CALCIUM 9.3   Liver Function Tests: No results for input(s): "AST", "ALT", "ALKPHOS", "BILITOT", "PROT", "ALBUMIN" in the last 168 hours. No results for input(s): "LIPASE", "AMYLASE" in the last 168 hours. No results for input(s): "AMMONIA" in the last 168 hours. CBC: Recent Labs  Lab 01/02/23 1202  WBC 11.2*  HGB 13.1  HCT 42.1  MCV 92.1  PLT 414*   Cardiac Enzymes: No results for input(s): "CKTOTAL", "CKMB", "CKMBINDEX", "TROPONINIHS" in the last 168 hours.  BNP (last 3 results) No results for input(s): "PROBNP" in the last 8760 hours. CBG: Recent Labs  Lab 01/02/23 1450  GLUCAP 112*    Radiological Exams on Admission:  No results found.  EKG: Independently reviewed. NSR   Assessment and Plan: Near syncope Patient reports near syncope since around 4 AM this morning that has been pretty consistent every time she gets up and ambulates.  Does not report any palpitation, associated with marked sensation of fatigue as well as some leukocytosis.  Overall clinical concern is some infectious process potentially viral.  Will check COVID-19 and respiratory viral panel as well as chest x-ray.  I will also check orthostatics, maintain the patient on telemetry.  Patient is noted to have bradycardia in the ER.  I am not sure if  it has correlated with patient's symptoms however.  I will treat the patient with reduction of her dose of atenolol starting tomorrow, I will hold the patient's atenolol dosing this evening.  Use as needed  hydralazine for any blood pressure spikes and then retitrate accordingly. Check TSH, cortisol , BNP in AM. Concern for VTE/DVT is very low at this time.  Leukocytosis Minimal, check differential in the morning.  Patient does not appear to be toxic.  No fever.  I will check COVID-19 and respiratory viral panel. Check CXR  CKD (chronic kidney disease), stage III (HCC) Creatinine may be at baseline. Trending.   Home meds ordered based on podiatry note of 08/18/2022 by Dr. Flossie Dibble. Patient/pharmcy will try to collect home med list from facility. Kindly review med rect at that time. Atenolol reduced to 25 at bedtime starting tomorrow.    Advance Care Planning:   Code Status: Full Code per pateint.  Consults: none at this time.  Family Communication: per patient family aware of admission. Daughter in law at bedside, all questions answered.   Severity of Illness: The appropriate patient status for this patient is OBSERVATION. Observation status is judged to be reasonable and necessary in order to provide the required intensity of service to ensure the patient's safety. The patient's presenting symptoms, physical exam findings, and initial radiographic and laboratory data in the context of their medical condition is felt to place them at decreased risk for further clinical deterioration. Furthermore, it is anticipated that the patient will be medically stable for discharge from the hospital within 2 midnights of admission.   Author: Nolberto Hanlon, MD 01/02/2023 7:28 PM  For on call review [email or web address].

## 2023-01-02 NOTE — ED Provider Notes (Signed)
[facility] AT [facility] Provider Note   CSN: [id number] Arrival date & time: 01/02/23  1144     History  Chief Complaint  Patient presents with   Near Syncope    Amanda Curtis is a 87 y.o. female.  Patient is a 87 year old female who presents with dizziness.  She said she got up this morning and was feeling okay.  She was getting ready to go down and ate breakfast.  She lives at [facility].  She got dizzy and lightheaded.  She denies any spinning sensation.  She felt like she may pass out.  She had no chest pain or shortness of breath.  No palpitations.  She had a little nausea and some diarrhea during this episode.  No vomiting.  She did not fall or hit her head.  She feels better currently.  She denies any recent illnesses.  No cough or cold symptoms.  No fevers.  No urinary symptoms.       Home Medications Prior to Admission medications   Medication Sig Start Date End Date Taking? Authorizing Provider  ASPIRIN LOW DOSE 81 MG EC tablet Take 81 mg by mouth daily. 12/02/19   [provider]  atenolol (TENORMIN) 25 MG tablet Take 25 mg by mouth at bedtime. 12/02/19   [provider]  Evolocumab (REPATHA SURECLICK) 140 MG/ML SOAJ Inject 1 mL into the skin every 14 (fourteen) days. 06/15/20   Tonny Bollman, MD  ezetimibe (ZETIA) 10 MG tablet Take 10 mg by mouth daily. 12/02/19   [provider]  fluticasone (FLONASE) 50 MCG/ACT nasal spray Place 2 sprays into both nostrils daily as needed. 09/12/19   [provider]  gabapentin (NEURONTIN) 100 MG capsule Take 200 mg by mouth 3 (three) times daily. 12/02/19   [provider]  hydrALAZINE (APRESOLINE) 50 MG tablet Take 100 mg by mouth 3 (three) times daily. 11/07/19   [provider]  nitroGLYCERIN (NITROSTAT) 0.4 MG SL tablet Place 1 tablet (0.4 mg total) under the tongue every 5 (five) minutes as needed. 09/08/19 12/19/19  Bhagat, Sharrell Ku, PA   Omega-3 Fatty Acids (FISH OIL OMEGA-3) 1000 MG CAPS Take 1 capsule by mouth daily.    [provider]  polyethylene glycol (MIRALAX / GLYCOLAX) 17 g packet Take 17 g by mouth daily.    [provider]  SYNTHROID 112 MCG tablet Take 112 mcg by mouth daily before breakfast.  06/13/17   [provider]      Allergies    Metformin, Ace inhibitors, Amlodipine, Ampicillin, Atorvastatin, Codeine, Elavil [amitriptyline], Hctz [hydrochlorothiazide], Hytrin [terazosin], Melatonin, Trazodone, Wasp venom, and Erythromycin    Review of Systems   Review of Systems  Constitutional:  Negative for chills, diaphoresis, fatigue and fever.  HENT:  Negative for congestion, rhinorrhea and sneezing.   Eyes: Negative.   Respiratory:  Negative for cough, chest tightness and shortness of breath.   Cardiovascular:  Negative for chest pain and leg swelling.  Gastrointestinal:  Positive for diarrhea and nausea. Negative for abdominal pain, blood in stool and vomiting.  Genitourinary:  Negative for difficulty urinating, flank pain, frequency and hematuria.  Musculoskeletal:  Negative for arthralgias and back pain.  Skin:  Negative for rash.  Neurological:  Positive for syncope (Near syncope) and light-headedness. Negative for speech difficulty, weakness, numbness and headaches.    Physical Exam Updated Vital Signs BP (!) 174/85   Pulse (!) 47   Temp 98 F (36.7 C) (Oral)  Resp 20   SpO2 100%  Physical Exam Constitutional:      Appearance: She is well-developed.  HENT:     Head: Normocephalic and atraumatic.  Eyes:     Pupils: Pupils are equal, round, and reactive to light.  Cardiovascular:     Rate and Rhythm: Normal rate and regular rhythm.     Heart sounds: Normal heart sounds.  Pulmonary:     Effort: Pulmonary effort is normal. No respiratory distress.     Breath sounds: Normal breath sounds. No wheezing or rales.  Chest:     Chest wall: No tenderness.  Abdominal:      General: Bowel sounds are normal.     Palpations: Abdomen is soft.     Tenderness: There is no abdominal tenderness. There is no guarding or rebound.  Musculoskeletal:        General: Normal range of motion.     Cervical back: Normal range of motion and neck supple.  Lymphadenopathy:     Cervical: No cervical adenopathy.  Skin:    General: Skin is warm and dry.     Findings: No rash.  Neurological:     Mental Status: She is alert and oriented to person, place, and time.     Comments: Motor 5/5 all extremities Sensation grossly intact to LT all extremities Finger to Nose intact, no pronator drift CN II-XII grossly intact       ED Results / Procedures / Treatments   Labs (all labs ordered are listed, but only abnormal results are displayed) Labs Reviewed  BASIC METABOLIC PANEL - Abnormal; Notable for the following components:      Result Value   Glucose, Bld 120 (*)    BUN 26 (*)    Creatinine, Ser 1.57 (*)    GFR, Estimated 32 (*)    All other components within normal limits  CBC - Abnormal; Notable for the following components:   WBC 11.2 (*)    Platelets 414 (*)    All other components within normal limits  CBG MONITORING, ED - Abnormal; Notable for the following components:   Glucose-Capillary 112 (*)    All other components within normal limits  URINALYSIS, ROUTINE W REFLEX MICROSCOPIC    EKG EKG Interpretation Date/Time:  Tuesday January 02 2023 12:11:48 EST Ventricular Rate:  62 PR Interval:  172 QRS Duration:  74 QT Interval:  414 QTC Calculation: 420 R Axis:   -41  Text Interpretation: Normal sinus rhythm Left axis deviation Low voltage QRS T wave inversion Abnormal ECG Confirmed by Gerhard Munch [phone]) on 01/02/2023 12:21:26 PM  Radiology No results found.  Procedures Procedures    Medications Ordered in ED Medications  hydrALAZINE (APRESOLINE) tablet 100 mg (100 mg Oral Given 01/02/23 1438)    ED Course/ Medical Decision Making/ A&P                                  Medical Decision Making Amount and/or Complexity of Data Reviewed Labs: ordered.  Risk Prescription drug management.   Patient is a 87 year old female who presents after an episode of lightheadedness while she was getting ready this morning.  She currently is asymptomatic.  She says she feels weak all over but she says it does not really change from her baseline.  Her blood pressure was elevated.  She was given an oral dose of her home medication.  She does not have any symptoms  that sound more concerning for stroke.  She does not have any vertiginous symptoms.  No other neurologic deficits.  Her labs are reviewed and are nonconcerning.  Her EKG does not show any ischemic changes or arrhythmias.  Her heart rate is a little low in the 50s.  I reviewed her chart and on her last cardiology visit, her heart rate was 53.  Her creatinine is a little bit elevated but similar to prior values.  She overall feels at baseline.  She does not have any the ongoing dizziness.  She was able to ambulate to the bathroom without ataxia or significant symptoms.  Urinalysis is pending.  Patient given something to eat and drink. Care turned over to Dr. Rubin Payor following urine result and reassessment.    Final Clinical Impression(s) / ED Diagnoses Final diagnoses:  Near syncope    Rx / DC Orders ED Discharge Orders     None         Rolan Bucco, MD 01/02/23 [phone]

## 2023-01-02 NOTE — ED Triage Notes (Signed)
Pt BIB [facility] from independent living facility (the [facility]) with reports of near syncope. Pt reports while standing in the bathroom this morning she felt dizzy, lightheaded, and like she was going to pass out. No pain at this time.

## 2023-01-02 NOTE — ED Provider Notes (Signed)
  Physical Exam  BP 127/66   Pulse (!) 58   Temp 98 F (36.7 C) (Oral)   Resp 14   SpO2 95%   Physical Exam  Procedures  Procedures  ED Course / MDM    Medical Decision Making Amount and/or Complexity of Data Reviewed Labs: ordered.  Risk Prescription drug management.   Received in signout.  Dizziness lightheadedness.  Had bradycardia.  Had gone down to the 40s but has had rates down in the 50s previously.  Initial hypertensive that has improved, however patient states that she is feeling much weaker.  Nonfocal exam but has now been unable to get up and ambulate.  Also states she is unable to eat although the food is in front of her.  With episodic dizziness along with bradycardia I feel she would benefit from mission to the hospital for further monitoring.      Benjiman Core, MD 01/02/23 [phone]

## 2023-01-02 NOTE — Discharge Instructions (Signed)
Follow-up with your primary care doctor.  Return to the emergency room if you have any worsening symptoms

## 2023-01-02 NOTE — ED Notes (Signed)
Pt ambulatory to bathroom

## 2023-01-02 NOTE — Assessment & Plan Note (Addendum)
Patient reports near syncope since around 4 AM this morning that has been pretty consistent every time she gets up and ambulates.  Does not report any palpitation, associated with marked sensation of fatigue as well as some leukocytosis.  Overall clinical concern is some infectious process potentially viral.  Will check COVID-19 and respiratory viral panel as well as chest x-ray.  I will also check orthostatics, maintain the patient on telemetry.  Patient is noted to have bradycardia in the ER.  I am not sure if it has correlated with patient's symptoms however.  I will treat the patient with reduction of her dose of atenolol starting tomorrow, I will hold the patient's atenolol dosing this evening.  Use as needed hydralazine for any blood pressure spikes and then retitrate accordingly. Check TSH, cortisol , BNP in AM. Concern for VTE/DVT is very low at this time.

## 2023-01-02 NOTE — Assessment & Plan Note (Signed)
Creatinine may be at baseline. Trending.

## 2023-01-02 NOTE — ED Notes (Signed)
ED TO INPATIENT HANDOFF REPORT  ED Nurse Name and Phone #:  Theadora Rama RN [phone]  S Name/Age/Gender Margo Common 87 y.o. female Room/Bed: 024C/024C  Code Status   Code Status: Full Code  Home/SNF/Other Home Patient oriented to: self, place, time, and situation Is this baseline? Yes   Triage Complete: Triage complete  Chief Complaint Bradycardia [R00.1]  Triage Note Pt BIB [facility] from independent living facility (the [facility]) with reports of near syncope. Pt reports while standing in the bathroom this morning she felt dizzy, lightheaded, and like she was going to pass out. No pain at this time.   Allergies Allergies  Allergen Reactions   Metformin Other (See Comments)   Ace Inhibitors Cough   Amlodipine Other (See Comments)    Severe leg cramps Severe leg cramps   Ampicillin Other (See Comments)    rash   Atorvastatin Other (See Comments)    Severe muscle cramping Severe muscle cramping   Codeine Other (See Comments)    dizziness dizziness   Elavil [Amitriptyline] Other (See Comments)    Tongue swelling Tongue swelling   Hctz [Hydrochlorothiazide] Other (See Comments)    Severe leg cramps Severe leg cramps Severe leg cramps   Hytrin [Terazosin] Other (See Comments)    syncope   Melatonin Other (See Comments)    Headaches Headaches Headaches   Trazodone Other (See Comments)   Wasp Venom Other (See Comments)    Hand swelling Hand swelling   Erythromycin Rash    Level of Care/Admitting Diagnosis ED Disposition     ED Disposition  Admit   Condition  --   Comment  Hospital Area: [facility] [[id number]]  Level of Care: Telemetry Medical [104]  May admit patient to [facility] or [facility] if equivalent level of care is available:: No  Covid Evaluation: Asymptomatic - no recent exposure (last 10 days) testing not required  Diagnosis: Bradycardia [191850]  Admitting Physician: Nolberto Hanlon [[id number]]  Attending Physician: Nolberto Hanlon  [[id number]]  Certification:: I certify this patient will need inpatient services for at least 2 midnights  Expected Medical Readiness: 01/04/2023          B Medical/Surgery History Past Medical History:  Diagnosis Date   Arthritis    Diabetes mellitus without complication (HCC)    Graves disease 2000   s/p RIA   Hypertension    Insomnia    Pituitary tumor    Stroke (HCC)    Vitamin D deficiency    Past Surgical History:  Procedure Laterality Date   APPENDECTOMY     CORONARY ARTERY BYPASS GRAFT N/A 07/04/2013   Procedure: CORONARY ARTERY BYPASS GRAFTING (CABG) times four using left internal mammary artery and right saphenous leg vein.;  Surgeon: Alleen Borne, MD;  Location: [facility] OR;  Service: Open Heart Surgery;  Laterality: N/A;   INTRAOPERATIVE TRANSESOPHAGEAL ECHOCARDIOGRAM N/A 07/04/2013   Procedure: INTRAOPERATIVE TRANSESOPHAGEAL ECHOCARDIOGRAM;  Surgeon: Alleen Borne, MD;  Location: [facility] OR;  Service: Open Heart Surgery;  Laterality: N/A;   LEFT HEART CATHETERIZATION WITH CORONARY ANGIOGRAM N/A 06/30/2013   Procedure: LEFT HEART CATHETERIZATION WITH CORONARY ANGIOGRAM;  Surgeon: Micheline Chapman, MD;  Location: [facility] C[facility] LAB;  Service: Cardiovascular;  Laterality: N/A;   NASAL SEPTUM SURGERY     childhood     A IV Location/Drains/Wounds Patient Lines/Drains/Airways Status     Active Line/Drains/Airways     Name Placement date Placement time Site Days   Peripheral IV 01/02/23 20 G Anterior;Left Forearm  01/02/23  1759  Forearm  less than 1            Intake/Output Last 24 hours No intake or output data in the 24 hours ending 01/02/23 1943  Labs/Imaging Results for orders placed or performed during the hospital encounter of 01/02/23 (from the past 48 hour(s))  Basic metabolic panel     Status: Abnormal   Collection Time: 01/02/23 12:02 PM  Result Value Ref Range   Sodium 136 135 - 145 mmol/L   Potassium 4.4 3.5 - 5.1 mmol/L   Chloride 102 98 - 111 mmol/L   CO2 24  22 - 32 mmol/L   Glucose, Bld 120 (H) 70 - 99 mg/dL    Comment: Glucose reference range applies only to samples taken after fasting for at least 8 hours.   BUN 26 (H) 8 - 23 mg/dL   Creatinine, Ser 4.09 (H) 0.44 - 1.00 mg/dL   Calcium 9.3 8.9 - 81.1 mg/dL   GFR, Estimated 32 (L) >60 mL/min    Comment: (NOTE) Calculated using the CKD-EPI Creatinine Equation (2021)    Anion gap 10 5 - 15    Comment: Performed at [facility], [address], [place], [place] [address]  CBC     Status: Abnormal   Collection Time: 01/02/23 12:02 PM  Result Value Ref Range   WBC 11.2 (H) 4.0 - 10.5 K/uL   RBC 4.57 3.87 - 5.11 MIL/uL   Hemoglobin 13.1 12.0 - 15.0 g/dL   HCT 29.5 62.1 - 30.8 %   MCV 92.1 80.0 - 100.0 fL   MCH 28.7 26.0 - 34.0 pg   MCHC 31.1 30.0 - 36.0 g/dL   RDW 65.7 84.6 - 96.2 %   Platelets 414 (H) 150 - 400 K/uL   nRBC 0.0 0.0 - 0.2 %    Comment: Performed at [facility], [address] [place], [place] [address]  Urinalysis, Routine w reflex microscopic -Urine, Clean Catch     Status: Abnormal   Collection Time: 01/02/23  2:45 PM  Result Value Ref Range   Color, Urine YELLOW YELLOW   APPearance CLEAR CLEAR   Specific Gravity, Urine 1.011 1.005 - 1.030   pH 6.0 5.0 - 8.0   Glucose, UA NEGATIVE NEGATIVE mg/dL   Hgb urine dipstick NEGATIVE NEGATIVE   Bilirubin Urine NEGATIVE NEGATIVE   Ketones, ur NEGATIVE NEGATIVE mg/dL   Protein, ur NEGATIVE NEGATIVE mg/dL   Nitrite NEGATIVE NEGATIVE   Leukocytes,Ua SMALL (A) NEGATIVE   RBC / HPF 0-5 0 - 5 RBC/hpf   WBC, UA 0-5 0 - 5 WBC/hpf   Bacteria, UA RARE (A) NONE SEEN   Squamous Epithelial / HPF 0-5 0 - 5 /HPF    Comment: Performed at [facility], [address], [place], [place] [address]  CBG monitoring, ED     Status: Abnormal   Collection Time: 01/02/23  2:50 PM  Result Value Ref Range   Glucose-Capillary 112 (H) 70 - 99 mg/dL    Comment: Glucose reference range applies only to samples taken after fasting  for at least 8 hours.   No results found.  Pending Labs Unresulted Labs (From admission, onward)     Start     Ordered   01/09/23 0500  Creatinine, serum  (enoxaparin (LOVENOX)    CrCl >/= 30 ml/min)  Weekly,   R     Comments: while on enoxaparin therapy    01/02/23 1902   01/03/23  0500  APTT  Tomorrow morning,   R        01/02/23 1902   01/03/23 0500  Protime-INR  Tomorrow morning,   R        01/02/23 1902   01/03/23 0500  Basic metabolic panel  Tomorrow morning,   R        01/02/23 1902   01/03/23 0500  CBC with Differential/Platelet  Tomorrow morning,   R        01/02/23 1935   01/03/23 0500  Hepatic function panel  Tomorrow morning,   R        01/02/23 1936   01/03/23 0500  TSH  Tomorrow morning,   R        01/02/23 1940   01/03/23 0500  Brain natriuretic peptide  Tomorrow morning,   R        01/02/23 1940   01/02/23 1941  Cortisol-am, blood  Once,   R        01/02/23 1940   01/02/23 1935  SARS Coronavirus 2 by RT PCR (hospital order, performed in [facility] hospital lab) *cepheid single result test* Anterior Nasal Swab  (Tier 2 - SARS Coronavirus 2 by RT PCR (hospital order, performed in [facility] hospital lab) *cepheid single result test*)  Once,   R        01/02/23 1934   01/02/23 1935  Respiratory (~20 pathogens) panel by PCR  (Respiratory panel by PCR (~20 pathogens, ~24 hr TAT)  w precautions)  Once,   R        01/02/23 1934   01/02/23 1902  CBC  (enoxaparin (LOVENOX)    CrCl >/= 30 ml/min)  Once,   R       Comments: Baseline for enoxaparin therapy IF NOT ALREADY DRAWN.  Notify MD if PLT < 100 K.    01/02/23 1902   01/02/23 1902  Creatinine, serum  (enoxaparin (LOVENOX)    CrCl >/= 30 ml/min)  Once,   R       Comments: Baseline for enoxaparin therapy IF NOT ALREADY DRAWN.    01/02/23 1902            Vitals/Pain Today's Vitals   01/02/23 1815 01/02/23 1830 01/02/23 1845 01/02/23 1859  BP: (!) 157/80 (!) 153/76 (!) 153/82   Pulse: (!) 58 (!) 58 (!) 58    Resp: 13 15 19    Temp:    98.4 F (36.9 C)  TempSrc:      SpO2: 96% 97% 98%   PainSc:        Isolation Precautions Droplet precaution  Medications Medications  enoxaparin (LOVENOX) injection 40 mg (has no administration in time range)  acetaminophen (TYLENOL) tablet 650 mg (has no administration in time range)    Or  acetaminophen (TYLENOL) suppository 650 mg (has no administration in time range)  polyethylene glycol (MIRALAX / GLYCOLAX) packet 17 g (has no administration in time range)  sodium chloride flush (NS) 0.9 % injection 3 mL (has no administration in time range)  sodium chloride (OCEAN) 0.65 % nasal spray 1 spray (has no administration in time range)  hydrALAZINE (APRESOLINE) injection 10 mg (has no administration in time range)  hydrALAZINE (APRESOLINE) tablet 100 mg (100 mg Oral Given 01/02/23 1438)  sodium chloride 0.9 % bolus 500 mL (500 mLs Intravenous New Bag/Given 01/02/23 1759)  ibuprofen (ADVIL) tablet 200 mg (200 mg Oral Given 01/02/23 1757)    Mobility walks     Focused Assessments Cardiac  Assessment Handoff:  Cardiac Rhythm: Sinus bradycardia Lab Results  Component Value Date   CKTOTAL 66 02/23/2017   TROPONINI <0.03 11/02/2016   No results found for: "DDIMER" Does the Patient currently have chest pain? No   , Neuro Assessment Handoff:  Swallow screen pass? Yes  Cardiac Rhythm: Sinus bradycardia       Neuro Assessment:   Neuro Checks:      Has TPA been given? No If patient is a Neuro Trauma and patient is going to OR before floor call report to 4N Charge nurse: [phone] or [phone]  , Pulmonary Assessment Handoff:  Lung sounds:   O2 Device: Room Air      R Recommendations: See Admitting Provider Note  Report given to:   Additional Notes:

## 2023-01-02 NOTE — Assessment & Plan Note (Addendum)
Minimal, check differential in the morning.  Patient does not appear to be toxic.  No fever.  I will check COVID-19 and respiratory viral panel. Check CXR

## 2023-01-03 ENCOUNTER — Inpatient Hospital Stay (HOSPITAL_COMMUNITY): Payer: Medicare Other

## 2023-01-03 DIAGNOSIS — I428 Other cardiomyopathies: Secondary | ICD-10-CM

## 2023-01-03 DIAGNOSIS — R001 Bradycardia, unspecified: Secondary | ICD-10-CM | POA: Diagnosis not present

## 2023-01-03 LAB — CBC WITH DIFFERENTIAL/PLATELET
Abs Immature Granulocytes: 0.04 10*3/uL (ref 0.00–0.07)
Basophils Absolute: 0.1 10*3/uL (ref 0.0–0.1)
Basophils Relative: 1 %
Eosinophils Absolute: 0.1 10*3/uL (ref 0.0–0.5)
Eosinophils Relative: 2 %
HCT: 38.8 % (ref 36.0–46.0)
Hemoglobin: 12.4 g/dL (ref 12.0–15.0)
Immature Granulocytes: 1 %
Lymphocytes Relative: 24 %
Lymphs Abs: 2 10*3/uL (ref 0.7–4.0)
MCH: 29.5 pg (ref 26.0–34.0)
MCHC: 32 g/dL (ref 30.0–36.0)
MCV: 92.4 fL (ref 80.0–100.0)
Monocytes Absolute: 0.8 10*3/uL (ref 0.1–1.0)
Monocytes Relative: 10 %
Neutro Abs: 5.2 10*3/uL (ref 1.7–7.7)
Neutrophils Relative %: 62 %
Platelets: 389 10*3/uL (ref 150–400)
RBC: 4.2 MIL/uL (ref 3.87–5.11)
RDW: 14.9 % (ref 11.5–15.5)
WBC: 8.2 10*3/uL (ref 4.0–10.5)
nRBC: 0 % (ref 0.0–0.2)

## 2023-01-03 LAB — BASIC METABOLIC PANEL
Anion gap: 9 (ref 5–15)
BUN: 26 mg/dL — ABNORMAL HIGH (ref 8–23)
CO2: 21 mmol/L — ABNORMAL LOW (ref 22–32)
Calcium: 8.8 mg/dL — ABNORMAL LOW (ref 8.9–10.3)
Chloride: 109 mmol/L (ref 98–111)
Creatinine, Ser: 1.84 mg/dL — ABNORMAL HIGH (ref 0.44–1.00)
GFR, Estimated: 27 mL/min — ABNORMAL LOW (ref >60)
Glucose, Bld: 102 mg/dL — ABNORMAL HIGH (ref 70–99)
Potassium: 4.2 mmol/L (ref 3.5–5.1)
Sodium: 139 mmol/L (ref 135–145)

## 2023-01-03 LAB — HEPATIC FUNCTION PANEL
ALT: 11 U/L (ref 0–44)
AST: 17 U/L (ref 15–41)
Albumin: 3.3 g/dL — ABNORMAL LOW (ref 3.5–5.0)
Alkaline Phosphatase: 69 U/L (ref 38–126)
Bilirubin, Direct: <0.1 mg/dL (ref 0.0–0.2)
Total Bilirubin: 0.7 mg/dL (ref <1.2)
Total Protein: 6.3 g/dL — ABNORMAL LOW (ref 6.5–8.1)

## 2023-01-03 LAB — ECHOCARDIOGRAM COMPLETE
Area-P 1/2: 2.62 cm2
Calc EF: 66.3 %
Height: 66 in
MV VTI: 2.14 cm2
S' Lateral: 1.9 cm
Single Plane A2C EF: 65.9 %
Single Plane A4C EF: 65.9 %
Weight: 2553.81 [oz_av]

## 2023-01-03 LAB — T4, FREE: Free T4: 0.49 ng/dL — ABNORMAL LOW (ref 0.61–1.12)

## 2023-01-03 LAB — PROTIME-INR
INR: 1 (ref 0.8–1.2)
Prothrombin Time: 13.2 s (ref 11.4–15.2)

## 2023-01-03 LAB — GLUCOSE, CAPILLARY
Glucose-Capillary: 113 mg/dL — ABNORMAL HIGH (ref 70–99)
Glucose-Capillary: 122 mg/dL — ABNORMAL HIGH (ref 70–99)
Glucose-Capillary: 126 mg/dL — ABNORMAL HIGH (ref 70–99)

## 2023-01-03 LAB — APTT: aPTT: 32 s (ref 24–36)

## 2023-01-03 LAB — HEMOGLOBIN A1C
Hgb A1c MFr Bld: 6.6 % — ABNORMAL HIGH (ref 4.8–5.6)
Mean Plasma Glucose: 142.72 mg/dL

## 2023-01-03 LAB — TSH: TSH: 34.037 u[IU]/mL — ABNORMAL HIGH (ref 0.350–4.500)

## 2023-01-03 LAB — BRAIN NATRIURETIC PEPTIDE: B Natriuretic Peptide: 98.5 pg/mL (ref 0.0–100.0)

## 2023-01-03 MED ORDER — IPRATROPIUM-ALBUTEROL 0.5-2.5 (3) MG/3ML IN SOLN: 3.0000 mL | RESPIRATORY_TRACT | Status: DC | PRN

## 2023-01-03 MED ORDER — DICLOFENAC SODIUM 1 % EX GEL
2.0000 g | Freq: Four times a day (QID) | CUTANEOUS | Status: DC | PRN
  Administered 2023-01-03: 2 g via TOPICAL
  Filled 2023-01-03: qty 100

## 2023-01-03 MED ORDER — METOPROLOL TARTRATE 5 MG/5ML IV SOLN: 5.0000 mg | INTRAVENOUS | Status: DC | PRN

## 2023-01-03 MED ORDER — EZETIMIBE 10 MG PO TABS
10.0000 mg | ORAL_TABLET | Freq: Every day | ORAL | Status: DC
  Administered 2023-01-03 – 2023-01-04 (×2): 10 mg via ORAL
  Filled 2023-01-03 (×2): qty 1

## 2023-01-03 MED ORDER — ASPIRIN 81 MG PO TBEC
81.0000 mg | DELAYED_RELEASE_TABLET | Freq: Every day | ORAL | Status: DC
  Administered 2023-01-03 – 2023-01-04 (×2): 81 mg via ORAL
  Filled 2023-01-03 (×2): qty 1

## 2023-01-03 MED ORDER — LEVOTHYROXINE SODIUM 75 MCG PO TABS
150.0000 ug | ORAL_TABLET | Freq: Every day | ORAL | Status: DC
Start: 2023-01-04 — End: 2023-01-04
  Administered 2023-01-04: 150 ug via ORAL
  Filled 2023-01-03: qty 2

## 2023-01-03 MED ORDER — SENNOSIDES-DOCUSATE SODIUM 8.6-50 MG PO TABS: 1.0000 | ORAL_TABLET | Freq: Every evening | ORAL | Status: DC | PRN

## 2023-01-03 MED ORDER — ONDANSETRON HCL 4 MG/2ML IJ SOLN
4.0000 mg | Freq: Four times a day (QID) | INTRAMUSCULAR | Status: DC | PRN
  Administered 2023-01-04: 4 mg via INTRAVENOUS
  Filled 2023-01-03: qty 2

## 2023-01-03 MED ORDER — GUAIFENESIN 100 MG/5ML PO LIQD: 5.0000 mL | ORAL | Status: DC | PRN

## 2023-01-03 MED ORDER — HYDRALAZINE HCL 20 MG/ML IJ SOLN: 10.0000 mg | INTRAMUSCULAR | Status: DC | PRN

## 2023-01-03 MED ORDER — SODIUM CHLORIDE 0.9 % IV SOLN: INTRAVENOUS | Status: AC

## 2023-01-03 MED ORDER — INSULIN ASPART 100 UNIT/ML IJ SOLN
0.0000 [IU] | Freq: Three times a day (TID) | INTRAMUSCULAR | Status: DC
Start: 2023-01-03 — End: 2023-01-04

## 2023-01-03 NOTE — Evaluation (Signed)
Occupational Therapy Evaluation Patient Details Name: KAMILYA WAKEMAN MRN: [id number] DOB: 1935-07-04 Today's Date: 01/03/2023   History of Present Illness 87 year old female who presents with dizziness. PMH significant of chronic knee arthritic osteoarthritis, DM, Graves disease s/p RIA, HTN, insomnia, pituitary tumor, stroke, Vit D deficiency.   Clinical Impression   Pt feels a little weak, overall doing well, no pain, no dizziness today. Pt lives at ILF, ind with ADLs and mobility with Rollator, goes to cafeteria for meals, has help with cleaning on occasion. Pt currently close to baseline, slight decreased activity tolerance, good overall balance, able to stand unsupported at sink as needed to complete grooming, supervision for safety with mobility.  Pt would benefit from acute OT to maximize activity tolerance prior to return home, no OT follow up needed.      If plan is discharge home, recommend the following: A little help with walking and/or transfers;A little help with bathing/dressing/bathroom;Assistance with cooking/housework;Assist for transportation    Functional Status Assessment  Patient has had a recent decline in their functional status and demonstrates the ability to make significant improvements in function in a reasonable and predictable amount of time.  Equipment Recommendations  None recommended by OT    Recommendations for Other Services       Precautions / Restrictions Precautions Precautions: Fall Restrictions Weight Bearing Restrictions: No      Mobility Bed Mobility               General bed mobility comments: arrived in recliner    Transfers Overall transfer level: Needs assistance Equipment used: Rolling walker (2 wheels) Transfers: Sit to/from Stand, Bed to chair/wheelchair/BSC Sit to Stand: Supervision           General transfer comment: supervision for safety      Balance Overall balance assessment: Needs assistance   Sitting  balance-Leahy Scale: Good       Standing balance-Leahy Scale: Good                             ADL either performed or assessed with clinical judgement   ADL Overall ADL's : Needs assistance/impaired Eating/Feeding: Independent   Grooming: Supervision/safety;Standing   Upper Body Bathing: Supervision/ safety;Sitting   Lower Body Bathing: Supervison/ safety;Sitting/lateral leans   Upper Body Dressing : Set up;Sitting   Lower Body Dressing: Supervision/safety;Set up   Toilet Transfer: Supervision/safety   Toileting- Clothing Manipulation and Hygiene: Modified independent   Tub/ Shower Transfer: Supervision/safety;Shower seat;Ambulation;Rollator (4 wheels)   Functional mobility during ADLs: Supervision/safety;Rolling walker (2 wheels) General ADL Comments: Pt close to baseline, supervision for safety due to recent dizziness.     Vision Baseline Vision/History: 4 Cataracts Ability to See in Adequate Light: 1 Impaired Patient Visual Report: No change from baseline       Perception         Praxis         Pertinent Vitals/Pain Pain Assessment Pain Assessment: No/denies pain     Extremity/Trunk Assessment Upper Extremity Assessment Upper Extremity Assessment: Overall WFL for tasks assessed           Communication Communication Communication: No apparent difficulties   Cognition Arousal: Alert Behavior During Therapy: WFL for tasks assessed/performed Overall Cognitive Status: Within Functional Limits for tasks assessed  General Comments       Exercises     Shoulder Instructions      Home Living Family/patient expects to be discharged to:: Private residence Living Arrangements: Alone   Type of Home: Independent living facility Home Access: Elevator     Home Layout: One level     Bathroom Shower/Tub: Runner, broadcasting/film/video: Rollator (4 wheels);Shower seat;Cane  - single point;Grab bars - tub/shower   Additional Comments: Pt lives alone at ILF, aide helps with cleaning, goes to cafeteria for meals mainly.      Prior Functioning/Environment Prior Level of Function : Independent/Modified Independent             Mobility Comments: uses rollator. ADLs Comments: staff helps with medication, cleans, meals prepared in cafeteria. Able to complete ADLs ind.        OT Problem List: Decreased activity tolerance      OT Treatment/Interventions: Self-care/ADL training;Therapeutic exercise;Energy conservation;Therapeutic activities;Patient/family education    OT Goals(Current goals can be found in the care plan section) Acute Rehab OT Goals Patient Stated Goal: To return home OT Goal Formulation: With patient Time For Goal Achievement: 01/17/23 Potential to Achieve Goals: Good  OT Frequency: Min 1X/week    Co-evaluation              AM-PAC OT "6 Clicks" Daily Activity     Outcome Measure Help from another person eating meals?: None Help from another person taking care of personal grooming?: A Little Help from another person toileting, which includes using toliet, bedpan, or urinal?: None Help from another person bathing (including washing, rinsing, drying)?: A Little Help from another person to put on and taking off regular upper body clothing?: A Little Help from another person to put on and taking off regular lower body clothing?: A Little 6 Click Score: 20   End of Session Equipment Utilized During Treatment: Gait belt;Rolling walker (2 wheels) Nurse Communication: Mobility status  Activity Tolerance: Patient tolerated treatment well Patient left: in chair;with call bell/phone within reach  OT Visit Diagnosis: Muscle weakness (generalized) (M62.81)                Time: 9604-5409 OT Time Calculation (min): 28 min Charges:  OT General Charges $OT Visit: 1 Visit OT Evaluation $OT Eval Low Complexity: 1 Low OT Treatments $Self  Care/Home Management : 8-22 mins  Giang Hemme, OTR/L   Alexis Goodell 01/03/2023, 11:59 AM

## 2023-01-03 NOTE — Plan of Care (Signed)

## 2023-01-03 NOTE — Evaluation (Signed)
Physical Therapy Evaluation Patient Details Name: Amanda Curtis MRN: [id number] DOB: 1936-02-03 Today's Date: 01/03/2023  History of Present Illness  87 year old female who presents with dizziness. PMH significant of chronic knee arthritic osteoarthritis, DM, Graves disease s/p RIA, HTN, insomnia, pituitary tumor, stroke, Vit D deficiency.  Clinical Impression  Pt admitted with above diagnosis and presents to PT with functional limitations due to deficits listed below (See PT problem list). Pt needs skilled PT to maximize independence and safety. Pt likely not too far from recent baseline and can benefit from functional mobility training, strengthening, and balance training.           If plan is discharge home, recommend the following: Direct supervision/assist for medications management;Assistance with cooking/housework;Assist for transportation   Can travel by private vehicle        Equipment Recommendations None recommended by PT  Recommendations for Other Services       Functional Status Assessment Patient has had a recent decline in their functional status and demonstrates the ability to make significant improvements in function in a reasonable and predictable amount of time.     Precautions / Restrictions Precautions Precautions: Fall Restrictions Weight Bearing Restrictions: No      Mobility  Bed Mobility Overal bed mobility: Needs Assistance Bed Mobility: Supine to Sit     Supine to sit: Supervision     General bed mobility comments: Supervision for lines/tubes    Transfers Overall transfer level: Needs assistance Equipment used: Rollator (4 wheels) Transfers: Sit to/from Stand Sit to Stand: Supervision           General transfer comment: supervision for safety    Ambulation/Gait Ambulation/Gait assistance: Supervision Gait Distance (Feet): 175 Feet Assistive device: Rollator (4 wheels) Gait Pattern/deviations: Step-through pattern, Decreased  stride length, Trunk flexed Gait velocity: decr Gait velocity interpretation: 1.31 - 2.62 ft/sec, indicative of limited community ambulator   General Gait Details: Assist for safety  Stairs            Wheelchair Mobility     Tilt Bed    Modified Rankin (Stroke Patients Only)       Balance Overall balance assessment: Needs assistance Sitting-balance support: No upper extremity supported, Feet supported Sitting balance-Leahy Scale: Good     Standing balance support: No upper extremity supported, During functional activity Standing balance-Leahy Scale: Fair                               Pertinent Vitals/Pain      Home Living Family/patient expects to be discharged to:: Private residence Living Arrangements: Alone   Type of Home: Independent living facility Home Access: Elevator       Home Layout: One level Home Equipment: Rollator (4 wheels);Shower seat;Cane - single point;Grab bars - tub/shower Additional Comments: Pt lives alone at ILF, aide helps with cleaning, goes to cafeteria for meals mainly.    Prior Function Prior Level of Function : Independent/Modified Independent             Mobility Comments: uses rollator. ADLs Comments: staff helps with medication, cleans, meals prepared in cafeteria. Able to complete ADLs ind.     Extremity/Trunk Assessment   Upper Extremity Assessment Upper Extremity Assessment: Defer to OT evaluation    Lower Extremity Assessment Lower Extremity Assessment: Generalized weakness       Communication   Communication Communication: No apparent difficulties  Cognition Arousal: Alert Behavior During Therapy: [facility] for tasks assessed/performed  Overall Cognitive Status: No family/caregiver present to determine baseline cognitive functioning                                 General Comments: Mild memory deficits noted        General Comments      Exercises     Assessment/Plan     PT Assessment Patient needs continued PT services  PT Problem List Decreased strength;Decreased balance;Decreased mobility       PT Treatment Interventions DME instruction;Gait training;Functional mobility training;Therapeutic activities;Therapeutic exercise;Balance training;Patient/family education    PT Goals (Current goals can be found in the Care Plan section)  Acute Rehab PT Goals Patient Stated Goal: return to apt PT Goal Formulation: With patient Time For Goal Achievement: 01/17/23 Potential to Achieve Goals: Good    Frequency Min 1X/week     Co-evaluation               AM-PAC PT "6 Clicks" Mobility  Outcome Measure Help needed turning from your back to your side while in a flat bed without using bedrails?: None Help needed moving from lying on your back to sitting on the side of a flat bed without using bedrails?: None Help needed moving to and from a bed to a chair (including a wheelchair)?: A Little Help needed standing up from a chair using your arms (e.g., wheelchair or bedside chair)?: A Little Help needed to walk in hospital room?: A Little Help needed climbing 3-5 steps with a railing? : A Lot 6 Click Score: 19    End of Session   Activity Tolerance: Patient tolerated treatment well Patient left: in chair;with call bell/phone within reach Nurse Communication: Mobility status PT Visit Diagnosis: Unsteadiness on feet (R26.81);Muscle weakness (generalized) (M62.81);Other abnormalities of gait and mobility (R26.89)    Time: 1610-9604 PT Time Calculation (min) (ACUTE ONLY): 16 min   Charges:   PT Evaluation $PT Eval Low Complexity: 1 Low   PT General Charges $$ ACUTE PT VISIT: 1 Visit         [facility] PT Acute Rehabilitation Services Office [phone]   Angelina Ok [facility] 01/03/2023, 4:40 PM

## 2023-01-03 NOTE — Progress Notes (Signed)
  Echocardiogram 2D Echocardiogram has been performed.  Milda Smart 01/03/2023, 3:14 PM

## 2023-01-03 NOTE — Plan of Care (Signed)

## 2023-01-03 NOTE — Progress Notes (Signed)
PROGRESS NOTE    Amanda Curtis  ZDG:[id number] DOB: [birth year] DOA: 01/02/2023 PCP: Ralene Ok, MD   Brief Narrative:  82M with history of chronic knee arthritis limiting ambulatory capacity, CVA, CAD status post bypass, DM2, HLD, hypothyroidism, CKD stage III A, pituitary macroadenoma poor historian admitting of generalized weakness and having episodes of near syncope at home.  In the ER noted to have sinus bradycardia with heart rate in 50s.  Admitted for further workup.   Assessment & Plan:  Active Problems:   CKD (chronic kidney disease), stage III (HCC)   Leukocytosis   Near syncope   Near syncope Etiology is currently unclear.  Ongoing metabolic workup.  Cortisol, BNP, troponins are normal.  Elevated TSH, low free T4.  Echo, orthostatics.  No obvious concerns of infection at this time.  Holding off on atenolol  Hypothyroidism - Elevated TSH, low free T4.  Will increase Synthroid. Recheck TSH/ft4 in 4 weeks   History of CAD status post bypass -Continue Plavix and zetia.   Diabetes mellitus type 2 -Diet controlled?.  Placed on sliding scale.  Hyperlipidemia -Continue Zetia   Leukocytosis Resolved   AKI on CKD (chronic kidney disease), stage IIIa (HCC) Baseline Cr 1.5, trending up. Will give gentle hydration.   PT/OT = full eval pending.    DVT prophylaxis: lovenox Code Status: Full Family Communication:   Status is: Inpatient Remains inpatient appropriate because: On going eval for syncope.     Subjective: Still weak.    Examination:  General exam: Appears calm and comfortable ; elderly frail.  Respiratory system: Clear to auscultation. Respiratory effort normal. Cardiovascular system: S1 & S2 heard, RRR. No JVD, murmurs, rubs, gallops or clicks. No pedal edema. Gastrointestinal system: Abdomen is nondistended, soft and nontender. No organomegaly or masses felt. Normal bowel sounds heard. Central nervous system: Alert and oriented. No focal  neurological deficits. Extremities: Symmetric 5 x 5 power. Skin: No rashes, lesions or ulcers Psychiatry: Judgement and insight appear normal. Mood & affect appropriate.      Diet Orders (From admission, onward)     Start     Ordered   01/02/23 1902  Diet regular Room service appropriate? Yes; Fluid consistency: Thin  Diet effective now       Question Answer Comment  Room service appropriate? Yes   Fluid consistency: Thin      01/02/23 1902            Objective: Vitals:   01/02/23 2145 01/03/23 0020 01/03/23 0425 01/03/23 0737  BP: 127/62 114/66 139/76 (!) 146/78  Pulse:  62 (!) 51 (!) 58  Resp:  18 18 19   Temp:  97.9 F (36.6 C) 98.1 F (36.7 C) 97.8 F (36.6 C)  TempSrc:  Oral Oral Oral  SpO2:  98% 96% 98%  Weight:  72.4 kg    Height:        Intake/Output Summary (Last 24 hours) at 01/03/2023 0826 Last data filed at 01/03/2023 0816 Gross per 24 hour  Intake 863 ml  Output 300 ml  Net 563 ml   Filed Weights   01/02/23 2045 01/03/23 0020  Weight: 71.7 kg 72.4 kg    Scheduled Meds:  atenolol  25 mg Oral QHS   clopidogrel  75 mg Oral Daily   enoxaparin (LOVENOX) injection  30 mg Subcutaneous Q24H   hydrALAZINE  25 mg Oral BID   irbesartan  75 mg Oral Daily   levothyroxine  112 mcg Oral Q0600   sodium chloride flush  3 mL Intravenous Q12H   Continuous Infusions:  Nutritional status     Body mass index is 25.76 kg/m.  Data Reviewed:   CBC: Recent Labs  Lab 01/02/23 1202 01/02/23 2120 01/03/23 0324  WBC 11.2* 10.2 8.2  NEUTROABS  --   --  5.2  HGB 13.1 12.9 12.4  HCT 42.1 40.1 38.8  MCV 92.1 91.1 92.4  PLT 414* 378 389   Basic Metabolic Panel: Recent Labs  Lab 01/02/23 1202 01/02/23 2120 01/03/23 0325  NA 136  --  139  K 4.4  --  4.2  CL 102  --  109  CO2 24  --  21*  GLUCOSE 120*  --  102*  BUN 26*  --  26*  CREATININE 1.57* 1.65* 1.84*  CALCIUM 9.3  --  8.8*   GFR: Estimated Creatinine Clearance: 22.8 mL/min (A) (by C-G  formula based on SCr of 1.84 mg/dL (H)). Liver Function Tests: Recent Labs  Lab 01/03/23 0325  AST 17  ALT 11  ALKPHOS 69  BILITOT 0.7  PROT 6.3*  ALBUMIN 3.3*   No results for input(s): "LIPASE", "AMYLASE" in the last 168 hours. No results for input(s): "AMMONIA" in the last 168 hours. Coagulation Profile: Recent Labs  Lab 01/03/23 0325  INR 1.0   Cardiac Enzymes: No results for input(s): "CKTOTAL", "CKMB", "CKMBINDEX", "TROPONINI" in the last 168 hours. BNP (last 3 results) No results for input(s): "PROBNP" in the last 8760 hours. HbA1C: No results for input(s): "HGBA1C" in the last 72 hours. CBG: Recent Labs  Lab 01/02/23 1450  GLUCAP 112*   Lipid Profile: No results for input(s): "CHOL", "HDL", "LDLCALC", "TRIG", "CHOLHDL", "LDLDIRECT" in the last 72 hours. Thyroid Function Tests: Recent Labs    01/03/23 0325  TSH 34.037*   Anemia Panel: No results for input(s): "VITAMINB12", "FOLATE", "FERRITIN", "TIBC", "IRON", "RETICCTPCT" in the last 72 hours. Sepsis Labs: No results for input(s): "PROCALCITON", "LATICACIDVEN" in the last 168 hours.  Recent Results (from the past 240 hour(s))  SARS Coronavirus 2 by RT PCR (hospital order, performed in [facility] hospital lab) *cepheid single result test* Anterior Nasal Swab     Status: None   Collection Time: 01/02/23  7:51 PM   Specimen: Anterior Nasal Swab  Result Value Ref Range Status   SARS Coronavirus 2 by RT PCR NEGATIVE NEGATIVE Final    Comment: Performed at [facility], [address], [place], [place] [address]  Respiratory (~20 pathogens) panel by PCR     Status: None   Collection Time: 01/02/23  7:51 PM   Specimen: Nasopharyngeal Swab; Respiratory  Result Value Ref Range Status   Adenovirus NOT DETECTED NOT DETECTED Final   Coronavirus 229E NOT DETECTED NOT DETECTED Final    Comment: (NOTE) The Coronavirus on the Respiratory Panel, DOES NOT test for the novel  Coronavirus (2019 nCoV)     Coronavirus HKU1 NOT DETECTED NOT DETECTED Final   Coronavirus NL63 NOT DETECTED NOT DETECTED Final   Coronavirus OC43 NOT DETECTED NOT DETECTED Final   Metapneumovirus NOT DETECTED NOT DETECTED Final   Rhinovirus / Enterovirus NOT DETECTED NOT DETECTED Final   Influenza A NOT DETECTED NOT DETECTED Final   Influenza B NOT DETECTED NOT DETECTED Final   Parainfluenza Virus 1 NOT DETECTED NOT DETECTED Final   Parainfluenza Virus 2 NOT DETECTED NOT DETECTED Final   Parainfluenza Virus 3 NOT DETECTED NOT DETECTED Final   Parainfluenza Virus 4 NOT DETECTED NOT DETECTED Final   Respiratory  Syncytial Virus NOT DETECTED NOT DETECTED Final   Bordetella pertussis NOT DETECTED NOT DETECTED Final   Bordetella Parapertussis NOT DETECTED NOT DETECTED Final   Chlamydophila pneumoniae NOT DETECTED NOT DETECTED Final   Mycoplasma pneumoniae NOT DETECTED NOT DETECTED Final    Comment: Performed at [facility], [address] [place], [place] [address]         Radiology Studies: DG CHEST PORT 1 VIEW  Result Date: 01/02/2023 CLINICAL DATA:  Pneumonia, near syncope. EXAM: PORTABLE CHEST 1 VIEW COMPARISON:  10/13/2019. FINDINGS: The heart size and mediastinal contours are within normal limits. There is atherosclerotic calcification of the aorta. Stable atelectasis or scarring is noted at the left lung base. No consolidation, effusion, or pneumothorax. Sternotomy wires are present over the midline. No acute osseous abnormality. IMPRESSION: No active disease. Electronically Signed   By: Thornell Sartorius M.D.   On: 01/02/2023 21:25           LOS: 1 day   Time spent= 35 mins    Miguel Rota, MD [facility]  If 7PM-7AM, please contact night-coverage  01/03/2023, 8:26 AM

## 2023-01-03 NOTE — Progress Notes (Signed)
Mobility Specialist Progress Note:   01/03/23 1100  Orthostatic Lying   BP- Lying  (pt sitting in chair)  Orthostatic Sitting  BP- Sitting 109/63 (MAP 78)  Orthostatic Standing at 0 minutes  BP- Standing at 0 minutes 105/66 (MAP 76)  Orthostatic Standing at 3 minutes  BP- Standing at 3 minutes 124/90 (MAP 98)  Mobility  Activity Stood at bedside  Level of Assistance Standby assist, set-up cues, supervision of patient - no hands on  Assistive Device Front wheel walker  Activity Response Tolerated well  Mobility Referral Yes  $Mobility charge 1 Mobility  Mobility Specialist Start Time (ACUTE ONLY) 1100  Mobility Specialist Stop Time (ACUTE ONLY) 1112  Mobility Specialist Time Calculation (min) (ACUTE ONLY) 12 min   Pt agreeable to mobility session. Took orthostatics which were negative. Further mobility deferred d/t other staff in room. Left sitting in chair with all needs met.   Addison Lank Mobility Specialist Please contact via SecureChat or  Rehab office at [phone]

## 2023-01-03 NOTE — TOC Initial Note (Signed)
Transition of Care [facility]) - Initial/Assessment Note    Patient Details  Name: Amanda Curtis MRN: [id number] Date of Birth: [birth year]  Transition of Care [facility]) CM/SW Contact:    Leone Haven, RN Phone Number: 01/03/2023, 12:18 PM  Clinical Narrative:                 From [facility] in [place], has PCP and insurance on file, states has an aide that helps her with her medications twice a day.   Has a rollator at home.  States  DIL or friend will transport her  home at dc and they are her  support system, states gets medications from [facility] she thinks, but she can not remember.  Pta self ambulatory  with rollator.   Expected Discharge Plan: Home/Self Care ([facility] in [place]) Barriers to Discharge: Continued Medical Work up   Patient Goals and CMS Choice Patient states their goals for this hospitalization and ongoing recovery are:: retur to IDL   Choice offered to / list presented to : NA      Expected Discharge Plan and Services In-house Referral: NA Discharge Planning Services: CM Consult Post Acute Care Choice: NA Living arrangements for the past 2 months: Independent Living Facility                 DME Arranged: N/A DME Agency: NA       HH Arranged: NA          Prior Living Arrangements/Services Living arrangements for the past 2 months: Independent Living Facility Lives with:: Self Patient language and need for interpreter reviewed:: Yes Do you feel safe going back to the place where you live?: Yes      Need for Family Participation in Patient Care: Yes (Comment) Care giver support system in place?: Yes (comment) Current home services: DME (Rollator) Criminal Activity/Legal Involvement Pertinent to Current Situation/Hospitalization: No - Comment as needed  Activities of Daily Living      Permission Sought/Granted Permission sought to share information with : Case Manager Permission granted to share information with : Yes,  Verbal Permission Granted              Emotional Assessment Appearance:: Appears stated age Attitude/Demeanor/Rapport: Engaged Affect (typically observed): Appropriate Orientation: : Oriented to Self, Oriented to Place, Oriented to  Time, Oriented to Situation Alcohol / Substance Use: Not Applicable Psych Involvement: No (comment)  Admission diagnosis:  Bradycardia [R00.1] Near syncope [R55] Patient Active Problem List   Diagnosis Date Noted   Leukocytosis 01/02/2023   Near syncope 01/02/2023   Myalgia due to statin 01/07/2020   CKD (chronic kidney disease), stage III (HCC) 02/23/2017   Type II diabetes mellitus with renal manifestations (HCC) 02/23/2017   Stroke (cerebrum) (HCC) 02/22/2017   Dizziness and giddiness 11/01/2016   Hypertensive urgency 11/01/2016   Pituitary macroadenoma (HCC) 11/01/2016   Vitamin D deficiency 10/27/2015   Vulvar atrophy 02/03/2015   Allergy to bee sting 11/30/2014   Dysuria 12/23/2013   Diabetic peripheral neuropathy (HCC) 09/22/2013   Carotid stenosis 09/08/2013   Hyperlipidemia 07/25/2013   Type 2 diabetes with peripheral circulatory disorder, controlled (HCC) 07/12/2013   Constipation 07/12/2013   S/P CABG x 4 07/04/2013   Coronary artery disease 05/23/2013   Anxiety 04/30/2013   Routine general medical examination at a health care facility 03/25/2013   Nonspecific abnormal electrocardiogram (ECG) (EKG) 03/25/2013   Essential hypertension 02/07/2013   Insomnia 02/07/2013   Hypothyroidism 02/07/2013   PCP:  Ralene Ok, MD Pharmacy:   [facility] [phone] - [place], [place] - [address] [place] [address] Phone: [phone] Fax: [phone]  [facility] [address] - [place], [place] - [address], [address] [place] [address] Phone: [phone] Fax: [phone]     Social Determinants of Health  (SDOH) Social History: SDOH Screenings   Tobacco Use: Low Risk  (01/02/2023)   SDOH Interventions:     Readmission Risk Interventions     No data to display

## 2023-01-04 ENCOUNTER — Other Ambulatory Visit (HOSPITAL_COMMUNITY): Payer: Self-pay

## 2023-01-04 DIAGNOSIS — R001 Bradycardia, unspecified: Secondary | ICD-10-CM | POA: Diagnosis not present

## 2023-01-04 LAB — BASIC METABOLIC PANEL
Anion gap: 6 (ref 5–15)
BUN: 24 mg/dL — ABNORMAL HIGH (ref 8–23)
CO2: 21 mmol/L — ABNORMAL LOW (ref 22–32)
Calcium: 8.3 mg/dL — ABNORMAL LOW (ref 8.9–10.3)
Chloride: 111 mmol/L (ref 98–111)
Creatinine, Ser: 1.63 mg/dL — ABNORMAL HIGH (ref 0.44–1.00)
GFR, Estimated: 31 mL/min — ABNORMAL LOW (ref >60)
Glucose, Bld: 123 mg/dL — ABNORMAL HIGH (ref 70–99)
Potassium: 4.4 mmol/L (ref 3.5–5.1)
Sodium: 138 mmol/L (ref 135–145)

## 2023-01-04 LAB — CBC
HCT: 36.4 % (ref 36.0–46.0)
Hemoglobin: 11.3 g/dL — ABNORMAL LOW (ref 12.0–15.0)
MCH: 29.1 pg (ref 26.0–34.0)
MCHC: 31 g/dL (ref 30.0–36.0)
MCV: 93.8 fL (ref 80.0–100.0)
Platelets: 359 10*3/uL (ref 150–400)
RBC: 3.88 MIL/uL (ref 3.87–5.11)
RDW: 15.3 % (ref 11.5–15.5)
WBC: 7 10*3/uL (ref 4.0–10.5)
nRBC: 0 % (ref 0.0–0.2)

## 2023-01-04 LAB — MAGNESIUM: Magnesium: 2.3 mg/dL (ref 1.7–2.4)

## 2023-01-04 LAB — PHOSPHORUS: Phosphorus: 3.7 mg/dL (ref 2.5–4.6)

## 2023-01-04 LAB — GLUCOSE, CAPILLARY: Glucose-Capillary: 105 mg/dL — ABNORMAL HIGH (ref 70–99)

## 2023-01-04 MED ORDER — LEVOTHYROXINE SODIUM 150 MCG PO TABS
150.0000 ug | ORAL_TABLET | Freq: Every day | ORAL | 0 refills | Status: DC
  Filled 2023-01-04: qty 30, 30d supply, fill #0

## 2023-01-04 NOTE — Hospital Course (Addendum)
  Brief Narrative:  42M with history of chronic knee arthritis limiting ambulatory capacity, CVA, CAD status post bypass, DM2, HLD, hypothyroidism, CKD stage III A, pituitary macroadenoma poor historian admitting of generalized weakness and having episodes of near syncope at home.  In the ER noted to have sinus bradycardia with heart rate in 50s.  Admitted for further workup.   Elevated TSH, low free T4.  Echo shows preserved EF 65%.  No obvious concerns of infection.  Atenolol on hold due to sinus bradycardia. Elevated TSH, low free T4.  Synthroid increased . Recheck TSH/ft4 in 2 weeks with PCP. PT/OT = Home health, face-to-face completed  DC her today     Assessment & Plan:  Active Problems:   CKD (chronic kidney disease), stage III (HCC)   Leukocytosis   Near syncope   Near syncope Etiology is currently unclear.  Ongoing metabolic workup.  Cortisol, BNP, troponins are normal.  Elevated TSH, low free T4.  Echo shows preserved EF 65%.  No obvious concerns of infection.  Atenolol on hold due to sinus bradycardia   Hypothyroidism - Elevated TSH, low free T4.  Synthroid increased . Recheck TSH/ft4 in 2 weeks with PCP   History of CAD status post bypass -Continue aspirin, Plavix and zetia.  Atenolol on hold due to bradycardia   Diabetes mellitus type 2 -Diet controlled?.  Placed on sliding scale.   Hyperlipidemia -Continue Zetia   Leukocytosis Resolved   AKI on CKD (chronic kidney disease), stage IIIa (HCC) Baseline Cr 1.5, peaked 1.84, slowly improving.  Encourage oral hydration.   PT/OT = Home health, face-to-face completed     DVT prophylaxis: lovenox Code Status: Full Family Communication:   Status is: Inpatient Remains inpatient appropriate because: Hopefully we can discharge the patient home later today       Subjective: Doing ok wants to go home     Examination:   General exam: Appears calm and comfortable ; elderly frail.  Respiratory system: Clear  to auscultation. Respiratory effort normal. Cardiovascular system: S1 & S2 heard, RRR. No JVD, murmurs, rubs, gallops or clicks. No pedal edema. Gastrointestinal system: Abdomen is nondistended, soft and nontender. No organomegaly or masses felt. Normal bowel sounds heard. Central nervous system: Alert and oriented. No focal neurological deficits. Extremities: Symmetric 5 x 5 power. Skin: No rashes, lesions or ulcers Psychiatry: Judgement and insight appear normal. Mood & affect appropriate.

## 2023-01-04 NOTE — Progress Notes (Signed)
Mobility Specialist Progress Note:    01/04/23 1144  Mobility  Activity Ambulated with assistance in hallway  Level of Assistance Contact guard assist, steadying assist  Assistive Device Four wheel walker  Distance Ambulated (ft) 150 ft  Activity Response Tolerated well  Mobility Referral Yes  $Mobility charge 1 Mobility  Mobility Specialist Start Time (ACUTE ONLY) 1000  Mobility Specialist Stop Time (ACUTE ONLY) 1016  Mobility Specialist Time Calculation (min) (ACUTE ONLY) 16 min   Received pt in bed agreeable to mobility. C/o mild pain in her R knee during ambulation, otherwise no c/o.Returned to room w/o fault. Left in bed w/ call bell in reach and all needs met.   Thompson Grayer Mobility Specialist  Please contact vis Secure Chat or  Rehab Office [phone]

## 2023-01-04 NOTE — Discharge Summary (Signed)
Physician Discharge Summary  Amanda Curtis VWU:[id number] DOB: [birth year] DOA: 01/02/2023  PCP: Ralene Ok, MD  Admit date: 01/02/2023 Discharge date: 01/04/2023  Admitted From: Independent living Disposition:  Independent living  Recommendations for Outpatient Follow-up:  Follow up with PCP in 1-2 weeks Please obtain BMP/CBC in one week your next doctors visit.  Increased Synthroid, recheck TFTs in 2-3 weeks Atenolol stopped due to bradycardia.    Discharge Condition: Stable CODE STATUS: Full Diet recommendation: Diabetic  Brief/Interim Summary:  Brief Narrative:  36M with history of chronic knee arthritis limiting ambulatory capacity, CVA, CAD status post bypass, DM2, HLD, hypothyroidism, CKD stage III A, pituitary macroadenoma poor historian admitting of generalized weakness and having episodes of near syncope at home.  In the ER noted to have sinus bradycardia with heart rate in 50s.  Admitted for further workup.   Elevated TSH, low free T4.  Echo shows preserved EF 65%.  No obvious concerns of infection.  Atenolol on hold due to sinus bradycardia. Elevated TSH, low free T4.  Synthroid increased . Recheck TSH/ft4 in 2 weeks with PCP. PT/OT = Home health, face-to-face completed  DC her today     Assessment & Plan:  Active Problems:   CKD (chronic kidney disease), stage III (HCC)   Leukocytosis   Near syncope   Near syncope Etiology is currently unclear.  Ongoing metabolic workup.  Cortisol, BNP, troponins are normal.  Elevated TSH, low free T4.  Echo shows preserved EF 65%.  No obvious concerns of infection.  Atenolol on hold due to sinus bradycardia   Hypothyroidism - Elevated TSH, low free T4.  Synthroid increased . Recheck TSH/ft4 in 2 weeks with PCP   History of CAD status post bypass -Continue aspirin, Plavix and zetia.  Atenolol on hold due to bradycardia   Diabetes mellitus type 2 -Diet controlled?.  Placed on sliding scale.    Hyperlipidemia -Continue Zetia   Leukocytosis Resolved   AKI on CKD (chronic kidney disease), stage IIIa (HCC) Baseline Cr 1.5, peaked 1.84, slowly improving.  Encourage oral hydration.   PT/OT = Home health, face-to-face completed     DVT prophylaxis: lovenox Code Status: Full Family Communication:   Status is: Inpatient Remains inpatient appropriate because: Hopefully we can discharge the patient home later today       Subjective: Doing ok wants to go home     Examination:   General exam: Appears calm and comfortable ; elderly frail.  Respiratory system: Clear to auscultation. Respiratory effort normal. Cardiovascular system: S1 & S2 heard, RRR. No JVD, murmurs, rubs, gallops or clicks. No pedal edema. Gastrointestinal system: Abdomen is nondistended, soft and nontender. No organomegaly or masses felt. Normal bowel sounds heard. Central nervous system: Alert and oriented. No focal neurological deficits. Extremities: Symmetric 5 x 5 power. Skin: No rashes, lesions or ulcers Psychiatry: Judgement and insight appear normal. Mood & affect appropriate.       Discharge Diagnoses:  Active Problems:   CKD (chronic kidney disease), stage III (HCC)   Leukocytosis   Near syncope     Discharge Exam: Vitals:   01/04/23 0458 01/04/23 0720  BP: (!) 142/74 (!) 151/81  Pulse:  (!) 50  Resp:  17  Temp:  97.7 F (36.5 C)  SpO2:  95%   Vitals:   01/04/23 0450 01/04/23 0454 01/04/23 0458 01/04/23 0720  BP: (!) 160/76 (!) 144/79 (!) 142/74 (!) 151/81  Pulse: (!) 57 (!) 55  (!) 50  Resp: 18   17  Temp:    97.7 F (36.5 C)  TempSrc:    Oral  SpO2: 97%   95%  Weight:      Height:        Discharge Instructions   Allergies as of 01/04/2023       Reactions   Metformin Other (See Comments)   Ace Inhibitors Cough   Amlodipine Other (See Comments)   Severe leg cramps Severe leg cramps   Ampicillin Other (See Comments)   rash   Atorvastatin Other (See Comments)    Severe muscle cramping Severe muscle cramping   Codeine Other (See Comments)   dizziness dizziness   Elavil [amitriptyline] Other (See Comments)   Tongue swelling Tongue swelling   Hctz [hydrochlorothiazide] Other (See Comments)   Severe leg cramps Severe leg cramps Severe leg cramps   Hytrin [terazosin] Other (See Comments)   syncope   Melatonin Other (See Comments)   Headaches Headaches Headaches   Trazodone Other (See Comments)   Wasp Venom Other (See Comments)   Hand swelling Hand swelling   Erythromycin Rash        Medication List     STOP taking these medications    atenolol 50 MG tablet Commonly known as: TENORMIN       TAKE these medications    aspirin EC 81 MG tablet Take 81 mg by mouth daily. Swallow whole.   clopidogrel 75 MG tablet Commonly known as: PLAVIX Take 75 mg by mouth daily.   ezetimibe 10 MG tablet Commonly known as: ZETIA Take 10 mg by mouth daily.   hydrALAZINE 25 MG tablet Commonly known as: APRESOLINE Take 25 mg by mouth in the morning and at bedtime.   irbesartan 75 MG tablet Commonly known as: AVAPRO Take 75 mg by mouth daily.   levothyroxine 150 MCG tablet Commonly known as: SYNTHROID Take 1 tablet (150 mcg total) by mouth daily at 6 (six) AM. Start taking on: January 05, 2023 What changed:  medication strength how much to take when to take this        Follow-up Information     Ralene Ok, MD Follow up.   Specialty: Internal Medicine Contact information: [address] [place] [address] [phone]                Allergies  Allergen Reactions   Metformin Other (See Comments)   Ace Inhibitors Cough   Amlodipine Other (See Comments)    Severe leg cramps Severe leg cramps   Ampicillin Other (See Comments)    rash   Atorvastatin Other (See Comments)    Severe muscle cramping Severe muscle cramping   Codeine Other (See Comments)    dizziness dizziness   Elavil [Amitriptyline] Other  (See Comments)    Tongue swelling Tongue swelling   Hctz [Hydrochlorothiazide] Other (See Comments)    Severe leg cramps Severe leg cramps Severe leg cramps   Hytrin [Terazosin] Other (See Comments)    syncope   Melatonin Other (See Comments)    Headaches Headaches Headaches   Trazodone Other (See Comments)   Wasp Venom Other (See Comments)    Hand swelling Hand swelling   Erythromycin Rash    You were cared for by a hospitalist during your hospital stay. If you have any questions about your discharge medications or the care you received while you were in the hospital after you are discharged, you can call the unit and asked to speak with the hospitalist on call if the hospitalist that took care  of you is not available. Once you are discharged, your primary care physician will handle any further medical issues. Please note that no refills for any discharge medications will be authorized once you are discharged, as it is imperative that you return to your primary care physician (or establish a relationship with a primary care physician if you do not have one) for your aftercare needs so that they can reassess your need for medications and monitor your lab values.  You were cared for by a hospitalist during your hospital stay. If you have any questions about your discharge medications or the care you received while you were in the hospital after you are discharged, you can call the unit and asked to speak with the hospitalist on call if the hospitalist that took care of you is not available. Once you are discharged, your primary care physician will handle any further medical issues. Please note that NO REFILLS for any discharge medications will be authorized once you are discharged, as it is imperative that you return to your primary care physician (or establish a relationship with a primary care physician if you do not have one) for your aftercare needs so that they can reassess your need for  medications and monitor your lab values.  Please request your Prim.MD to go over all Hospital Tests and Procedure/Radiological results at the follow up, please get all Hospital records sent to your Prim MD by signing hospital release before you go home.  Get CBC, CMP, 2 view Chest X ray checked  by Primary MD during your next visit or SNF MD in 5-7 days ( we routinely change or add medications that can affect your baseline labs and fluid status, therefore we recommend that you get the mentioned basic workup next visit with your PCP, your PCP may decide not to get them or add new tests based on their clinical decision)  On your next visit with your primary care physician please Get Medicines reviewed and adjusted.  If you experience worsening of your admission symptoms, develop shortness of breath, life threatening emergency, suicidal or homicidal thoughts you must seek medical attention immediately by calling 911 or calling your MD immediately  if symptoms less severe.  You Must read complete instructions/literature along with all the possible adverse reactions/side effects for all the Medicines you take and that have been prescribed to you. Take any new Medicines after you have completely understood and accpet all the possible adverse reactions/side effects.   Do not drive, operate heavy machinery, perform activities at heights, swimming or participation in water activities or provide baby sitting services if your were admitted for syncope or siezures until you have seen by Primary MD or a Neurologist and advised to do so again.  Do not drive when taking Pain medications.   Procedures/Studies: ECHOCARDIOGRAM COMPLETE  Result Date: 01/03/2023    ECHOCARDIOGRAM REPORT   Patient Name:   LACONYA CLERE Date of Exam: 01/03/2023 Medical Rec #:  [id number]      Height:       66.0 in Accession #:    [id number]     Weight:       159.6 lb Date of Birth:  03/24/1935      BSA:          1.817 m Patient Age:     87 years       BP:           113/67 mmHg Patient Gender: F  HR:           54 bpm. Exam Location:  Inpatient Procedure: 2D Echo, Color Doppler and Cardiac Doppler Indications:    Cardiomyopathy  History:        Patient has prior history of Echocardiogram examinations, most                 recent 02/23/2017. CAD, Prior CABG, Stroke; Risk                 Factors:Hypertension, Diabetes and Dyslipidemia. Graves disease,                 pituitary tumor, CKD.  Sonographer:    Milda Smart Referring Phys: [id number] Miguel Rota  Sonographer Comments: Image acquisition challenging due to respiratory motion. IMPRESSIONS  1. Left ventricular ejection fraction, by estimation, is 65 to 70%. The left ventricle has normal function. The left ventricle has no regional wall motion abnormalities. There is moderate left ventricular hypertrophy. Left ventricular diastolic parameters are indeterminate.  2. Right ventricular systolic function is mildly reduced. The right ventricular size is normal. There is normal pulmonary artery systolic pressure. The estimated right ventricular systolic pressure is 20.5 mmHg.  3. The mitral valve is normal in structure. Trivial mitral valve regurgitation. No evidence of mitral stenosis.  4. The aortic valve is tricuspid. Aortic valve regurgitation is not visualized. Aortic valve sclerosis/calcification is present, without any evidence of aortic stenosis.  5. The inferior vena cava is normal in size with greater than 50% respiratory variability, suggesting right atrial pressure of 3 mmHg. FINDINGS  Left Ventricle: Left ventricular ejection fraction, by estimation, is 65 to 70%. The left ventricle has normal function. The left ventricle has no regional wall motion abnormalities. The left ventricular internal cavity size was small. There is moderate  left ventricular hypertrophy. Left ventricular diastolic parameters are indeterminate. Right Ventricle: The right ventricular size is normal. No  increase in right ventricular wall thickness. Right ventricular systolic function is mildly reduced. There is normal pulmonary artery systolic pressure. The tricuspid regurgitant velocity is 2.09 m/s, and with an assumed right atrial pressure of 3 mmHg, the estimated right ventricular systolic pressure is 20.5 mmHg. Left Atrium: Left atrial size was normal in size. Right Atrium: Right atrial size was normal in size. Pericardium: There is no evidence of pericardial effusion. Mitral Valve: The mitral valve is normal in structure. Trivial mitral valve regurgitation. No evidence of mitral valve stenosis. MV peak gradient, 5.7 mmHg. The mean mitral valve gradient is 2.0 mmHg. Tricuspid Valve: The tricuspid valve is normal in structure. Tricuspid valve regurgitation is trivial. Aortic Valve: The aortic valve is tricuspid. Aortic valve regurgitation is not visualized. Aortic valve sclerosis/calcification is present, without any evidence of aortic stenosis. Pulmonic Valve: The pulmonic valve was not well visualized. Pulmonic valve regurgitation is trivial. Aorta: The aortic root and ascending aorta are structurally normal, with no evidence of dilitation. Venous: The inferior vena cava is normal in size with greater than 50% respiratory variability, suggesting right atrial pressure of 3 mmHg. IAS/Shunts: The interatrial septum was not well visualized.  LEFT VENTRICLE PLAX 2D LVIDd:         3.20 cm     Diastology LVIDs:         1.90 cm     LV e' medial:    4.35 cm/s LV PW:         1.50 cm     LV E/e' medial:  18.8 LV IVS:  1.60 cm     LV e' lateral:   4.90 cm/s LVOT diam:     2.00 cm     LV E/e' lateral: 16.7 LV SV:         78 LV SV Index:   43 LVOT Area:     3.14 cm  LV Volumes (MOD) LV vol d, MOD A2C: 45.4 ml LV vol d, MOD A4C: 40.2 ml LV vol s, MOD A2C: 15.5 ml LV vol s, MOD A4C: 13.7 ml LV SV MOD A2C:     29.9 ml LV SV MOD A4C:     40.2 ml LV SV MOD BP:      29.0 ml RIGHT VENTRICLE            IVC RV Basal diam:   3.80 cm    IVC diam: 1.10 cm RV Mid diam:    3.70 cm RV S prime:     6.96 cm/s TAPSE (M-mode): 1.2 cm LEFT ATRIUM             Index        RIGHT ATRIUM           Index LA diam:        3.10 cm 1.71 cm/m   RA Area:     12.70 cm LA Vol (A2C):   42.9 ml 23.58 ml/m  RA Volume:   28.20 ml  15.52 ml/m LA Vol (A4C):   25.0 ml 13.76 ml/m LA Biplane Vol: 30.1 ml 16.57 ml/m  AORTIC VALVE LVOT Vmax:   101.00 cm/s LVOT Vmean:  84.100 cm/s LVOT VTI:    0.247 m  AORTA Ao Root diam: 3.20 cm Ao Asc diam:  3.70 cm MITRAL VALVE                TRICUSPID VALVE MV Area (PHT): 2.62 cm     TR Peak grad:   17.5 mmHg MV Area VTI:   2.14 cm     TR Mean grad:   12.0 mmHg MV Peak grad:  5.7 mmHg     TR Vmax:        209.00 cm/s MV Mean grad:  2.0 mmHg     TR Vmean:       171.0 cm/s MV Vmax:       1.19 m/s MV Vmean:      59.8 cm/s    SHUNTS MV Decel Time: 290 msec     Systemic VTI:  0.25 m MV E velocity: 81.80 cm/s   Systemic Diam: 2.00 cm MV A velocity: 114.00 cm/s MV E/A ratio:  0.72 Epifanio Lesches MD Electronically signed by Epifanio Lesches MD Signature Date/Time: 01/03/2023/6:32:17 PM    Final    DG CHEST PORT 1 VIEW  Result Date: 01/02/2023 CLINICAL DATA:  Pneumonia, near syncope. EXAM: PORTABLE CHEST 1 VIEW COMPARISON:  10/13/2019. FINDINGS: The heart size and mediastinal contours are within normal limits. There is atherosclerotic calcification of the aorta. Stable atelectasis or scarring is noted at the left lung base. No consolidation, effusion, or pneumothorax. Sternotomy wires are present over the midline. No acute osseous abnormality. IMPRESSION: No active disease. Electronically Signed   By: Thornell Sartorius M.D.   On: 01/02/2023 21:25     The results of significant diagnostics from this hospitalization (including imaging, microbiology, ancillary and laboratory) are listed below for reference.     Microbiology: Recent Results (from the past 240 hour(s))  SARS Coronavirus 2 by RT PCR (hospital order,  performed in [facility]  hospital lab) *cepheid single result test* Anterior Nasal Swab     Status: None   Collection Time: 01/02/23  7:51 PM   Specimen: Anterior Nasal Swab  Result Value Ref Range Status   SARS Coronavirus 2 by RT PCR NEGATIVE NEGATIVE Final    Comment: Performed at [facility], [address] [place], [place] [address]  Respiratory (~20 pathogens) panel by PCR     Status: None   Collection Time: 01/02/23  7:51 PM   Specimen: Nasopharyngeal Swab; Respiratory  Result Value Ref Range Status   Adenovirus NOT DETECTED NOT DETECTED Final   Coronavirus 229E NOT DETECTED NOT DETECTED Final    Comment: (NOTE) The Coronavirus on the Respiratory Panel, DOES NOT test for the novel  Coronavirus (2019 nCoV)    Coronavirus HKU1 NOT DETECTED NOT DETECTED Final   Coronavirus NL63 NOT DETECTED NOT DETECTED Final   Coronavirus OC43 NOT DETECTED NOT DETECTED Final   Metapneumovirus NOT DETECTED NOT DETECTED Final   Rhinovirus / Enterovirus NOT DETECTED NOT DETECTED Final   Influenza A NOT DETECTED NOT DETECTED Final   Influenza B NOT DETECTED NOT DETECTED Final   Parainfluenza Virus 1 NOT DETECTED NOT DETECTED Final   Parainfluenza Virus 2 NOT DETECTED NOT DETECTED Final   Parainfluenza Virus 3 NOT DETECTED NOT DETECTED Final   Parainfluenza Virus 4 NOT DETECTED NOT DETECTED Final   Respiratory Syncytial Virus NOT DETECTED NOT DETECTED Final   Bordetella pertussis NOT DETECTED NOT DETECTED Final   Bordetella Parapertussis NOT DETECTED NOT DETECTED Final   Chlamydophila pneumoniae NOT DETECTED NOT DETECTED Final   Mycoplasma pneumoniae NOT DETECTED NOT DETECTED Final    Comment: Performed at [facility], [address], [place], [place] [address]     Labs: BNP (last 3 results) Recent Labs    01/03/23 0330  BNP 98.5   Basic Metabolic Panel: Recent Labs  Lab 01/02/23 1202 01/02/23 2120 01/03/23 0325 01/04/23 0446  NA 136  --  139 138  K 4.4  --  4.2 4.4   CL 102  --  109 111  CO2 24  --  21* 21*  GLUCOSE 120*  --  102* 123*  BUN 26*  --  26* 24*  CREATININE 1.57* 1.65* 1.84* 1.63*  CALCIUM 9.3  --  8.8* 8.3*  MG  --   --   --  2.3  PHOS  --   --   --  3.7   Liver Function Tests: Recent Labs  Lab 01/03/23 0325  AST 17  ALT 11  ALKPHOS 69  BILITOT 0.7  PROT 6.3*  ALBUMIN 3.3*   No results for input(s): "LIPASE", "AMYLASE" in the last 168 hours. No results for input(s): "AMMONIA" in the last 168 hours. CBC: Recent Labs  Lab 01/02/23 1202 01/02/23 2120 01/03/23 0324 01/04/23 0446  WBC 11.2* 10.2 8.2 7.0  NEUTROABS  --   --  5.2  --   HGB 13.1 12.9 12.4 11.3*  HCT 42.1 40.1 38.8 36.4  MCV 92.1 91.1 92.4 93.8  PLT 414* 378 389 359   Cardiac Enzymes: No results for input(s): "CKTOTAL", "CKMB", "CKMBINDEX", "TROPONINI" in the last 168 hours. BNP: Invalid input(s): "POCBNP" CBG: Recent Labs  Lab 01/02/23 1450 01/03/23 1133 01/03/23 1535 01/03/23 2146 01/04/23 0610  GLUCAP 112* 113* 122* 126* 105*   D-Dimer No results for input(s): "DDIMER" in the last 72 hours. Hgb A1c Recent Labs    01/03/23 0849  HGBA1C 6.6*  Lipid Profile No results for input(s): "CHOL", "HDL", "LDLCALC", "TRIG", "CHOLHDL", "LDLDIRECT" in the last 72 hours. Thyroid function studies Recent Labs    01/03/23 0325  TSH 34.037*   Anemia work up No results for input(s): "VITAMINB12", "FOLATE", "FERRITIN", "TIBC", "IRON", "RETICCTPCT" in the last 72 hours. Urinalysis    Component Value Date/Time   COLORURINE YELLOW 01/02/2023 1445   APPEARANCEUR CLEAR 01/02/2023 1445   LABSPEC 1.011 01/02/2023 1445   PHURINE 6.0 01/02/2023 1445   GLUCOSEU NEGATIVE 01/02/2023 1445   GLUCOSEU NEGATIVE 11/30/2014 1534   HGBUR NEGATIVE 01/02/2023 1445   BILIRUBINUR NEGATIVE 01/02/2023 1445   BILIRUBINUR 1+ 05/31/2015 1206   KETONESUR NEGATIVE 01/02/2023 1445   PROTEINUR NEGATIVE 01/02/2023 1445   UROBILINOGEN negative 05/31/2015 1206   UROBILINOGEN  0.2 11/30/2014 1534   NITRITE NEGATIVE 01/02/2023 1445   LEUKOCYTESUR SMALL (A) 01/02/2023 1445   Sepsis Labs Recent Labs  Lab 01/02/23 1202 01/02/23 2120 01/03/23 0324 01/04/23 0446  WBC 11.2* 10.2 8.2 7.0   Microbiology Recent Results (from the past 240 hour(s))  SARS Coronavirus 2 by RT PCR (hospital order, performed in [facility] hospital lab) *cepheid single result test* Anterior Nasal Swab     Status: None   Collection Time: 01/02/23  7:51 PM   Specimen: Anterior Nasal Swab  Result Value Ref Range Status   SARS Coronavirus 2 by RT PCR NEGATIVE NEGATIVE Final    Comment: Performed at [facility] [place] [facility], [address] [place], [place] [address]  Respiratory (~20 pathogens) panel by PCR     Status: None   Collection Time: 01/02/23  7:51 PM   Specimen: Nasopharyngeal Swab; Respiratory  Result Value Ref Range Status   Adenovirus NOT DETECTED NOT DETECTED Final   Coronavirus 229E NOT DETECTED NOT DETECTED Final    Comment: (NOTE) The Coronavirus on the Respiratory Panel, DOES NOT test for the novel  Coronavirus (2019 nCoV)    Coronavirus HKU1 NOT DETECTED NOT DETECTED Final   Coronavirus NL63 NOT DETECTED NOT DETECTED Final   Coronavirus OC43 NOT DETECTED NOT DETECTED Final   Metapneumovirus NOT DETECTED NOT DETECTED Final   Rhinovirus / Enterovirus NOT DETECTED NOT DETECTED Final   Influenza A NOT DETECTED NOT DETECTED Final   Influenza B NOT DETECTED NOT DETECTED Final   Parainfluenza Virus 1 NOT DETECTED NOT DETECTED Final   Parainfluenza Virus 2 NOT DETECTED NOT DETECTED Final   Parainfluenza Virus 3 NOT DETECTED NOT DETECTED Final   Parainfluenza Virus 4 NOT DETECTED NOT DETECTED Final   Respiratory Syncytial Virus NOT DETECTED NOT DETECTED Final   Bordetella pertussis NOT DETECTED NOT DETECTED Final   Bordetella Parapertussis NOT DETECTED NOT DETECTED Final   Chlamydophila pneumoniae NOT DETECTED NOT DETECTED Final   Mycoplasma pneumoniae NOT DETECTED NOT  DETECTED Final    Comment: Performed at [facility], [address] [place], [place] [address]     Time coordinating discharge:  I have spent 35 minutes face to face with the patient and on the ward discussing the patients care, assessment, plan and disposition with other care givers. >50% of the time was devoted counseling the patient about the risks and benefits of treatment/Discharge disposition and coordinating care.   SIGNED:   Miguel Rota, MD  [facility] 01/04/2023, 9:45 AM   If 7PM-7AM, please contact night-coverage

## 2023-01-04 NOTE — TOC Transition Note (Signed)
Transition of Care [facility]) - CM/SW Discharge Note   Patient Details  Name: Amanda Curtis MRN: [id number] Date of Birth: [birth year]  Transition [facility] Care [facility]) CM/SW Contact:  Leone Haven, RN Phone Number: 01/04/2023, 10:34 AM   Clinical Narrative:    For dc today, patient will be doing her PT, OT with [facility] onsite , NCM faxed orders and dc summary to [facility] at [phone], phone [phone].  Her DIL will be transporting her home today.     Barriers to Discharge: Continued Medical Work up   Patient Goals and CMS Choice   Choice offered to / list presented to : NA  Discharge Placement                         Discharge Plan and Services Additional resources added to the After Visit Summary for   In-house Referral: NA Discharge Planning Services: CM Consult Post Acute Care Choice: NA          DME Arranged: N/A DME Agency: NA       HH Arranged: NA          Social Determinants of Health (SDOH) Interventions SDOH Screenings   Tobacco Use: Low Risk  (01/02/2023)     Readmission Risk Interventions     No data to display

## 2023-04-26 ENCOUNTER — Encounter: Payer: Self-pay | Admitting: Cardiovascular Disease

## 2023-04-26 NOTE — Progress Notes (Unsigned)
 Margo Common Date of Birth  August 25, 1935       [facility]    [place] [address]  [address] [place], [place]  [address]   [place], [place]  [address] [phone]     [phone]   Fax  [phone]    Fax [phone]  Problem List: 1. Hypertension 2. Chest pain-abnormal stress Myoview study 3. Hypothyroidism 4. Hyperlipidemia  History of Present Illness:  Feb. 11, 2015: Cc:  Work in visit for + [employer]  Mrs. Luan Pulling is a 88 year old female who recently moved from [place] ( saw Dr. Docia Barrier, [facility] ).   She has a history of hypertension. She's been having some episodes of chest discomfort.     She saw Ralene Ok, MD   for general medical care. At that time she complained of some chest pain and fatigue and dizziness.    She was seen by a cardiologist in [place] and has had a normal stress test.   She was seen in the ER last month for chest / neck pressure and fatigue and dizzines. She was referred for a Myoview study. Today she walked on a standard Bruce protocol treadmill test. She was unable to achieve target heart rate but did have significant ST segment depression. She was given a Pension scheme manager.  Her myoview images reveal mid-basil lateral ischemia.    She was worked into my DOD schedule for further evaluation.  She used to exercise regularly - until 2 years ago when she moved to [place] form [place].  She has developed fatigue with exertion.  She attributes her symptoms to her medications ( BP meds, generic synthroid)  Non smoker Rare ETOH Brother had CABG- late 40's  June 17, 2013:  Ms. Wormley presents for follow up visit. With we initially set her up for cardiac catheterization but she did not show for cath. We also started her on atorvastatin during that visit.  She stopped it after   The patient requested a second opinion from cardiology. She saw Dr. Rhona Leavens [facility])  who  performed a cardiac CT which confirmed severe 3 vessel CAD  She still has some CP with ambulation.    Feb. 28 , 2025 Riya is seen after a 10 year absence  She has had a CTA of her coronaries that showed 3 V CAD Has been followed in [place]        Current Outpatient Medications on File Prior to Visit  Medication Sig Dispense Refill   aspirin EC 81 MG tablet Take 81 mg by mouth daily. Swallow whole.     clopidogrel (PLAVIX) 75 MG tablet Take 75 mg by mouth daily.     ezetimibe (ZETIA) 10 MG tablet Take 10 mg by mouth daily.     hydrALAZINE (APRESOLINE) 25 MG tablet Take 25 mg by mouth in the morning and at bedtime.     irbesartan (AVAPRO) 75 MG tablet Take 75 mg by mouth daily.     levothyroxine (SYNTHROID) 150 MCG tablet Take 1 tablet (150 mcg total) by mouth daily at 6 (six) AM. 30 tablet 0   No current facility-administered medications on file prior to visit.    Allergies  Allergen Reactions   Metformin Other (See Comments)   Ace Inhibitors Cough   Amlodipine Other (See Comments)    Severe leg cramps Severe leg cramps   Ampicillin Other (See Comments)    rash  Atorvastatin Other (See Comments)    Severe muscle cramping Severe muscle cramping   Codeine Other (See Comments)    dizziness dizziness   Elavil [Amitriptyline] Other (See Comments)    Tongue swelling Tongue swelling   Hctz [Hydrochlorothiazide] Other (See Comments)    Severe leg cramps Severe leg cramps Severe leg cramps   Hytrin [Terazosin] Other (See Comments)    syncope   Melatonin Other (See Comments)    Headaches Headaches Headaches   Trazodone Other (See Comments)   Wasp Venom Other (See Comments)    Hand swelling Hand swelling   Erythromycin Rash    Past Medical History:  Diagnosis Date   Arthritis    Diabetes mellitus without complication (HCC)    Graves disease 2000   s/p RIA   Hypertension    Insomnia    Pituitary tumor    Stroke (HCC)    Vitamin D deficiency     Past  Surgical History:  Procedure Laterality Date   APPENDECTOMY     CORONARY ARTERY BYPASS GRAFT N/A 07/04/2013   Procedure: CORONARY ARTERY BYPASS GRAFTING (CABG) times four using left internal mammary artery and right saphenous leg vein.;  Surgeon: Alleen Borne, MD;  Location: [facility] OR;  Service: Open Heart Surgery;  Laterality: N/A;   INTRAOPERATIVE TRANSESOPHAGEAL ECHOCARDIOGRAM N/A 07/04/2013   Procedure: INTRAOPERATIVE TRANSESOPHAGEAL ECHOCARDIOGRAM;  Surgeon: Alleen Borne, MD;  Location: [facility] OR;  Service: Open Heart Surgery;  Laterality: N/A;   LEFT HEART CATHETERIZATION WITH CORONARY ANGIOGRAM N/A 06/30/2013   Procedure: LEFT HEART CATHETERIZATION WITH CORONARY ANGIOGRAM;  Surgeon: Micheline Chapman, MD;  Location: [facility] CATH LAB;  Service: Cardiovascular;  Laterality: N/A;   NASAL SEPTUM SURGERY     childhood    Social History   Tobacco Use  Smoking Status Never  Smokeless Tobacco Never    Social History   Substance and Sexual Activity  Alcohol Use No    Family History  Problem Relation Age of Onset   Dementia Mother    Cancer Mother        breast   Diabetes Father    Diabetes Paternal Grandmother    Hypertension Paternal Grandmother     Reviw of Systems:  Reviewed in the HPI.  All other systems are negative.   Physical Exam: There were no vitals taken for this visit.  No BP recorded.  {Refresh Note OR Click here to enter BP  :1}***    GEN:  Well nourished, well developed in no acute distress HEENT: Normal NECK: No JVD; No carotid bruits LYMPHATICS: No lymphadenopathy CARDIAC: RRR ***, no murmurs, rubs, gallops RESPIRATORY:  Clear to auscultation without rales, wheezing or rhonchi  ABDOMEN: Soft, non-tender, non-distended MUSCULOSKELETAL:  No edema; No deformity  SKIN: Warm and dry NEUROLOGIC:  Alert and oriented x 3  ECG:    Assessment / Plan:        We will start atorvastatin 80, ASA 81, NTG prn.

## 2023-04-27 ENCOUNTER — Ambulatory Visit: Payer: Medicare Other | Attending: Cardiovascular Disease | Admitting: Cardiovascular Disease

## 2023-04-27 VITALS — BP 130/90 | HR 52 | Ht 66.0 in | Wt 156.0 lb

## 2023-04-27 DIAGNOSIS — E039 Hypothyroidism, unspecified: Secondary | ICD-10-CM | POA: Diagnosis not present

## 2023-04-27 DIAGNOSIS — I10 Essential (primary) hypertension: Secondary | ICD-10-CM

## 2023-04-27 DIAGNOSIS — R001 Bradycardia, unspecified: Secondary | ICD-10-CM | POA: Diagnosis not present

## 2023-04-27 MED ORDER — NITROGLYCERIN 0.4 MG SL SUBL: 0.4000 mg | SUBLINGUAL_TABLET | SUBLINGUAL | 6 refills | Status: AC | PRN

## 2023-04-27 NOTE — Patient Instructions (Signed)
 Medication Instructions:  Your physician recommends that you continue on your current medications as directed. Please refer to the Current Medication list given to you today.  *If you need a refill on your cardiac medications before your next appointment, please call your pharmacy*  Lab Work: TODAY (go to 1st floor--[address] [facility]): TSH You may also go to any of these [facility] locations:   [facility] - [id number] ([facility]) - [phone] [address]   [place] - [address]  - [address]   If you have labs (blood work) drawn today and your tests are completely normal, you will receive your results only by: [employer] (if you have MyChart) OR A paper copy in the mail If you have any lab test that is abnormal or we need to change your treatment, we will call you to review the results.  Follow-Up: At [facility], you and your health needs are our priority.  As part of our continuing mission to provide you with exceptional heart care, we have created designated Provider Care Teams.  These Care Teams include your primary Cardiologist (physician) and Advanced Practice Providers (APPs -  Physician Assistants and Nurse Practitioners) who all work together to provide you with the care you need, when you need it.  Your next appointment:   4 month(s)  The format for your next appointment:   In Person  Provider:   Jari Favre, PA-C, Ronie Spies, PA-C, Robin Searing, NP, Tereso Newcomer, PA-C, or Perlie Gold, PA-C     Other Instructions Please have the cafeteria provide you a low salt diet  Please limit your processed meats - bacon, sausage, ham , bologna ,  Soup      1st Floor: - Lobby - Registration  - Pharmacy  - Lab - Cafe  2nd Floor: - PV Lab - Diagnostic Testing (echo, CT, nuclear med)  3rd Floor: - Vacant  4th Floor: - TCTS  (cardiothoracic surgery) - AFib Clinic - Structural Heart Clinic - Vascular Surgery  - Vascular Ultrasound  5th Floor: - HeartCare Cardiology (general and EP) - Clinical Pharmacy for coumadin, hypertension, lipid, weight-loss medications, and med management appointments    Valet parking services will be available as well.

## 2023-04-28 LAB — TSH: TSH: 84.2 u[IU]/mL — ABNORMAL HIGH (ref 0.450–4.500)

## 2023-07-30 NOTE — Progress Notes (Addendum)
 Cardiology Office Note    Patient Name: Amanda Curtis Date of Encounter: 07/30/2023  Primary Care Provider:  Edda Goo, MD Primary Cardiologist:  Ahmad Alert, MD Primary Electrophysiologist: None   Past Medical History    Past Medical History:  Diagnosis Date   Arthritis    Diabetes mellitus without complication (HCC)    Graves disease 2000   s/p RIA   Hypertension    Insomnia    Pituitary tumor    Stroke [facility])    Vitamin D  deficiency     History of Present Illness  Amanda Curtis is a 88 y.o. female with a PMH of CAD s/p CABG x 3 06/2013, HTN, HLD, DM type II, Graves' disease s/p radioactive ablation, left MCA CVA 01/2017, bilateral carotid stenosis (90% R ICA, 60% LICA) s/p right carotid endarterectomy 10/16/2019, pituitary macroadenoma, statin intolerant who presents today for follow-up.  [name] was seen initially by Dr. Alroy Aspen and is currently followed by Dr. Arlester Ladd for management of coronary disease.  She was seen initially with complaint of neck and chest pressure and a Myoview was completed showing lateral defect.  She underwent a treadmill stress test with possible false positive result.  She was advised to undergo left heart catheterization that was completed on 06/2013 and revealed three-vessel disease.  She was evaluated by CVTS and underwent CABG x 3 on 07/04/2013.  She was seen in follow-up on 09/10/2014 chest pain and underwent nuclear stress test that was abnormal without clear impression of ischemia.  She was admitted on 02/22/2018 with sudden onset of right-sided weakness and MRI revealed left MCA stroke.  She had ASA increased 325 mg and underwent carotid Doppler which revealed moderate to severe right internal carotid stenosis.  She was evaluated by vascular surgery however declined right carotid enterectomy at that time.  She was seen on 05/2017 following her CVA and was advised to try PCSK9's due to recent stroke.  She was reluctant but agreed to try Livalo  and wore  a an event monitor that showed no arrhythmias.  She was seen in follow-up on 09/08/2019 and reported some substernal chest pain.  She had a stress test ordered and was started on Imdur  however did not complete stress test. She presented to the ED with complaint of expressive aphasia and fatigue and underwent right carotid endarterectomy October 16, 2019. She developed a severe hematoma postoperatively and was taken emergently back to the operating room for evacuation of the hematoma.  She was last seen by Dr. Arlester Ladd on 12/19/2019 and reported no shortness of breath but expressed lots of fatigue since hospitalization.  She was currently on atenolol  and low-dose aspirin  and clopidogrel .  She was advised to try PCSK9's due to being above goal with lipids.  She was evaluated by Pharm.D. and PA for Repatha  was submitted and approved.  She was evaluated in the ED on 01/02/2023 with complaint of near syncope at independent living facility.  She was noted to be bradycardic with heart rate in the 50s.  She had 2D echo completed that showed EF of 65% and atenolol  was placed on hold due to sinus bradycardia.  Her TSH was elevated and Synthroid  was increased 150 mcg.  She was seen by Dr. Alroy Aspen on  04/27/2023 for hospital follow-up.  During visit she reported being out of Synthroid  for months and this was reinitiated.  She reported some indiscretions with salt and blood pressure was initially elevated but improved on recheck.  [name] presents today with  her sister for 69-month follow-up.  During her previous visit she reported dizziness related to decreased heart rate and had atenolol  discontinued.he has felt better since then with no new episodes of dizziness or syncope. She experiences weakness and nausea, which she associates with taking a bladder medication, possibly AZO, and has difficulty swallowing large pills like Tylenol . She has a decreased appetite and significant weight loss from 162 lbs to 148 lbs over a month. She  reports not eating much and disliking the food at her current living facility. Her fluid intake is low, and she acknowledges not drinking enough, which may contribute to her symptoms. Her kidney function is currently at stage 3 with a filtration rate of 31. She is currently taking Synthroid  (levothyroxine ) and reports that her medication management is well-handled at her facility, with regular checks of her heart rate and blood pressure before administration. She is also on blood pressure medications, hydralazine  and Avapro . She has a history of prediabetes with an A1c of 6.5% as of December, which is on the low end of the abnormal range. Patient denies chest pain, palpitations, dyspnea, PND, orthopnea, nausea, vomiting, dizziness, syncope, edema, weight gain, or early satiety.  Discussed the use of AI scribe software for clinical note transcription with the patient, who gave verbal consent to proceed.  History of Present Illness   Review of Systems  Please see the history of present illness.    All other systems reviewed and are otherwise negative except as noted above.  Physical Exam     Wt Readings from Last 3 Encounters:  04/27/23 156 lb (70.8 kg)  01/04/23 158 lb 8.2 oz (71.9 kg)  12/19/19 156 lb 3.2 oz (70.9 kg)   FA:OZHYQ were no vitals filed for this visit.,There is no height or weight on file to calculate BMI. GEN: Well nourished, well developed in no acute distress Neck: No JVD; No carotid bruits Pulmonary: Clear to auscultation without rales, wheezing or rhonchi  Cardiovascular: Normal rate. Regular rhythm. Normal S1. Normal S2.   Murmurs: There is no murmur.  ABDOMEN: Soft, non-tender, non-distended EXTREMITIES:  No edema; No deformity   EKG/LABS/ Recent Cardiac Studies   ECG personally reviewed by me today -none completed today  Risk Assessment/Calculations:          Lab Results  Component Value Date   WBC 7.0 01/04/2023   HGB 11.3 (L) 01/04/2023   HCT 36.4  01/04/2023   MCV 93.8 01/04/2023   PLT 359 01/04/2023   Lab Results  Component Value Date   CREATININE 1.63 (H) 01/04/2023   BUN 24 (H) 01/04/2023   NA 138 01/04/2023   K 4.4 01/04/2023   CL 111 01/04/2023   CO2 21 (L) 01/04/2023   Lab Results  Component Value Date   CHOL 226 (H) 02/23/2017   HDL 31 (L) 02/23/2017   LDLCALC 160 (H) 02/23/2017   LDLDIRECT 227.0 11/30/2014   TRIG 176 (H) 02/23/2017   CHOLHDL 7.3 02/23/2017    Lab Results  Component Value Date   HGBA1C 6.6 (H) 01/03/2023   Assessment & Plan    Assessment & Plan  1.  History of CAD: -s/p CABG x 3 06/2013 with no current complaints of chest pain or angina. - Continue current GDMT with ASA 81 mg, Plavix  75 mg, Zetia  10 mg and as needed Nitrostat  0.4 mg  2.  Essential hypertension: - Patient's blood pressure today was stable at 136/72 - Continue Avapro  75 mg, hydralazine  25 mg  3.  Hyperlipidemia: - Patient's last LDL cholesterols were above goal and was previously advised to begin PCSK9 - Patient will be referred to [facility] to discuss PCSK9 and alternate lipid-lowering medications - Continue ezetimibe  10 mg daily  4.  History of CVA: -left MCA CVA 01/2017 - Continue Plavix  75 mg and ASA 81 mg - Patient will be referred to Pharm.D. to discuss lipid-lowering medications  5.  Decreased appetite and weight loss: Significant weight loss from 162 lbs to 147 lbs due to decreased appetite and -dietary changes at her living facility. Nutritional intake is insufficient, causing weakness and potential dizziness. - Encourage increased caloric intake and consider protein shakes like Ensure. - Discuss dietary preferences with facility staff to improve meal satisfaction.  6. Chronic kidney disease, stage 3: Chronic kidney disease stage 3 with creatinine 1.5 mg/dL and GFR 31 mL/min. Dehydration may be contributing to elevated creatinine levels. - Encourage increased fluid intake to improve hydration and kidney  function.  Disposition: Follow-up with Ahmad Alert, MD or APP in 6 months   Signed, Francene Ing, Retha Cast, NP 07/30/2023, 12:24 PM [facility]

## 2023-07-31 ENCOUNTER — Ambulatory Visit: Payer: Medicare Other | Attending: Nurse Practitioner | Admitting: Nurse Practitioner

## 2023-07-31 ENCOUNTER — Encounter: Payer: Self-pay | Admitting: Nurse Practitioner

## 2023-07-31 VITALS — BP 136/72 | HR 55 | Ht 66.0 in | Wt 147.0 lb

## 2023-07-31 DIAGNOSIS — I63132 Cerebral infarction due to embolism of left carotid artery: Secondary | ICD-10-CM | POA: Diagnosis not present

## 2023-07-31 DIAGNOSIS — I2510 Atherosclerotic heart disease of native coronary artery without angina pectoris: Secondary | ICD-10-CM

## 2023-07-31 DIAGNOSIS — E1129 Type 2 diabetes mellitus with other diabetic kidney complication: Secondary | ICD-10-CM

## 2023-07-31 DIAGNOSIS — E785 Hyperlipidemia, unspecified: Secondary | ICD-10-CM

## 2023-07-31 DIAGNOSIS — I10 Essential (primary) hypertension: Secondary | ICD-10-CM | POA: Diagnosis not present

## 2023-07-31 NOTE — Patient Instructions (Signed)
 Medication Instructions:  Your physician recommends that you continue on your current medications as directed. Please refer to the Current Medication list given to you today. *If you need a refill on your cardiac medications before your next appointment, please call your pharmacy*  Lab Work: None ordered If you have labs (blood work) drawn today and your tests are completely normal, you will receive your results only by: MyChart Message (if you have MyChart) OR A paper copy in the mail If you have any lab test that is abnormal or we need to change your treatment, we will call you to review the results.  Testing/Procedures: None ordered  Follow-Up: At [facility], you and your health needs are our priority.  As part of our continuing mission to provide you with exceptional heart care, our providers are all part of one team.  This team includes your primary Cardiologist (physician) and Advanced Practice Providers or APPs (Physician Assistants and Nurse Practitioners) who all work together to provide you with the care you need, when you need it.  Your next appointment:   6 month(s)  Provider:   Arnoldo Lapping, MD    We recommend signing up for the patient portal called "MyChart".  Sign up information is provided on this After Visit Summary.  MyChart is used to connect with patients for Virtual Visits (Telemedicine).  Patients are able to view lab/test results, encounter notes, upcoming appointments, etc.  Non-urgent messages can be sent to your provider as well.   To learn more about what you can do with MyChart, go to [email or web address].   Other Instructions Please check your weight daily. Please contact the office if you gain more than 2lbs in a day or 5lbs in a week.  Limit your salt intake to 1500-2000mg  per day or 500mg  of Sodium per meal.

## 2023-09-18 ENCOUNTER — Telehealth: Payer: Self-pay

## 2023-09-18 ENCOUNTER — Other Ambulatory Visit (HOSPITAL_COMMUNITY): Payer: Self-pay

## 2023-09-18 ENCOUNTER — Telehealth: Payer: Self-pay | Admitting: Pharmacist

## 2023-09-18 ENCOUNTER — Encounter: Payer: Self-pay | Admitting: Pharmacist

## 2023-09-18 ENCOUNTER — Ambulatory Visit: Attending: Cardiology | Admitting: Pharmacist

## 2023-09-18 DIAGNOSIS — E785 Hyperlipidemia, unspecified: Secondary | ICD-10-CM

## 2023-09-18 DIAGNOSIS — M791 Myalgia, unspecified site: Secondary | ICD-10-CM

## 2023-09-18 DIAGNOSIS — T466X5D Adverse effect of antihyperlipidemic and antiarteriosclerotic drugs, subsequent encounter: Secondary | ICD-10-CM

## 2023-09-18 DIAGNOSIS — I63132 Cerebral infarction due to embolism of left carotid artery: Secondary | ICD-10-CM | POA: Diagnosis not present

## 2023-09-18 DIAGNOSIS — I2510 Atherosclerotic heart disease of native coronary artery without angina pectoris: Secondary | ICD-10-CM | POA: Diagnosis not present

## 2023-09-18 NOTE — Patient Instructions (Addendum)
 Nice seeing you two today  We would like your LDL (bad cholesterol) to be less than 55  Please continue your ezetimibe  10mg  once a day  The medication we discussed today is called Repatha , which is an injection you would receive once every 2 weeks  I will complete the prior authorization for you and contact you with the result  I will also check the cost of Leqvio for you  Once you start the medication we will recheck your fasting lipid panel in 2-3 months   Medford Bolk, PharmD, BCACP, CDCES, CPP [facility] [address] [place], [place] [address] Phone: [phone]; Fax: [phone] 09/18/2023 1:31 PM

## 2023-09-18 NOTE — Telephone Encounter (Signed)
 Please run PA for Repatha Shelva

## 2023-09-18 NOTE — Progress Notes (Signed)
 Patient ID: Amanda Curtis                 DOB: Jun 06, 1935                    MRN: [id number]     HPI: Amanda Curtis is a 88 y.o. female patient referred to lipid clinic by Amanda Alberts NP. Patient of Dr Wonda. PMH is significant for HTN, T2DM, CAD, CVA, CKD, and CABG x 4. Patient is statin intolerant.  Patient presents today with younger sister. Presents in wheelchair. Currently lives at [facility] assisted [facility]. Med techs there give her her medications. Currently on Zetia  10mg  for lipid lowering.  Has intolerance to atorvastatin , pravastatin , pitavastatin , and rosuvastatin .  Does not like the food they are providing her at [facility]. Was enrolled in exercise program at facility but sister believes it needs to be re-authorized.    Current Medications: Zetia  10mg   Intolerances:  Pravastatin  Atorvastatin  Rosuvastatin  Pitavastatin   Risk Factors:  CAD T2DM Hx of CVA Hx of CABG  LDL goal: <55   Labs: TC 186, Tris 194, HDL 41, LDL 114 (02/14/24)  Past Medical History:  Diagnosis Date   Arthritis    Diabetes mellitus without complication (HCC)    Graves disease 2000   s/p RIA   Hypertension    Insomnia    Pituitary tumor    Stroke [facility] [address])    Vitamin D  deficiency     Current Outpatient Medications on File Prior to Visit  Medication Sig Dispense Refill   REMERON 15 MG tablet Take 15 mg by mouth at bedtime.     Acetaminophen  Extra Strength 500 MG TABS Take 2 tablets by mouth 3 (three) times daily.     aspirin  EC 81 MG tablet Take 81 mg by mouth daily. Swallow whole.     Cholecalciferol (VITAMIN D3) 50 MCG (2000 UT) TABS Take 1 tablet by mouth daily.     clopidogrel  (PLAVIX ) 75 MG tablet Take 75 mg by mouth daily.     cyanocobalamin  (VITAMIN B12) 1000 MCG tablet Take 1 tablet by mouth daily.     ezetimibe  (ZETIA ) 10 MG tablet Take 10 mg by mouth daily.     hydrALAZINE  (APRESOLINE ) 25 MG tablet Take 25 mg by mouth in the morning and at bedtime.     irbesartan  (AVAPRO ) 75  MG tablet Take 75 mg by mouth daily.     levothyroxine  (SYNTHROID ) 150 MCG tablet Take 1 tablet (150 mcg total) by mouth daily at 6 (six) AM. 30 tablet 0   nitroGLYCERIN  (NITROSTAT ) 0.4 MG SL tablet Place 1 tablet (0.4 mg total) under the tongue every 5 (five) minutes as needed for chest pain. Max of 3 doses, then call 911. 25 tablet 6   sertraline (ZOLOFT) 25 MG tablet Take 25 mg by mouth daily.     No current facility-administered medications on file prior to visit.    Allergies  Allergen Reactions   Metformin Other (See Comments)   Ace Inhibitors Cough   Amlodipine  Other (See Comments)    Severe leg cramps Severe leg cramps   Ampicillin Other (See Comments)    rash   Atorvastatin  Other (See Comments)    Severe muscle cramping Severe muscle cramping   Codeine  Other (See Comments)    dizziness dizziness   Elavil  [Amitriptyline ] Other (See Comments)    Tongue swelling Tongue swelling   Hctz [Hydrochlorothiazide ] Other (See Comments)    Severe leg cramps Severe leg cramps Severe leg  cramps   Hytrin  [Terazosin ] Other (See Comments)    syncope   Melatonin Other (See Comments)    Headaches Headaches Headaches   Trazodone  Other (See Comments)   Wasp Venom Other (See Comments)    Hand swelling Hand swelling   Erythromycin Rash    Assessment/Plan:  1. Hyperlipidemia - Patient last LDL of 114 is above goal of <55.  Aggressive goal due to CVA, CABG, and T2DM. Since she is intolerant to statins, recommend PCSK9i or inclisiran.  Using demo pen, educated patient on PCSK9i mechanism of action, storage, site selection, administration, and possible adverse effects. Will complete PA and contact sister with result. Since staff at facility will need to administer medication, will also submit coverage determination form for Leqvio.   Continue Zetia  10mg  daily Start PCSK9i or inclisiran depending on coverage  Chris Jadalee Westcott, PharmD, [place], CDCES, CPP [facility] [address] [place], [place] [address] Phone: [phone]; Fax: [phone] 09/18/2023 2:01 PM

## 2023-09-18 NOTE — Telephone Encounter (Signed)
 Pharmacy Patient Advocate Encounter   Received notification from Physician's Office that prior authorization for REPATHA  is required/requested.   Insurance verification completed.   The patient is insured through [employer] .   Per test claim: The current 28 day co-pay is, $47.  No PA needed at this time. This test claim was processed through [facility] copay amounts may vary at other pharmacies due to pharmacy/plan contracts, or as the patient moves through the different stages of their insurance plan.

## 2023-09-18 NOTE — Telephone Encounter (Signed)
 Pharmacy Patient Advocate Encounter   Received notification from Physician's Office that prior authorization for REPATHA  is required/requested.   Insurance verification completed.   The patient is insured through Amanda Curtis .   Per test claim: The current 28 day co-pay is, $47.  No PA needed at this time. This test claim was processed through [facility] copay amounts may vary at other pharmacies due to pharmacy/plan contracts, or as the patient moves through the different stages of their insurance plan.

## 2023-09-19 ENCOUNTER — Telehealth: Payer: Self-pay | Admitting: Pharmacist

## 2023-09-19 NOTE — Telephone Encounter (Signed)
Leqvio enrollment form faxed to service center 

## 2023-09-25 ENCOUNTER — Telehealth: Payer: Self-pay | Admitting: Cardiovascular Disease

## 2023-09-25 NOTE — Telephone Encounter (Signed)
 Patient calling for update on cholesterol medication. She states that that our office was going to run 2 different medications to see which one her insurance covered better. Please advise.

## 2023-09-25 NOTE — Telephone Encounter (Signed)
 Called spoke to patient 's sister Corrinda. She states she was with the patient at [facility]  to discuss cholesterol options.  Aware the message will be sent Pharm-D for information

## 2023-09-26 NOTE — Telephone Encounter (Signed)
 Leqvio benefits investigation is still pending.

## 2023-09-27 ENCOUNTER — Telehealth: Payer: Self-pay | Admitting: Pharmacist

## 2023-09-27 NOTE — Telephone Encounter (Signed)
 Contacted patient to give copays for Repatha  and Leqvio. No answer, lmom

## 2023-11-23 ENCOUNTER — Telehealth: Payer: Self-pay | Admitting: Cardiovascular Disease

## 2023-11-23 NOTE — Telephone Encounter (Signed)
 Returned call to [employer] at [employer] no answer.I will send message to Jackee Alberts NP to make him aware.

## 2023-11-23 NOTE — Telephone Encounter (Signed)
 Melissa from [facility] is requesting a callback regarding pt refusing daily weight since 10/23/23. Please advise

## 2023-11-26 NOTE — Telephone Encounter (Signed)
 Left Amanda Curtis a message with Jackee recommendations. Advised a call back with any questions or concerns.

## 2024-02-04 ENCOUNTER — Ambulatory Visit: Attending: Cardiovascular Disease | Admitting: Cardiovascular Disease

## 2024-02-04 ENCOUNTER — Encounter: Payer: Self-pay | Admitting: Cardiovascular Disease

## 2024-02-04 VITALS — BP 128/72 | HR 71 | Ht 66.0 in | Wt 165.0 lb

## 2024-02-04 DIAGNOSIS — I2510 Atherosclerotic heart disease of native coronary artery without angina pectoris: Secondary | ICD-10-CM

## 2024-02-04 DIAGNOSIS — I6523 Occlusion and stenosis of bilateral carotid arteries: Secondary | ICD-10-CM

## 2024-02-04 DIAGNOSIS — I10 Essential (primary) hypertension: Secondary | ICD-10-CM | POA: Diagnosis not present

## 2024-02-04 DIAGNOSIS — E782 Mixed hyperlipidemia: Secondary | ICD-10-CM | POA: Diagnosis not present

## 2024-02-04 NOTE — Assessment & Plan Note (Signed)
 The patient is clinically stable with no anginal symptoms.  Since she is on chronic oral anticoagulation, I have recommended that she discontinue aspirin  therapy at this time.  Otherwise no medication changes were made today.

## 2024-02-04 NOTE — Assessment & Plan Note (Signed)
 Status post carotid endarterectomy.  Continue medical therapy.

## 2024-02-04 NOTE — Assessment & Plan Note (Signed)
 Statin intolerant.  Unable to get PCSK9 or other injectables.  Continue Zetia .

## 2024-02-04 NOTE — Progress Notes (Signed)
 Cardiology Office Note:    Date:  02/04/2024   ID:  Amanda Curtis, DOB [birth year], MRN [id number]  PCP:  Valma Carwin, MD   [facility] Providers Cardiologist:  Aleene Passe, MD (Inactive)     Referring MD: Valma Carwin, MD   Chief Complaint  Patient presents with   Coronary Artery Disease    History of Present Illness:    Amanda Curtis is a 88 y.o. female with a hx of coronary artery disease, presenting for follow-up evaluation.  She underwent multivessel CABG in 2015.  Other cardiovascular problems include carotid stenosis, hypertension, and mixed hyperlipidemia.  The patient presented with a left MCA infarct in 2018 and she was noted to have moderate stenosis of the left carotid artery but severe stenosis of the right carotid artery prompted endarterectomy in 2021.  The patient is here with her sister today. She is living at [facility]. She has a lot of problems with her left knee and this causes her biggest functional limitations. Today, she denies symptoms of palpitations, chest pain, shortness of breath, orthopnea, PND, lower extremity edema, dizziness, or syncope.    Current Medications: Current Meds  Medication Sig   Acetaminophen  Extra Strength 500 MG TABS Take 2 tablets by mouth 3 (three) times daily.   aspirin  EC 81 MG tablet Take 81 mg by mouth daily. Swallow whole.   Cholecalciferol (VITAMIN D3) 50 MCG (2000 UT) TABS Take 1 tablet by mouth daily.   clopidogrel  (PLAVIX ) 75 MG tablet Take 75 mg by mouth daily.   cyanocobalamin  (VITAMIN B12) 1000 MCG tablet Take 1 tablet by mouth daily.   ezetimibe  (ZETIA ) 10 MG tablet Take 10 mg by mouth daily.   hydrALAZINE  (APRESOLINE ) 25 MG tablet Take 25 mg by mouth in the morning and at bedtime. (Patient taking differently: Take 25 mg by mouth 3 (three) times daily.)   irbesartan  (AVAPRO ) 75 MG tablet Take 75 mg by mouth daily.   levothyroxine  (SYNTHROID ) 75 MCG tablet Take 75 mcg by mouth daily before  breakfast.   mirtazapine (REMERON) 30 MG tablet Take 30 mg by mouth at bedtime.   nitroGLYCERIN  (NITROSTAT ) 0.4 MG SL tablet Place 1 tablet (0.4 mg total) under the tongue every 5 (five) minutes as needed for chest pain. Max of 3 doses, then call 911.   sertraline (ZOLOFT) 25 MG tablet Take 25 mg by mouth daily.     Allergies:   Metformin, Ace inhibitors, Amlodipine , Ampicillin, Atenolol , Atorvastatin , Codeine , Elavil  [amitriptyline ], Hctz [hydrochlorothiazide ], Hytrin  [terazosin ], Linagliptin , Losartan , Melatonin, Trazodone , Wasp venom, and Erythromycin   ROS:   Please see the history of present illness.    All other systems reviewed and are negative.  EKGs/Labs/Other Studies Reviewed:    The following studies were reviewed today: Cardiac Studies & Procedures   ______________________________________________________________________________________________     ECHOCARDIOGRAM  ECHOCARDIOGRAM COMPLETE 01/03/2023  Narrative ECHOCARDIOGRAM REPORT    Patient Name:   Amanda Curtis Date of Exam: 01/03/2023 Medical Rec #:  [id number]      Height:       66.0 in Accession #:    [id number]     Weight:       159.6 lb Date of Birth:  05-May-1935      BSA:          1.817 m Patient Age:    87 years       BP:           113/67 mmHg Patient Gender: F  HR:           54 bpm. Exam Location:  Inpatient  Procedure: 2D Echo, Color Doppler and Cardiac Doppler  Indications:    Cardiomyopathy  History:        Patient has prior history of Echocardiogram examinations, most recent 02/23/2017. CAD, Prior CABG, Stroke; Risk Factors:Hypertension, Diabetes and Dyslipidemia. Graves disease, pituitary tumor, CKD.  Sonographer:    Clotilda Center Referring Phys: [id number] BURGESS JAYSON DARE   Sonographer Comments: Image acquisition challenging due to respiratory motion. IMPRESSIONS   1. Left ventricular ejection fraction, by estimation, is 65 to 70%. The left ventricle has normal function. The left  ventricle has no regional wall motion abnormalities. There is moderate left ventricular hypertrophy. Left ventricular diastolic parameters are indeterminate. 2. Right ventricular systolic function is mildly reduced. The right ventricular size is normal. There is normal pulmonary artery systolic pressure. The estimated right ventricular systolic pressure is 20.5 mmHg. 3. The mitral valve is normal in structure. Trivial mitral valve regurgitation. No evidence of mitral stenosis. 4. The aortic valve is tricuspid. Aortic valve regurgitation is not visualized. Aortic valve sclerosis/calcification is present, without any evidence of aortic stenosis. 5. The inferior vena cava is normal in size with greater than 50% respiratory variability, suggesting right atrial pressure of 3 mmHg.  FINDINGS Left Ventricle: Left ventricular ejection fraction, by estimation, is 65 to 70%. The left ventricle has normal function. The left ventricle has no regional wall motion abnormalities. The left ventricular internal cavity size was small. There is moderate left ventricular hypertrophy. Left ventricular diastolic parameters are indeterminate.  Right Ventricle: The right ventricular size is normal. No increase in right ventricular wall thickness. Right ventricular systolic function is mildly reduced. There is normal pulmonary artery systolic pressure. The tricuspid regurgitant velocity is 2.09 m/s, and with an assumed right atrial pressure of 3 mmHg, the estimated right ventricular systolic pressure is 20.5 mmHg.  Left Atrium: Left atrial size was normal in size.  Right Atrium: Right atrial size was normal in size.  Pericardium: There is no evidence of pericardial effusion.  Mitral Valve: The mitral valve is normal in structure. Trivial mitral valve regurgitation. No evidence of mitral valve stenosis. MV peak gradient, 5.7 mmHg. The mean mitral valve gradient is 2.0 mmHg.  Tricuspid Valve: The tricuspid valve is  normal in structure. Tricuspid valve regurgitation is trivial.  Aortic Valve: The aortic valve is tricuspid. Aortic valve regurgitation is not visualized. Aortic valve sclerosis/calcification is present, without any evidence of aortic stenosis.  Pulmonic Valve: The pulmonic valve was not well visualized. Pulmonic valve regurgitation is trivial.  Aorta: The aortic root and ascending aorta are structurally normal, with no evidence of dilitation.  Venous: The inferior vena cava is normal in size with greater than 50% respiratory variability, suggesting right atrial pressure of 3 mmHg.  IAS/Shunts: The interatrial septum was not well visualized.   LEFT VENTRICLE PLAX 2D LVIDd:         3.20 cm     Diastology LVIDs:         1.90 cm     LV e' medial:    4.35 cm/s LV PW:         1.50 cm     LV E/e' medial:  18.8 LV IVS:        1.60 cm     LV e' lateral:   4.90 cm/s LVOT diam:     2.00 cm     LV E/e' lateral: 16.7 LV SV:  78 LV SV Index:   43 LVOT Area:     3.14 cm  LV Volumes (MOD) LV vol d, MOD A2C: 45.4 ml LV vol d, MOD A4C: 40.2 ml LV vol s, MOD A2C: 15.5 ml LV vol s, MOD A4C: 13.7 ml LV SV MOD A2C:     29.9 ml LV SV MOD A4C:     40.2 ml LV SV MOD BP:      29.0 ml  RIGHT VENTRICLE            IVC RV Basal diam:  3.80 cm    IVC diam: 1.10 cm RV Mid diam:    3.70 cm RV S prime:     6.96 cm/s TAPSE (M-mode): 1.2 cm  LEFT ATRIUM             Index        RIGHT ATRIUM           Index LA diam:        3.10 cm 1.71 cm/m   RA Area:     12.70 cm LA Vol (A2C):   42.9 ml 23.58 ml/m  RA Volume:   28.20 ml  15.52 ml/m LA Vol (A4C):   25.0 ml 13.76 ml/m LA Biplane Vol: 30.1 ml 16.57 ml/m AORTIC VALVE LVOT Vmax:   101.00 cm/s LVOT Vmean:  84.100 cm/s LVOT VTI:    0.247 m  AORTA Ao Root diam: 3.20 cm Ao Asc diam:  3.70 cm  MITRAL VALVE                TRICUSPID VALVE MV Area (PHT): 2.62 cm     TR Peak grad:   17.5 mmHg MV Area VTI:   2.14 cm     TR Mean grad:   12.0  mmHg MV Peak grad:  5.7 mmHg     TR Vmax:        209.00 cm/s MV Mean grad:  2.0 mmHg     TR Vmean:       171.0 cm/s MV Vmax:       1.19 m/s MV Vmean:      59.8 cm/s    SHUNTS MV Decel Time: 290 msec     Systemic VTI:  0.25 m MV E velocity: 81.80 cm/s   Systemic Diam: 2.00 cm MV A velocity: 114.00 cm/s MV E/A ratio:  0.72  Lonni Nanas MD Electronically signed by Lonni Nanas MD Signature Date/Time: 01/03/2023/6:32:17 PM    Final    MONITORS  CARDIAC EVENT MONITOR 06/08/2017  Narrative The basic rhythm is sinus rhythm/sinus bradycardia with an average heart rate of 59 bpm No evidence of atrial fibrillation or flutter No ventricular arrhythmia identified No pathologic pauses or significant bradycardic events noted   CT SCANS  CT CORONARY MORPH W/CTA COR W/SCORE 05/13/2013     ______________________________________________________________________________________________      EKG:   EKG Interpretation Date/Time:  Monday February 04 2024 14:24:25 EST Ventricular Rate:  71 PR Interval:  188 QRS Duration:  60 QT Interval:  392 QTC Calculation: 425 R Axis:   -28  Text Interpretation: Normal sinus rhythm Anteroseptal infarct (cited on or before 04-Feb-2024) When compared with ECG of 02-Jan-2023 12:11, Serial changes of Anteroseptal infarct Present Confirmed by Wonda Sharper [phone]) on 02/04/2024 2:44:57 PM    Recent Labs: 04/27/2023: TSH 84.200  Recent Lipid Panel    Component Value Date/Time   CHOL 226 (H) 02/23/2017 0307   CHOL 289 (H) 06/28/2016 1514   TRIG  176 (H) 02/23/2017 0307   HDL 31 (L) 02/23/2017 0307   HDL 39 (L) 06/28/2016 1514   CHOLHDL 7.3 02/23/2017 0307   VLDL 35 02/23/2017 0307   LDLCALC 160 (H) 02/23/2017 0307   LDLCALC 191 (H) 06/28/2016 1514   LDLDIRECT 227.0 11/30/2014 1534     Risk Assessment/Calculations:                Physical Exam:    VS:  BP 128/72   Pulse 71   Ht 5' 6 (1.676 m)   Wt 165 lb (74.8 kg)    SpO2 95%   BMI 26.63 kg/m     Wt Readings from Last 3 Encounters:  02/04/24 165 lb (74.8 kg)  07/31/23 147 lb (66.7 kg)  04/27/23 156 lb (70.8 kg)     GEN:  Well nourished, well developed in no acute distress HEENT: Normal NECK: No JVD; soft bilateral carotid bruits LYMPHATICS: No lymphadenopathy CARDIAC: RRR, no murmurs, rubs, gallops RESPIRATORY:  Clear to auscultation without rales, wheezing or rhonchi  ABDOMEN: Soft, non-tender, non-distended MUSCULOSKELETAL:  No edema; No deformity  SKIN: Warm and dry NEUROLOGIC:  Alert and oriented x 3 PSYCHIATRIC:  Normal affect   Assessment & Plan Coronary artery disease involving native coronary artery of native heart without angina pectoris The patient is clinically stable with no anginal symptoms.  Since she is on chronic oral anticoagulation, I have recommended that she discontinue aspirin  therapy at this time.  Otherwise no medication changes were made today. Primary hypertension Blood pressure is controlled.  Continue hydralazine  and irbesartan  at current doses. Mixed hyperlipidemia Statin intolerant.  Unable to get PCSK9 or other injectables.  Continue Zetia . Bilateral carotid artery stenosis Status post carotid endarterectomy.  Continue medical therapy.            Medication Adjustments/Labs and Tests Ordered: Current medicines are reviewed at length with the patient today.  Concerns regarding medicines are outlined above.  Orders Placed This Encounter  Procedures   EKG 12-Lead   No orders of the defined types were placed in this encounter.   There are no Patient Instructions on file for this visit.   Signed, Ozell Fell, MD  02/04/2024 2:48 PM    [facility]

## 2024-02-04 NOTE — Patient Instructions (Addendum)
 Medication Instructions:  Your physician has recommended you make the following change in your medication:  STOP: Aspirin   *If you need a refill on your cardiac medications before your next appointment, please call your pharmacy*  Follow-Up: At [facility], you and your health needs are our priority.  As part of our continuing mission to provide you with exceptional heart care, our providers are all part of one team.  This team includes your primary Cardiologist (physician) and Advanced Practice Providers or APPs (Physician Assistants and Nurse Practitioners) who all work together to provide you with the care you need, when you need it.  Your next appointment:   1 year(s)  Provider:   Ozell Fell, MD
